# Patient Record
Sex: Female | Born: 1945 | Race: White | Hispanic: No | Smoking: Former smoker
Health system: Southern US, Community
[De-identification: ages and names within clinical notes are randomized; demographics above are authoritative.]

## PROBLEM LIST (undated history)

## (undated) DIAGNOSIS — I701 Atherosclerosis of renal artery: Secondary | ICD-10-CM

## (undated) DIAGNOSIS — I10 Essential (primary) hypertension: Secondary | ICD-10-CM

## (undated) DIAGNOSIS — I2510 Atherosclerotic heart disease of native coronary artery without angina pectoris: Secondary | ICD-10-CM

## (undated) DIAGNOSIS — F329 Major depressive disorder, single episode, unspecified: Secondary | ICD-10-CM

## (undated) DIAGNOSIS — C3410 Malignant neoplasm of upper lobe, unspecified bronchus or lung: Secondary | ICD-10-CM

## (undated) DIAGNOSIS — Z860100 Personal history of colon polyps, unspecified: Secondary | ICD-10-CM

## (undated) DIAGNOSIS — F32A Depression, unspecified: Secondary | ICD-10-CM

## (undated) DIAGNOSIS — G4733 Obstructive sleep apnea (adult) (pediatric): Secondary | ICD-10-CM

## (undated) DIAGNOSIS — I714 Abdominal aortic aneurysm, without rupture, unspecified: Secondary | ICD-10-CM

## (undated) DIAGNOSIS — M1990 Unspecified osteoarthritis, unspecified site: Secondary | ICD-10-CM

## (undated) DIAGNOSIS — J449 Chronic obstructive pulmonary disease, unspecified: Secondary | ICD-10-CM

## (undated) HISTORY — DX: Major depressive disorder, single episode, unspecified: F32.9

## (undated) HISTORY — PX: ABDOMINAL HYSTERECTOMY: SHX81

## (undated) HISTORY — DX: Malignant neoplasm of upper lobe, unspecified bronchus or lung: C34.10

## (undated) HISTORY — DX: Personal history of colon polyps, unspecified: Z86.0100

## (undated) HISTORY — DX: Abdominal aortic aneurysm, without rupture, unspecified: I71.40

## (undated) HISTORY — DX: Chronic obstructive pulmonary disease, unspecified: J44.9

## (undated) HISTORY — DX: Atherosclerotic heart disease of native coronary artery without angina pectoris: I25.10

## (undated) HISTORY — DX: Unspecified osteoarthritis, unspecified site: M19.90

## (undated) HISTORY — DX: Atherosclerosis of renal artery: I70.1

## (undated) HISTORY — DX: Obstructive sleep apnea (adult) (pediatric): G47.33

## (undated) HISTORY — DX: Abdominal aortic aneurysm, without rupture: I71.4

## (undated) HISTORY — PX: VESICOVAGINAL FISTULA CLOSURE W/ TAH: SUR271

## (undated) HISTORY — DX: Depression, unspecified: F32.A

## (undated) HISTORY — DX: Personal history of colonic polyps: Z86.010

---

## 2004-01-25 LAB — HM COLONOSCOPY

## 2007-03-29 ENCOUNTER — Emergency Department (HOSPITAL_COMMUNITY): Admission: EM | Admit: 2007-03-29 | Discharge: 2007-03-29 | Payer: Self-pay | Admitting: Emergency Medicine

## 2007-09-24 ENCOUNTER — Encounter: Admission: RE | Admit: 2007-09-24 | Discharge: 2007-09-24 | Payer: Self-pay | Admitting: Internal Medicine

## 2009-03-29 ENCOUNTER — Encounter: Admission: RE | Admit: 2009-03-29 | Discharge: 2009-04-27 | Payer: Self-pay | Admitting: Internal Medicine

## 2009-08-28 ENCOUNTER — Encounter: Admission: RE | Admit: 2009-08-28 | Discharge: 2009-08-28 | Payer: Self-pay | Admitting: Cardiovascular Disease

## 2009-08-30 ENCOUNTER — Inpatient Hospital Stay (HOSPITAL_COMMUNITY): Admission: RE | Admit: 2009-08-30 | Discharge: 2009-09-06 | Payer: Self-pay | Admitting: Cardiology

## 2009-08-30 ENCOUNTER — Ambulatory Visit: Payer: Self-pay | Admitting: Cardiothoracic Surgery

## 2009-08-30 HISTORY — PX: CARDIAC CATHETERIZATION: SHX172

## 2009-08-30 HISTORY — PX: CORONARY ARTERY BYPASS GRAFT: SHX141

## 2009-08-30 HISTORY — PX: OTHER SURGICAL HISTORY: SHX169

## 2009-09-26 ENCOUNTER — Ambulatory Visit: Payer: Self-pay | Admitting: Surgery

## 2009-09-26 ENCOUNTER — Encounter: Admission: RE | Admit: 2009-09-26 | Discharge: 2009-09-26 | Payer: Self-pay | Admitting: Surgery

## 2009-10-26 ENCOUNTER — Encounter (HOSPITAL_COMMUNITY): Admission: RE | Admit: 2009-10-26 | Discharge: 2009-11-17 | Payer: Self-pay | Admitting: Cardiovascular Disease

## 2009-11-18 ENCOUNTER — Encounter (HOSPITAL_COMMUNITY): Admission: RE | Admit: 2009-11-18 | Discharge: 2010-02-16 | Payer: Self-pay | Admitting: Cardiovascular Disease

## 2010-02-17 ENCOUNTER — Encounter (HOSPITAL_COMMUNITY): Admission: RE | Admit: 2010-02-17 | Discharge: 2010-03-19 | Payer: Self-pay | Admitting: Cardiovascular Disease

## 2010-06-14 ENCOUNTER — Encounter: Payer: Self-pay | Admitting: Internal Medicine

## 2010-07-29 ENCOUNTER — Encounter: Payer: Self-pay | Admitting: Internal Medicine

## 2010-08-04 ENCOUNTER — Emergency Department (HOSPITAL_COMMUNITY): Admission: EM | Admit: 2010-08-04 | Discharge: 2010-08-04 | Payer: Self-pay | Admitting: Emergency Medicine

## 2010-09-26 ENCOUNTER — Encounter: Payer: Self-pay | Admitting: Internal Medicine

## 2010-10-01 ENCOUNTER — Encounter: Payer: Self-pay | Admitting: Internal Medicine

## 2010-10-08 ENCOUNTER — Encounter: Payer: Self-pay | Admitting: Internal Medicine

## 2010-10-08 ENCOUNTER — Inpatient Hospital Stay (HOSPITAL_COMMUNITY): Admission: RE | Admit: 2010-10-08 | Discharge: 2010-10-11 | Payer: Self-pay | Admitting: Orthopedic Surgery

## 2010-10-26 ENCOUNTER — Inpatient Hospital Stay (HOSPITAL_COMMUNITY)
Admission: AD | Admit: 2010-10-26 | Discharge: 2010-10-30 | Payer: Self-pay | Source: Home / Self Care | Attending: Orthopedic Surgery | Admitting: Orthopedic Surgery

## 2010-10-29 ENCOUNTER — Ambulatory Visit: Payer: Self-pay | Admitting: Internal Medicine

## 2011-01-02 ENCOUNTER — Encounter: Payer: Self-pay | Admitting: Internal Medicine

## 2011-01-02 ENCOUNTER — Encounter (INDEPENDENT_AMBULATORY_CARE_PROVIDER_SITE_OTHER): Payer: Self-pay | Admitting: *Deleted

## 2011-01-02 ENCOUNTER — Institutional Professional Consult (permissible substitution) (INDEPENDENT_AMBULATORY_CARE_PROVIDER_SITE_OTHER): Payer: MEDICARE | Admitting: Internal Medicine

## 2011-01-02 ENCOUNTER — Other Ambulatory Visit: Payer: Self-pay | Admitting: Internal Medicine

## 2011-01-02 ENCOUNTER — Ambulatory Visit (INDEPENDENT_AMBULATORY_CARE_PROVIDER_SITE_OTHER): Payer: Medicare Other | Admitting: Internal Medicine

## 2011-01-02 ENCOUNTER — Other Ambulatory Visit: Payer: Medicare Other

## 2011-01-02 DIAGNOSIS — Z860100 Personal history of colon polyps, unspecified: Secondary | ICD-10-CM | POA: Insufficient documentation

## 2011-01-02 DIAGNOSIS — E119 Type 2 diabetes mellitus without complications: Secondary | ICD-10-CM

## 2011-01-02 DIAGNOSIS — E1129 Type 2 diabetes mellitus with other diabetic kidney complication: Secondary | ICD-10-CM | POA: Insufficient documentation

## 2011-01-02 DIAGNOSIS — F329 Major depressive disorder, single episode, unspecified: Secondary | ICD-10-CM | POA: Insufficient documentation

## 2011-01-02 DIAGNOSIS — F3289 Other specified depressive episodes: Secondary | ICD-10-CM | POA: Insufficient documentation

## 2011-01-02 DIAGNOSIS — F068 Other specified mental disorders due to known physiological condition: Secondary | ICD-10-CM | POA: Insufficient documentation

## 2011-01-02 DIAGNOSIS — I10 Essential (primary) hypertension: Secondary | ICD-10-CM

## 2011-01-02 DIAGNOSIS — J449 Chronic obstructive pulmonary disease, unspecified: Secondary | ICD-10-CM | POA: Insufficient documentation

## 2011-01-02 DIAGNOSIS — J4489 Other specified chronic obstructive pulmonary disease: Secondary | ICD-10-CM | POA: Insufficient documentation

## 2011-01-02 DIAGNOSIS — E785 Hyperlipidemia, unspecified: Secondary | ICD-10-CM

## 2011-01-02 DIAGNOSIS — J984 Other disorders of lung: Secondary | ICD-10-CM

## 2011-01-02 DIAGNOSIS — M1990 Unspecified osteoarthritis, unspecified site: Secondary | ICD-10-CM | POA: Insufficient documentation

## 2011-01-02 DIAGNOSIS — G4733 Obstructive sleep apnea (adult) (pediatric): Secondary | ICD-10-CM | POA: Insufficient documentation

## 2011-01-02 DIAGNOSIS — R911 Solitary pulmonary nodule: Secondary | ICD-10-CM

## 2011-01-02 DIAGNOSIS — E1165 Type 2 diabetes mellitus with hyperglycemia: Secondary | ICD-10-CM

## 2011-01-02 LAB — BASIC METABOLIC PANEL
BUN: 12 mg/dL (ref 6–23)
CO2: 27 mEq/L (ref 19–32)
Calcium: 10.3 mg/dL (ref 8.4–10.5)
Glucose, Bld: 100 mg/dL — ABNORMAL HIGH (ref 70–99)
Sodium: 140 mEq/L (ref 135–145)

## 2011-01-02 LAB — HEPATIC FUNCTION PANEL
ALT: 18 U/L (ref 0–35)
Alkaline Phosphatase: 54 U/L (ref 39–117)
Bilirubin, Direct: 0.1 mg/dL (ref 0.0–0.3)
Total Bilirubin: 0.6 mg/dL (ref 0.3–1.2)

## 2011-01-02 LAB — CONVERTED CEMR LAB
HDL goal, serum: 40 mg/dL
LDL Goal: 70 mg/dL

## 2011-01-03 LAB — CBC WITH DIFFERENTIAL/PLATELET
Eosinophils Relative: 0.9 % (ref 0.0–5.0)
Lymphocytes Relative: 30.1 % (ref 12.0–46.0)
Monocytes Absolute: 0.4 10*3/uL (ref 0.1–1.0)
Monocytes Relative: 5.1 % (ref 3.0–12.0)
Neutrophils Relative %: 63.5 % (ref 43.0–77.0)
Platelets: 234 10*3/uL (ref 150.0–400.0)
RBC: 4.87 Mil/uL (ref 3.87–5.11)
WBC: 7.2 10*3/uL (ref 4.5–10.5)

## 2011-01-04 ENCOUNTER — Other Ambulatory Visit: Payer: Medicare Other

## 2011-01-04 ENCOUNTER — Ambulatory Visit (INDEPENDENT_AMBULATORY_CARE_PROVIDER_SITE_OTHER)
Admission: RE | Admit: 2011-01-04 | Discharge: 2011-01-04 | Disposition: A | Discharge status: Home / Self Care | Payer: Medicare Other | Source: Ambulatory Visit | Attending: Internal Medicine | Admitting: Internal Medicine

## 2011-01-04 DIAGNOSIS — J984 Other disorders of lung: Secondary | ICD-10-CM

## 2011-01-04 DIAGNOSIS — R911 Solitary pulmonary nodule: Secondary | ICD-10-CM

## 2011-01-04 HISTORY — DX: Essential (primary) hypertension: I10

## 2011-01-04 MED ORDER — IOHEXOL 300 MG/ML  SOLN
80.0000 mL | Freq: Once | INTRAMUSCULAR | Status: AC | PRN
  Administered 2011-01-04: 80 mL via INTRAVENOUS

## 2011-01-06 ENCOUNTER — Encounter: Payer: Self-pay | Admitting: Internal Medicine

## 2011-01-08 ENCOUNTER — Telehealth: Payer: Self-pay | Admitting: Internal Medicine

## 2011-01-08 ENCOUNTER — Other Ambulatory Visit: Payer: Self-pay | Admitting: Internal Medicine

## 2011-01-08 DIAGNOSIS — Z1231 Encounter for screening mammogram for malignant neoplasm of breast: Secondary | ICD-10-CM

## 2011-01-08 DIAGNOSIS — R918 Other nonspecific abnormal finding of lung field: Secondary | ICD-10-CM

## 2011-01-09 NOTE — Assessment & Plan Note (Signed)
Summary: NEW MEDICARE PT--#--PKG--STC   Vital Signs:  Patient profile:   65 year old female Menstrual status:  hysterectomy Height:      62 inches Weight:      194.25 pounds BMI:     35.66 O2 Sat:      96 % on Room air Temp:     98.4 degrees F oral Pulse rate:   63 / minute Pulse rhythm:   regular Resp:     14 per minute BP sitting:   132 / 64  (left arm)  Vitals Entered By: Burnard Leigh CMA(AAMA) (January 02, 2011 10:57 AM)  Nutrition Counseling: Patient's BMI is greater than 25 and therefore counseled on weight management options.  O2 Flow:  Room air CC: New Pt transfer to establisn/sls, cma, Hypertension Management, Preventive Care, Lipid Management Is Patient Diabetic? Yes Did you bring your meter with you today? No Pain Assessment Patient in pain? no          Menstrual Status hysterectomy   Primary Care Provider:  jones  CC:  New Pt transfer to establisn/sls, cma, Hypertension Management, Preventive Care, and Lipid Management.  History of Present Illness:  Follow-Up Visit      This is a 65 year old woman who presents for Follow-up visit.  The patient denies chest pain, palpitations, dizziness, syncope, low blood sugar symptoms, high blood sugar symptoms, edema, SOB, DOE, PND, and orthopnea.  Since the last visit the patient notes no new problems or concerns.  The patient reports taking meds as prescribed, monitoring BP, monitoring blood sugars, and dietary compliance.  When questioned about possible medication side effects, the patient notes none.    Hypertension History:      She denies headache, chest pain, palpitations, dyspnea with exertion, orthopnea, PND, peripheral edema, visual symptoms, neurologic problems, syncope, and side effects from treatment.  She notes no problems with any antihypertensive medication side effects.        Positive major cardiovascular risk factors include female age 67 years old or older, diabetes, hyperlipidemia, and hypertension.   Negative major cardiovascular risk factors include non-tobacco-user status.        Positive history for target organ damage include ASHD (either angina/prior MI/prior CABG).  Further assessment for target organ damage reveals no history of cardiac end-organ damage (CHF/LVH), stroke/TIA, peripheral vascular disease, renal insufficiency, or hypertensive retinopathy.    Lipid Management History:      Positive NCEP/ATP III risk factors include female age 67 years old or older, diabetes, hypertension, and ASHD (either angina/prior MI/prior CABG).  Negative NCEP/ATP III risk factors include non-tobacco-user status, no prior stroke/TIA, no peripheral vascular disease, and no history of aortic aneurysm.        The patient states that she knows about the "Therapeutic Lifestyle Change" diet.  Her compliance with the TLC diet is poor.  The patient expresses understanding of adjunctive measures for cholesterol lowering.  Adjunctive measures started by the patient include fiber, niacin, omega-3 supplements, limit alcohol consumpton, and weight reduction.  She expresses no side effects from her lipid-lowering medication.  The patient denies any symptoms to suggest myopathy or liver disease.      Preventive Screening-Counseling & Management  Alcohol-Tobacco     Alcohol drinks/day: 0     Alcohol Counseling: not indicated; patient does not drink     Smoking Status: quit > 6 months     Year Quit: 2004     Tobacco Counseling: to remain off tobacco products  Caffeine-Diet-Exercise  Does Patient Exercise: no  Hep-HIV-STD-Contraception     Hepatitis Risk: no risk noted     HIV Risk: no risk noted     STD Risk: no risk noted      Sexual History:  not active.        Drug Use:  no.        Blood Transfusions:  no.    Current Medications (verified): 1)  Multivitamins  Tabs (Multiple Vitamin) .... Take 1 By Mouth Once Daily 2)  Potassium 99 Mg Tabs (Potassium) .... Take 1 By Mouth Once Daily 3)  Diovan 160  Mg Tabs (Valsartan) .... Take 1 By Mouth Once Daily 4)  Bystolic 10 Mg Tabs (Nebivolol Hcl) .... Take 1 By Mouth Once Daily 5)  Cymbalta 60 Mg Cpep (Duloxetine Hcl) .... Take 1 By Mouth Once Daily With 30mg  6)  Cymbalta 30 Mg Cpep (Duloxetine Hcl) .... Take 1 By Mouth Once Daily 7)  Metformin Hcl 500 Mg Tabs (Metformin Hcl) .... Take 1 By Mouth Two Times A Day 8)  Zetia 10 Mg Tabs (Ezetimibe) .... Take 1 By Mouth Once Daily 9)  Crestor 20 Mg Tabs (Rosuvastatin Calcium) .... Take 1 By Mouth Once Daily 10)  Niaspan 1000 Mg Cr-Tabs (Niacin (Antihyperlipidemic)) .... Take 1 By Mouth Once Daily 11)  Aspirin 81 Mg Tbec (Aspirin) .... Take 1 By Mouth Once Daily 12)  Vitamin C 500 Mg Tabs (Ascorbic Acid) .... Take 1 By Mouth Two Times A Day 13)  Proair Hfa 108 (90 Base) Mcg/act Aers (Albuterol Sulfate) .... 2 Puffs Four Times A Day As Needed 14)  Symbicort 160-4.5 Mcg/act Aero (Budesonide-Formoterol Fumarate) .... 2 Puffs Two Times A Day and Rinse Mouth After Use  Allergies (verified): No Known Drug Allergies  Past History:  Past Medical History: Colonic polyps, hx of COPD Dementia Depression Diabetes mellitus, type II Hyperlipidemia Hypertension Osteoarthritis AAA OSA RAS  Past Surgical History: Coronary artery bypass graft Multiple I and D's left hand s/p infected cat bite left hand November 2011 Hysterectomy  Family History: Family History of Arthritis Family History Depression Family History High cholesterol Family History Hypertension  Social History: Retired Divorced Alcohol use-no Drug use-no Regular exercise-no Drug Use:  no Does Patient Exercise:  no Smoking Status:  quit > 6 months Hepatitis Risk:  no risk noted HIV Risk:  no risk noted STD Risk:  no risk noted Sexual History:  not active Blood Transfusions:  no  Review of Systems       The patient complains of weight gain.  The patient denies anorexia, fever, weight loss, chest pain, syncope, dyspnea on  exertion, peripheral edema, prolonged cough, headaches, hemoptysis, abdominal pain, hematuria, muscle weakness, suspicious skin lesions, transient blindness, difficulty walking, enlarged lymph nodes, and angioedema.   Neuro:  Complains of memory loss; denies difficulty with concentration, disturbances in coordination, falling down, headaches, inability to speak, seizures, tingling, tremors, visual disturbances, and weakness. Endo:  Denies cold intolerance, excessive hunger, excessive thirst, excessive urination, heat intolerance, polyuria, and weight change.  Physical Exam  General:  alert, well-developed, well-nourished, well-hydrated, appropriate dress, normal appearance, healthy-appearing, cooperative to examination, good hygiene, and overweight-appearing.   Head:  normocephalic, atraumatic, no abnormalities observed, and no abnormalities palpated.   Eyes:  vision grossly intact and pupils equal.   Mouth:  Oral mucosa and oropharynx without lesions or exudates.  Teeth in good repair. Neck:  supple, full ROM, no masses, no thyromegaly, no thyroid nodules or tenderness, no JVD, normal carotid upstroke,  no carotid bruits, no cervical lymphadenopathy, and no neck tenderness.   Lungs:  normal respiratory effort, no intercostal retractions, no accessory muscle use, normal breath sounds, no dullness, no fremitus, no crackles, and no wheezes.   Heart:  normal rate, regular rhythm, no murmur, no gallop, no rub, and no JVD.   Abdomen:  soft, non-tender, normal bowel sounds, no distention, no masses, no guarding, no rigidity, no rebound tenderness, no abdominal hernia, no inguinal hernia, no hepatomegaly, and no splenomegaly.   Msk:  scab and scar left hand dorsum Pulses:  R and L carotid,radial,femoral,dorsalis pedis and posterior tibial pulses are full and equal bilaterally Extremities:  No clubbing, cyanosis, edema, or deformity noted with normal full range of motion of all joints.   Neurologic:  No  cranial nerve deficits noted. Station and gait are normal. Plantar reflexes are down-going bilaterally. DTRs are symmetrical throughout. Sensory, motor and coordinative functions appear intact. Skin:  Intact without suspicious lesions or rashes Cervical Nodes:  no anterior cervical adenopathy and no posterior cervical adenopathy.   Axillary Nodes:  no R axillary adenopathy and no L axillary adenopathy.   Psych:  Cognition and judgment appear intact. Alert and cooperative with normal attention span and concentration. No apparent delusions, illusions, hallucinations  Diabetes Management Exam:    Foot Exam (with socks and/or shoes not present):       Sensory-Pinprick/Light touch:          Left medial foot (L-4): normal          Left dorsal foot (L-5): normal          Left lateral foot (S-1): normal          Right medial foot (L-4): normal          Right dorsal foot (L-5): normal          Right lateral foot (S-1): normal       Sensory-Monofilament:          Left foot: normal          Right foot: normal       Inspection:          Left foot: normal          Right foot: normal       Nails:          Left foot: normal          Right foot: normal   Impression & Recommendations:  Problem # 1:  HYPERTENSION (ICD-401.9) Assessment Improved  Her updated medication list for this problem includes:    Diovan 160 Mg Tabs (Valsartan) .Marland Kitchen... Take 1 by mouth once daily    Bystolic 10 Mg Tabs (Nebivolol hcl) .Marland Kitchen... Take 1 by mouth once daily  Orders: Venipuncture (16109) TLB-CBC Platelet - w/Differential (85025-CBCD) TLB-Hepatic/Liver Function Pnl (80076-HEPATIC) TLB-TSH (Thyroid Stimulating Hormone) (84443-TSH) TLB-BMP (Basic Metabolic Panel-BMET) (80048-METABOL) TLB-A1C / Hgb A1C (Glycohemoglobin) (83036-A1C)  BP today: 132/64  10 Yr Risk Heart Disease: N/A  Problem # 2:  DIABETES MELLITUS, TYPE II (ICD-250.00) Assessment: Improved  Her updated medication list for this problem includes:     Diovan 160 Mg Tabs (Valsartan) .Marland Kitchen... Take 1 by mouth once daily    Metformin Hcl 500 Mg Tabs (Metformin hcl) .Marland Kitchen... Take 1 by mouth two times a day    Aspirin 81 Mg Tbec (Aspirin) .Marland Kitchen... Take 1 by mouth once daily  Orders: Venipuncture (60454) TLB-CBC Platelet - w/Differential (85025-CBCD) TLB-Hepatic/Liver Function Pnl (80076-HEPATIC) TLB-TSH (Thyroid Stimulating Hormone) (84443-TSH) TLB-BMP (  Basic Metabolic Panel-BMET) (80048-METABOL) TLB-A1C / Hgb A1C (Glycohemoglobin) (83036-A1C) Ophthalmology Referral (Ophthalmology)  Problem # 3:  HYPERLIPIDEMIA (ICD-272.4) Assessment: Unchanged  Her updated medication list for this problem includes:    Zetia 10 Mg Tabs (Ezetimibe) .Marland Kitchen... Take 1 by mouth once daily    Crestor 20 Mg Tabs (Rosuvastatin calcium) .Marland Kitchen... Take 1 by mouth once daily    Niaspan 1000 Mg Cr-tabs (Niacin (antihyperlipidemic)) .Marland Kitchen... Take 1 by mouth once daily  Orders: Venipuncture (16109) TLB-CBC Platelet - w/Differential (85025-CBCD) TLB-Hepatic/Liver Function Pnl (80076-HEPATIC) TLB-TSH (Thyroid Stimulating Hormone) (84443-TSH) TLB-BMP (Basic Metabolic Panel-BMET) (80048-METABOL) TLB-A1C / Hgb A1C (Glycohemoglobin) (83036-A1C)  10 Yr Risk Heart Disease: N/A  Complete Medication List: 1)  Multivitamins Tabs (Multiple vitamin) .... Take 1 by mouth once daily 2)  Potassium 99 Mg Tabs (Potassium) .... Take 1 by mouth once daily 3)  Diovan 160 Mg Tabs (Valsartan) .... Take 1 by mouth once daily 4)  Bystolic 10 Mg Tabs (Nebivolol hcl) .... Take 1 by mouth once daily 5)  Cymbalta 60 Mg Cpep (Duloxetine hcl) .... Take 1 by mouth once daily with 30mg  6)  Cymbalta 30 Mg Cpep (Duloxetine hcl) .... Take 1 by mouth once daily 7)  Metformin Hcl 500 Mg Tabs (Metformin hcl) .... Take 1 by mouth two times a day 8)  Zetia 10 Mg Tabs (Ezetimibe) .... Take 1 by mouth once daily 9)  Crestor 20 Mg Tabs (Rosuvastatin calcium) .... Take 1 by mouth once daily 10)  Niaspan 1000 Mg Cr-tabs  (Niacin (antihyperlipidemic)) .... Take 1 by mouth once daily 11)  Aspirin 81 Mg Tbec (Aspirin) .... Take 1 by mouth once daily 12)  Vitamin C 500 Mg Tabs (Ascorbic acid) .... Take 1 by mouth two times a day 13)  Proair Hfa 108 (90 Base) Mcg/act Aers (Albuterol sulfate) .... 2 puffs four times a day as needed 14)  Symbicort 160-4.5 Mcg/act Aero (Budesonide-formoterol fumarate) .... 2 puffs two times a day and rinse mouth after use  Other Orders: Radiology Referral (Radiology)  Hypertension Assessment/Plan:      The patient's hypertensive risk group is category C: Target organ damage and/or diabetes.  Today's blood pressure is 132/64.  Her blood pressure goal is < 130/80.  Lipid Assessment/Plan:      Based on NCEP/ATP III, the patient's risk factor category is "history of coronary disease, peripheral vascular disease, cerebrovascular disease, or aortic aneurysm along with either diabetes, current smoker, or LDL > 130 plus HDL < 40 plus triglycerides > 200".  The patient's lipid goals are as follows: Total cholesterol goal is 200; LDL cholesterol goal is 70; HDL cholesterol goal is 40; Triglyceride goal is 150.    Colorectal Screening:  Current Recommendations:    Colonoscopy recommended: patient defers today but will consider in the future  Colonoscopy Results:    Date of Exam: 01/25/2004    Results: Adenomatous Polyp  PAP Screening:    Hx Cervical Dysplasia in last 5 yrs? No    3 normal PAP smears in last 5 yrs? No    Reviewed PAP smear recommendations:  patient refuses understanding risks of delayed diagnosis  Mammogram Screening:    Reviewed Mammogram recommendations:  mammogram ordered  Osteoporosis Risk Assessment:  Risk Factors for Fracture or Low Bone Density:   Race (White or Asian):     yes   Smoking status:       quit > 6 months  Immunization & Chemoprophylaxis:    Influenza vaccine: Historical  (07/19/2009)  Pneumovax: Historical  (07/19/2010)  Patient  Instructions: 1)  Please schedule a follow-up appointment in 4 months. 2)  It is important that you exercise regularly at least 20 minutes 5 times a week. If you develop chest pain, have severe difficulty breathing, or feel very tired , stop exercising immediately and seek medical attention. 3)  You need to lose weight. Consider a lower calorie diet and regular exercise.  4)  Schedule your mammogram. 5)  You need to have a Pap Smear to prevent cervical cancer. 6)  Check your blood sugars regularly. If your readings are usually above 200 or below 70 you should contact our office. 7)  It is important that your Diabetic A1c level is checked every 3 months. 8)  See your eye doctor yearly to check for diabetic eye damage. 9)  Check your feet each night for sore areas, calluses or signs of infection. 10)  Check your Blood Pressure regularly. If it is above 130/80: you should make an appointment.   Orders Added: 1)  Venipuncture [36415] 2)  TLB-CBC Platelet - w/Differential [85025-CBCD] 3)  TLB-Hepatic/Liver Function Pnl [80076-HEPATIC] 4)  TLB-TSH (Thyroid Stimulating Hormone) [84443-TSH] 5)  TLB-BMP (Basic Metabolic Panel-BMET) [80048-METABOL] 6)  TLB-A1C / Hgb A1C (Glycohemoglobin) [83036-A1C] 7)  Ophthalmology Referral [Ophthalmology] 8)  Radiology Referral [Radiology] 9)  New Patient Level III [04540]   Immunization History:  Influenza Immunization History:    Influenza:  historical (07/19/2009)  Pneumovax Immunization History:    Pneumovax:  historical (07/19/2010)   Immunization History:  Influenza Immunization History:    Influenza:  Historical (07/19/2009)  Pneumovax Immunization History:    Pneumovax:  Historical (07/19/2010)

## 2011-01-09 NOTE — Assessment & Plan Note (Signed)
Summary: consult COPD/sh   Primary Provider/Referring Provider:  [name]  CC:  Pulmonary New Patient-SOB.  History of Present Illness: January 02, 2011- Pulmonary New Patient-SOB. 65 Yo F coming today with her daughter- a very helpful nurse at Dr Hazle Coca office, on kind referral from Dr Allyson Sabal for pulmonary evaluation of COPD and a lung nodule.  Former long-time smoker, dx'd with COPD in 1997 on hospitalization for acute exacerbation,  Aware of shortness of breath x severa years. but no actual evaluation. Worse in cold weather. DOE 100 feet or few steps or stairs. Little cough- dry, without wheeze, edema or chest pain. Recently hospitalized to deal with hand infected after cat bite. Anesthesiologist told her breathing was bad and she needed to see a pulmonary doctor. CXR 10/08/10- emphysema; RUL nodule>>recommend CT.  Using Symbicort 160, Proair. Spiriva helped but can't afford it.  Past hx pneumonia. Has had pneumovax more than once, but missed flu shot this year.  Seasonal allergic rhinitis only in childhood.  Hx obstructive sleep apnea, on BiPAP 15/10/ SMS, used all night, every night. Full face mask- Dr Tresa Endo. Coronary disease, no MI. Had CABG 2 v after interventional perf RCA. EF > 50%.   Preventive Screening-Counseling & Management  Alcohol-Tobacco     Smoking Status: quit     Packs/Day: 2.0     Year Started: 1962     Year Quit: 2004  Current Medications (verified): 1)  Multivitamins  Tabs (Multiple Vitamin) .... Take 1 By Mouth Once Daily 2)  Potassium 99 Mg Tabs (Potassium) .... Take 1 By Mouth Once Daily 3)  Diovan 160 Mg Tabs (Valsartan) .... Take 1 By Mouth Once Daily 4)  Bystolic 10 Mg Tabs (Nebivolol Hcl) .... Take 1 By Mouth Once Daily 5)  Cymbalta 60 Mg Cpep (Duloxetine Hcl) .... Take 1 By Mouth Once Daily With 30mg  6)  Cymbalta 30 Mg Cpep (Duloxetine Hcl) .... Take 1 By Mouth Once Daily 7)  Metformin Hcl 500 Mg Tabs (Metformin Hcl) .... Take 1 By Mouth Two Times  A Day 8)  Zetia 10 Mg Tabs (Ezetimibe) .... Take 1 By Mouth Once Daily 9)  Crestor 20 Mg Tabs (Rosuvastatin Calcium) .... Take 1 By Mouth Once Daily 10)  Niaspan 1000 Mg Cr-Tabs (Niacin (Antihyperlipidemic)) .... Take 1 By Mouth Once Daily 11)  Aspirin 81 Mg Tbec (Aspirin) .... Take 1 By Mouth Once Daily 12)  Vitamin C 500 Mg Tabs (Ascorbic Acid) .... Take 1 By Mouth Two Times A Day 13)  Proair Hfa 108 (90 Base) Mcg/act Aers (Albuterol Sulfate) .... 2 Puffs Four Times A Day As Needed 14)  Symbicort 160-4.5 Mcg/act Aero (Budesonide-Formoterol Fumarate) .... 2 Puffs Two Times A Day and Rinse Mouth After Use  Allergies (verified): No Known Drug Allergies  Past History:  Family History: Last updated: 01/02/2011 Emphysema-grandfather Mother- died MI Father- living, has RA  Social History: Last updated: 01/02/2011 Divorced; children- daughter is nurse for Dr Allyson Sabal cardiology Retired Catering manager- disabled by COPD, [facility] Living alone Patient states former smoker.   Risk Factors: Smoking Status: quit (01/02/2011) Packs/Day: 2.0 (01/02/2011)  Past Medical History: COPD Lung nodule- RUL- CT 01/04/11 CAD- CABG 2v 2010 Renal artery stenosis AAA Diabetes, Type 2 Hyperlipidemia Hypertension Obstructive sleep  apnea- managed by Dr Tresa Endo Depression  Past Surgical History: CABG 2v 2010 Total Abdominal Hysterectomy Right hand surgery x 4 after cat bite 2012  Family History: Emphysema-grandfather Mother- died MI Father- living, has RA  Social History: Divorced; children- daughter is  nurse for Dr Allyson Sabal cardiology Retired bookkeeper- disabled by COPD, [facility] Living alone Patient states former smoker.  Smoking Status:  quit Packs/Day:  2.0  Review of Systems      See HPI       The patient complains of shortness of breath with activity, productive cough, headaches, depression, hand/feet swelling, and joint stiffness or pain.  The patient denies shortness of breath at  rest, non-productive cough, coughing up blood, chest pain, irregular heartbeats, acid heartburn, indigestion, loss of appetite, weight change, abdominal pain, difficulty swallowing, sore throat, tooth/dental problems, nasal congestion/difficulty breathing through nose, sneezing, itching, ear ache, anxiety, rash, change in color of mucus, and fever.    Vital Signs:  Patient profile:   65 year old female Height:      64.5 inches Weight:      195.38 pounds BMI:     33.14 O2 Sat:      97 % on Room air Pulse rate:   61 / minute BP sitting:   124 / 76  (left arm) Cuff size:   regular  Vitals Entered By: Reynaldo Minium CMA (January 02, 2011 9:31 AM)  O2 Flow:  Room air CC: Pulmonary New Patient-SOB   Physical Exam  Additional Exam:  General: A/Ox3; pleasant and cooperative, NAD, overweight   RA O2 sat 97% at rest SKIN: bandage dorsum left hand NODES: no lymphadenopathy HEENT: [place]/AT, EOM- WNL, Conjuctivae- clear, PERRLA, TM-WNL, Nose- clear, Throat- clear and wnl, own teeth, tonsils, Mallampati  II NECK: Supple w/ fair ROM, JVD- none, normal carotid impulses w/o bruits Thyroid- normal to palpation CHEST: decreased breath sounds, raspy cough HEART: RRR, no m/g/r heard ABDOMEN: Soft and nl; nml bowel sounds; no organomegaly or masses noted FAO:ZHYQ, nl pulses, trace edema  NEURO: Grossly intact to observation- reportedly decreased memory      Impression & Recommendations:  Problem # 1:  COPD (ICD-496) Hx and exam support a dx of COPD- emphysema pattern. She noted real benefit from Spiriva but couldn't afford it a year ago.We will reassess, and see if there is assistance available. We will get PFT and refer to Pulmonary Rehab, noting that she had done cardiac rehab in the past. She has been using Symbicort only once daily due to cost. I suggested that she go back up to two times a day any time at first sign of a chest cold or etc.   Problem # 2:  LUNG NODULE (ICD-518.89)  Nodule  first described on CXR 10/08/10 in R[facility]. We will get CT. Daughter says renal function normal in November- pertinent to contrast.  Medications Added to Medication List This Visit: 1)  Multivitamins Tabs (Multiple vitamin) .... Take 1 by mouth once daily 2)  Potassium 99 Mg Tabs (Potassium) .... Take 1 by mouth once daily 3)  Diovan 160 Mg Tabs (Valsartan) .... Take 1 by mouth once daily 4)  Bystolic 10 Mg Tabs (Nebivolol hcl) .... Take 1 by mouth once daily 5)  Cymbalta 60 Mg Cpep (Duloxetine hcl) .... Take 1 by mouth once daily with 30mg  6)  Cymbalta 30 Mg Cpep (Duloxetine hcl) .... Take 1 by mouth once daily 7)  Metformin Hcl 500 Mg Tabs (Metformin hcl) .... Take 1 by mouth two times a day 8)  Zetia 10 Mg Tabs (Ezetimibe) .... Take 1 by mouth once daily 9)  Crestor 20 Mg Tabs (Rosuvastatin calcium) .... Take 1 by mouth once daily 10)  Niaspan 1000 Mg Cr-tabs (Niacin (antihyperlipidemic)) .... Take 1 by  mouth once daily 11)  Aspirin 81 Mg Tbec (Aspirin) .... Take 1 by mouth once daily 12)  Vitamin C 500 Mg Tabs (Ascorbic acid) .... Take 1 by mouth two times a day 13)  Proair Hfa 108 (90 Base) Mcg/act Aers (Albuterol sulfate) .... 2 puffs four times a day as needed 14)  Symbicort 160-4.5 Mcg/act Aero (Budesonide-formoterol fumarate) .... 2 puffs two times a day and rinse mouth after use 15)  Spiriva Handihaler 18 Mcg Caps (Tiotropium bromide monohydrate) .Marland Kitchen.. 1 daily  Other Orders: Consultation Level IV (99244) TLB-BMP (Basic Metabolic Panel-BMET) (80048-METABOL) DME Referral (DME) Radiology Referral (Radiology) Rehabilitation Referral (Rehab)  Patient Instructions: 1)  Please schedule a follow-up appointment in 1 month. 2)  Sample and script for Spiriva- 1 daily 3)  We will see if you could qualify for any assistance with this.  4)  See [facility] to see about Pulmonary rehab, availability of assistance with Spiriva and to schedule the CT 5)  A Chest CT with Contrast has been recommended.   Your imaging study may require preauthorization.  6)  Schedule PFT 7)  If you are having a bad stretch with your breathing, like a chest cold or etc, you can go back up to twice daily with Symbicort until you feel better, then drop back to once daily. 8)  cc Dr Tresa Endo Prescriptions: SPIRIVA HANDIHALER 18 MCG CAPS (TIOTROPIUM BROMIDE MONOHYDRATE) 1 daily  #30 x prn   Entered and Authorized by:   Waymon Budge MD   Signed by:   Waymon Budge MD on 01/02/2011   Method used:   Print then Give to Patient   RxID:   [id number]

## 2011-01-10 ENCOUNTER — Encounter (INDEPENDENT_AMBULATORY_CARE_PROVIDER_SITE_OTHER): Payer: Medicare Other

## 2011-01-10 ENCOUNTER — Encounter: Payer: Self-pay | Admitting: Internal Medicine

## 2011-01-10 DIAGNOSIS — J449 Chronic obstructive pulmonary disease, unspecified: Secondary | ICD-10-CM

## 2011-01-13 ENCOUNTER — Telehealth: Payer: Self-pay | Admitting: Internal Medicine

## 2011-01-15 ENCOUNTER — Encounter: Payer: Self-pay | Admitting: Internal Medicine

## 2011-01-15 NOTE — Letter (Signed)
Summary: Medical Hx/Patient  Medical Hx/Patient   Imported By: Sherian Rein 01/10/2011 09:16:59  _____________________________________________________________________  External Attachment:    Type:   Image     Comment:   External Document

## 2011-01-15 NOTE — Progress Notes (Signed)
Summary: Daughter/ Pt  to discuss lung nodule and order PET  Phone Note Other Incoming   Summary of Call: The daughter, Rosana Berger, RN, who was with mother at initial visit, called asking discussion of CT. We went over results and explanation of report. I suggested we go ahead and schedule PET ahead of the next visit schecduled OV March 16. I called patient to offer same recommendation. She agrees. Initial call taken by: Waymon Budge MD,  January 08, 2011 12:57 PM

## 2011-01-15 NOTE — Letter (Signed)
Summary: [facility]  [facility]   Imported By: Sherian Rein 01/10/2011 09:15:46  _____________________________________________________________________  External Attachment:    Type:   Image     Comment:   External Document

## 2011-01-15 NOTE — Letter (Signed)
Summary: Results Follow-up Letter  [facility]  [address] [place], [place] [address]   Phone: [phone]  Fax: [phone]    01/06/2011  [address] [place], [place]  [address]  [place]  Dear Ms. KESSELMAN,   The following are the results of your recent test(s):  Test     Result     CBC       mildly anemia Liver/kidney   normal Thyroid     normal  _________________________________________________________  Please call for an appointment soon _________________________________________________________ _________________________________________________________ _________________________________________________________  Sincerely,  [name] MD [facility]

## 2011-01-15 NOTE — Progress Notes (Signed)
Summary: would like to speak to the nurse re:her mothers ct results  Phone Note Call from Patient   Caller: daughter/katherine vogel Call For: Oumou Smead Summary of Call: patient daughter Rosana Berger phoned stated that Florentina Addison had called her mother with her Ct result and she would like to speak to [facility] regarding this She can be reached at [phone] Initial call taken by: Vedia Coffer,  January 08, 2011 10:18 AM  Follow-up for Phone Call        PT 's daughter would like Dr Ysidra Sopher to call her regarding CT results.  She has a few questions before pt's appt on 02-01-11. Abigail Miyamoto RN  January 08, 2011 10:57 AM   Additional Follow-up for Phone Call Additional follow up Details #1::        Done Additional Follow-up by: Waymon Budge MD,  January 08, 2011 1:03 PM

## 2011-01-21 ENCOUNTER — Encounter (HOSPITAL_COMMUNITY)
Admission: RE | Admit: 2011-01-21 | Discharge: 2011-01-21 | Disposition: A | Discharge status: Home / Self Care | Payer: Medicare Other | Source: Ambulatory Visit | Attending: Internal Medicine | Admitting: Internal Medicine

## 2011-01-21 DIAGNOSIS — R918 Other nonspecific abnormal finding of lung field: Secondary | ICD-10-CM

## 2011-01-21 DIAGNOSIS — C3410 Malignant neoplasm of upper lobe, unspecified bronchus or lung: Secondary | ICD-10-CM | POA: Insufficient documentation

## 2011-01-21 DIAGNOSIS — J449 Chronic obstructive pulmonary disease, unspecified: Secondary | ICD-10-CM | POA: Insufficient documentation

## 2011-01-21 DIAGNOSIS — J4489 Other specified chronic obstructive pulmonary disease: Secondary | ICD-10-CM | POA: Insufficient documentation

## 2011-01-21 MED ORDER — FLUDEOXYGLUCOSE F - 18 (FDG) INJECTION
15.6000 | Freq: Once | INTRAVENOUS | Status: AC | PRN
  Administered 2011-01-21: 15.6 via INTRAVENOUS

## 2011-01-23 ENCOUNTER — Ambulatory Visit
Admission: RE | Admit: 2011-01-23 | Discharge: 2011-01-23 | Disposition: A | Discharge status: Home / Self Care | Payer: Medicare Other | Source: Ambulatory Visit | Attending: Internal Medicine | Admitting: Internal Medicine

## 2011-01-23 DIAGNOSIS — Z1231 Encounter for screening mammogram for malignant neoplasm of breast: Secondary | ICD-10-CM

## 2011-01-23 LAB — HM MAMMOGRAPHY: HM Mammogram: NORMAL

## 2011-01-24 ENCOUNTER — Telehealth: Payer: Self-pay | Admitting: Internal Medicine

## 2011-01-24 NOTE — Progress Notes (Signed)
Summary: SMS reports patient using BiPAP 15/10  Phone Note Other Incoming   Summary of Call: SMS reports patient is fully compliant with BIPAP 15/10, AHI 0/hr. Initial call taken by: Waymon Budge MD,  January 13, 2011 12:02 PM    New/Updated Medications: * BIPAP 15/10 SMS

## 2011-01-24 NOTE — Assessment & Plan Note (Signed)
Summary: pft //kp   Allergies: No Known Drug Allergies   Other Orders: Carbon Monoxide diffusing w/capacity ([address]) Lung Volumes/Gas dilution or washout ([address]) Spirometry (Pre & Post) [phone])

## 2011-01-24 NOTE — Letter (Signed)
Summary: [facility]  [facility]   Imported By: Sherian Rein 01/17/2011 12:58:52  _____________________________________________________________________  External Attachment:    Type:   Image     Comment:   External Document

## 2011-01-25 ENCOUNTER — Other Ambulatory Visit: Payer: Self-pay | Admitting: Internal Medicine

## 2011-01-25 DIAGNOSIS — R911 Solitary pulmonary nodule: Secondary | ICD-10-CM

## 2011-01-28 ENCOUNTER — Ambulatory Visit: Payer: Medicare Other | Admitting: Internal Medicine

## 2011-01-29 LAB — BASIC METABOLIC PANEL
BUN: 11 mg/dL (ref 6–23)
BUN: 12 mg/dL (ref 6–23)
Calcium: 9.5 mg/dL (ref 8.4–10.5)
Creatinine, Ser: 0.91 mg/dL (ref 0.4–1.2)
GFR calc Af Amer: >60 mL/min (ref >60)
GFR calc non Af Amer: >60 mL/min (ref >60)
Potassium: 5.1 mEq/L (ref 3.5–5.1)
Sodium: 138 mEq/L (ref 135–145)

## 2011-01-29 LAB — CBC
HCT: 39.9 % (ref 36.0–46.0)
Hemoglobin: 11.9 g/dL — ABNORMAL LOW (ref 12.0–15.0)
Platelets: 175 10*3/uL (ref 150–400)
RBC: 4.77 MIL/uL (ref 3.87–5.11)
RDW: 13.4 % (ref 11.5–15.5)
RDW: 13.7 % (ref 11.5–15.5)
WBC: 20.9 10*3/uL — ABNORMAL HIGH (ref 4.0–10.5)
WBC: 7.8 10*3/uL (ref 4.0–10.5)

## 2011-01-29 LAB — GLUCOSE, CAPILLARY
Glucose-Capillary: 106 mg/dL — ABNORMAL HIGH (ref 70–99)
Glucose-Capillary: 111 mg/dL — ABNORMAL HIGH (ref 70–99)
Glucose-Capillary: 124 mg/dL — ABNORMAL HIGH (ref 70–99)
Glucose-Capillary: 129 mg/dL — ABNORMAL HIGH (ref 70–99)
Glucose-Capillary: 143 mg/dL — ABNORMAL HIGH (ref 70–99)
Glucose-Capillary: 148 mg/dL — ABNORMAL HIGH (ref 70–99)
Glucose-Capillary: 84 mg/dL (ref 70–99)
Glucose-Capillary: 86 mg/dL (ref 70–99)
Glucose-Capillary: 88 mg/dL (ref 70–99)
Glucose-Capillary: 90 mg/dL (ref 70–99)
Glucose-Capillary: 92 mg/dL (ref 70–99)
Glucose-Capillary: 93 mg/dL (ref 70–99)
Glucose-Capillary: 94 mg/dL (ref 70–99)
Glucose-Capillary: 94 mg/dL (ref 70–99)
Glucose-Capillary: 95 mg/dL (ref 70–99)
Glucose-Capillary: 97 mg/dL (ref 70–99)
Glucose-Capillary: 98 mg/dL (ref 70–99)
Glucose-Capillary: 98 mg/dL (ref 70–99)

## 2011-01-29 LAB — ANAEROBIC CULTURE

## 2011-01-29 LAB — SURGICAL PCR SCREEN: Staphylococcus aureus: NEGATIVE

## 2011-01-29 LAB — WOUND CULTURE

## 2011-01-29 NOTE — Progress Notes (Signed)
    Mammogram Screening:    Last Mammogram:  01/23/2011  Mammogram Results:    Date of Exam:  01/23/2011    Results:  Normal Bilateral  Osteoporosis Risk Assessment:  Risk Factors for Fracture or Low Bone Density:   Race (White or Asian):     yes   Smoking status:       quit > 6 months  Immunization & Chemoprophylaxis:    Influenza vaccine: Historical  (07/19/2009)    Pneumovax: Historical  (07/19/2010)

## 2011-01-30 ENCOUNTER — Ambulatory Visit (HOSPITAL_COMMUNITY)
Admission: RE | Admit: 2011-01-30 | Discharge: 2011-01-30 | Disposition: A | Discharge status: Home / Self Care | Payer: Medicare Other | Source: Ambulatory Visit | Attending: Internal Medicine | Admitting: Internal Medicine

## 2011-01-30 ENCOUNTER — Ambulatory Visit (HOSPITAL_COMMUNITY)
Admission: RE | Admit: 2011-01-30 | Discharge: 2011-01-30 | Disposition: A | Discharge status: Home / Self Care | Payer: Medicare Other | Source: Ambulatory Visit | Attending: Interventional Radiology | Admitting: Interventional Radiology

## 2011-01-30 ENCOUNTER — Other Ambulatory Visit: Payer: Self-pay | Admitting: Interventional Radiology

## 2011-01-30 DIAGNOSIS — C3410 Malignant neoplasm of upper lobe, unspecified bronchus or lung: Secondary | ICD-10-CM | POA: Insufficient documentation

## 2011-01-30 DIAGNOSIS — R911 Solitary pulmonary nodule: Secondary | ICD-10-CM

## 2011-01-30 DIAGNOSIS — I10 Essential (primary) hypertension: Secondary | ICD-10-CM | POA: Insufficient documentation

## 2011-01-30 DIAGNOSIS — Z87891 Personal history of nicotine dependence: Secondary | ICD-10-CM | POA: Insufficient documentation

## 2011-01-30 DIAGNOSIS — J4489 Other specified chronic obstructive pulmonary disease: Secondary | ICD-10-CM | POA: Insufficient documentation

## 2011-01-30 DIAGNOSIS — J449 Chronic obstructive pulmonary disease, unspecified: Secondary | ICD-10-CM | POA: Insufficient documentation

## 2011-01-30 DIAGNOSIS — I2510 Atherosclerotic heart disease of native coronary artery without angina pectoris: Secondary | ICD-10-CM | POA: Insufficient documentation

## 2011-01-30 DIAGNOSIS — Z01812 Encounter for preprocedural laboratory examination: Secondary | ICD-10-CM | POA: Insufficient documentation

## 2011-01-30 DIAGNOSIS — E119 Type 2 diabetes mellitus without complications: Secondary | ICD-10-CM | POA: Insufficient documentation

## 2011-01-30 DIAGNOSIS — Z01818 Encounter for other preprocedural examination: Secondary | ICD-10-CM | POA: Insufficient documentation

## 2011-01-30 LAB — CBC
HCT: 39 % (ref 36.0–46.0)
MCH: 23.3 pg — ABNORMAL LOW (ref 26.0–34.0)
MCHC: 30.3 g/dL (ref 30.0–36.0)
RDW: 15.3 % (ref 11.5–15.5)

## 2011-01-30 LAB — PROTIME-INR
INR: 1.03 (ref 0.00–1.49)
Prothrombin Time: 13.7 seconds (ref 11.6–15.2)

## 2011-01-30 LAB — GLUCOSE, CAPILLARY: Glucose-Capillary: 117 mg/dL — ABNORMAL HIGH (ref 70–99)

## 2011-01-31 LAB — CBC
HCT: 40.7 % (ref 36.0–46.0)
MCH: 26.7 pg (ref 26.0–34.0)
MCV: 82.2 fL (ref 78.0–100.0)
Platelets: 204 10*3/uL (ref 150–400)
RBC: 4.95 MIL/uL (ref 3.87–5.11)

## 2011-01-31 LAB — URINALYSIS, ROUTINE W REFLEX MICROSCOPIC
Nitrite: NEGATIVE
Protein, ur: NEGATIVE mg/dL
Urobilinogen, UA: 0.2 mg/dL (ref 0.0–1.0)

## 2011-01-31 LAB — COMPREHENSIVE METABOLIC PANEL
Albumin: 4.2 g/dL (ref 3.5–5.2)
Alkaline Phosphatase: 50 U/L (ref 39–117)
BUN: 11 mg/dL (ref 6–23)
Chloride: 106 mEq/L (ref 96–112)
Creatinine, Ser: 0.79 mg/dL (ref 0.4–1.2)
GFR calc non Af Amer: >60 mL/min (ref >60)
Glucose, Bld: 83 mg/dL (ref 70–99)
Potassium: 4.2 mEq/L (ref 3.5–5.1)
Total Bilirubin: 0.7 mg/dL (ref 0.3–1.2)

## 2011-01-31 LAB — URINE MICROSCOPIC-ADD ON

## 2011-01-31 LAB — DIFFERENTIAL
Basophils Absolute: 0.1 10*3/uL (ref 0.0–0.1)
Basophils Relative: 1 % (ref 0–1)
Eosinophils Absolute: 0.1 10*3/uL (ref 0.0–0.7)
Neutrophils Relative %: 48 % (ref 43–77)

## 2011-02-01 ENCOUNTER — Ambulatory Visit: Payer: Medicare Other | Admitting: Internal Medicine

## 2011-02-03 LAB — GLUCOSE, CAPILLARY: Glucose-Capillary: 88 mg/dL (ref 70–99)

## 2011-02-04 ENCOUNTER — Encounter: Payer: Self-pay | Admitting: Internal Medicine

## 2011-02-04 ENCOUNTER — Ambulatory Visit (INDEPENDENT_AMBULATORY_CARE_PROVIDER_SITE_OTHER): Payer: Medicare Other | Admitting: Internal Medicine

## 2011-02-04 DIAGNOSIS — C3410 Malignant neoplasm of upper lobe, unspecified bronchus or lung: Secondary | ICD-10-CM

## 2011-02-04 DIAGNOSIS — G4733 Obstructive sleep apnea (adult) (pediatric): Secondary | ICD-10-CM

## 2011-02-04 DIAGNOSIS — J449 Chronic obstructive pulmonary disease, unspecified: Secondary | ICD-10-CM

## 2011-02-04 HISTORY — DX: Malignant neoplasm of upper lobe, unspecified bronchus or lung: C34.10

## 2011-02-06 ENCOUNTER — Telehealth: Payer: Self-pay | Admitting: Internal Medicine

## 2011-02-06 LAB — GLUCOSE, CAPILLARY: Glucose-Capillary: 83 mg/dL (ref 70–99)

## 2011-02-12 ENCOUNTER — Encounter (INDEPENDENT_AMBULATORY_CARE_PROVIDER_SITE_OTHER): Payer: Medicare Other | Admitting: Surgery

## 2011-02-12 DIAGNOSIS — C3490 Malignant neoplasm of unspecified part of unspecified bronchus or lung: Secondary | ICD-10-CM

## 2011-02-13 ENCOUNTER — Encounter (HOSPITAL_COMMUNITY)
Admission: RE | Admit: 2011-02-13 | Discharge: 2011-02-13 | Disposition: A | Discharge status: Home / Self Care | Payer: Medicare Other | Source: Ambulatory Visit | Attending: Surgery | Admitting: Surgery

## 2011-02-13 ENCOUNTER — Other Ambulatory Visit: Payer: Self-pay | Admitting: Surgery

## 2011-02-13 ENCOUNTER — Ambulatory Visit (HOSPITAL_COMMUNITY)
Admission: RE | Admit: 2011-02-13 | Discharge: 2011-02-13 | Disposition: A | Discharge status: Home / Self Care | Payer: Medicare Other | Source: Ambulatory Visit | Attending: Surgery | Admitting: Surgery

## 2011-02-13 DIAGNOSIS — C3490 Malignant neoplasm of unspecified part of unspecified bronchus or lung: Secondary | ICD-10-CM | POA: Insufficient documentation

## 2011-02-13 DIAGNOSIS — Z01818 Encounter for other preprocedural examination: Secondary | ICD-10-CM | POA: Insufficient documentation

## 2011-02-13 DIAGNOSIS — Z01812 Encounter for preprocedural laboratory examination: Secondary | ICD-10-CM | POA: Insufficient documentation

## 2011-02-13 DIAGNOSIS — Z01810 Encounter for preprocedural cardiovascular examination: Secondary | ICD-10-CM | POA: Insufficient documentation

## 2011-02-13 LAB — COMPREHENSIVE METABOLIC PANEL
Alkaline Phosphatase: 54 U/L (ref 39–117)
BUN: 15 mg/dL (ref 6–23)
Calcium: 10.3 mg/dL (ref 8.4–10.5)
GFR calc non Af Amer: >60 mL/min (ref >60)
Glucose, Bld: 124 mg/dL — ABNORMAL HIGH (ref 70–99)
Total Protein: 6.6 g/dL (ref 6.0–8.3)

## 2011-02-13 LAB — URINALYSIS, ROUTINE W REFLEX MICROSCOPIC
Nitrite: NEGATIVE
Protein, ur: 30 mg/dL — AB
Urobilinogen, UA: 1 mg/dL (ref 0.0–1.0)

## 2011-02-13 LAB — CBC
Hemoglobin: 12.9 g/dL (ref 12.0–15.0)
MCHC: 30.4 g/dL (ref 30.0–36.0)
RDW: 16.4 % — ABNORMAL HIGH (ref 11.5–15.5)

## 2011-02-13 LAB — APTT: aPTT: 26 seconds (ref 24–37)

## 2011-02-13 LAB — URINE MICROSCOPIC-ADD ON

## 2011-02-13 LAB — TYPE AND SCREEN: ABO/RH(D): B POS

## 2011-02-13 LAB — SURGICAL PCR SCREEN: Staphylococcus aureus: NEGATIVE

## 2011-02-13 NOTE — Consult Note (Signed)
NEW PATIENT CONSULTATION  Veronica Duarte, Veronica Duarte DOB:  Jul 07, 1946                                        February 12, 2011 CHART #:  [id number]  REASON FOR CONSULTATION:  Right upper lobe non-small-cell lung cancer.  CLINICAL HISTORY:  I was asked by Dr. Maple Hudson to evaluate the patient for consideration of surgical resection of a small peripheral right upper lobe non-small-cell lung cancer.  She is a 65 year old previous smoker with severe COPD who I know from emergent coronary artery bypass graft surgery on August 30, 2009, after failed PTCA of the right coronary artery.  She did well after that surgery.  Her daughter is a Engineer, civil (consulting) in Dr. Clayborne Dana office.  The patient and her daughter relate that in November 2011 she sustained a small puncture wound on the back of her left hand from her cat.  Within a couple of days, this had become grossly infected requiring open surgical drainage and debridement by Dr. Melvyn Novas.  She said that she required 4-5 surgical procedures ultimately for drainage and debridement and this was treated with open VAC drainage until complete closure in February.  She apparently had a chest x-ray performed for her preop evaluation due to severe COPD and this showed a small peripheral right upper lobe lung nodule.  CT scan was obtained and showed this 1.2-cm peripheral right upper lobe lung nodule.  There were no other abnormalities seen.  She had a PET scan done on January 21, 2011, which showed hypermetabolic uptake within this lung nodule with an SUV of 7.7.  There was no other abnormal activity within the mediastinum or hilum.  There was no abdominal or pelvic hypermetabolism.  She had a CT scan of the head back in September 2011 which was negative for any metastatic disease and had been performed as part of a dementia workup. She subsequently underwent CT-guided needle biopsy of this right upper lobe lung mass which showed non-small cell carcinoma  mostly compatible with poorly differentiated adenocarcinoma.  REVIEW OF SYSTEMS:  She denies any fever or chills.  She has had no recent weight changes.  She denies fatigue. EYES:  Negative. ENT:  Negative. ENDOCRINE:  She has adult-onset diabetes.  She denies hypothyroidism. CARDIOVASCULAR:  She denies any chest pain or pressure.  She does have exertional dyspnea with mild activity which is chronic.  She denies PND and orthopnea.  She had no peripheral edema or palpitations. RESPIRATORY:  She denies cough and sputum production.  She has had no hemoptysis. GI:  She denies nausea or vomiting.  She denies melena and bright red blood per rectum. GU:  She denies dysuria and hematuria. MUSCULOSKELETAL:  She does have arthritis. VASCULAR:  She denies phlebitis.  She does have pain in her legs with walking.  She never had DVT. NEUROLOGIC:  She denies any focal weakness or numbness.  She denies dizziness and syncope.  She denies any history of TIA or stroke. However, her daughter noticed some mild memory loss and she has undergone workup for mild early dementia. HEMATOLOGIC:  She denies any history of bleeding disorders or easy bleeding.  ALLERGIES:  None.  PAST MEDICAL HISTORY:  Significant for adult-onset diabetes, hypertension, hypercholesterolemia, and COPD.  She has a history of obstructive sleep apnea and uses BiPAP at night.  She has a history of depression.  History of osteoarthritis.  History of abdominal aortic aneurysm.  History of renal artery stenosis.  She is status post multiple surgeries for infected left hand after a cat bite in November 2011.  Status post hysterectomy.  Status post coronary artery bypass surgery as mentioned above.  MEDICATIONS: 1. Multivitamin daily. 2. Potassium 99 mg daily. 3. Diovan 160 mg daily. 4. Bystolic 10 mg daily. 5. Cymbalta 60 mg daily. 6. Metformin 500 mg b.i.d. 7. Zetia 10 mg daily. 8. Crestor 20 mg daily. 9. Niaspan 1000 mg  daily. 10.Aspirin 81 mg daily. 11.Vitamin C 500 mg b.i.d. 12.ProAir HFA p.r.n. 13.Symbicort 160/4.5 mcg 2 puffs t.i.d. 14.Spiriva 18 mcg daily. 15.She uses BiPAP 15/10 SMS.  FAMILY HISTORY:  Negative for lung cancer.  SOCIAL HISTORY:  She is single and retired.  She has 2 children.  She is a remote smoker.  She denies alcohol abuse.  PHYSICAL EXAMINATION:  VITAL SIGNS:  Her blood pressure is 138/88, pulse 84 and regular, respiratory rate 16 and unlabored.  Oxygen saturation on room air is 96%.  GENERAL:  She is an obese white female in no distress. HEENT:  Normocephalic and atraumatic.  Pupils are equal and reactive to light and accommodation.  Extraocular muscles are intact.  Oropharynx is clear.  NECK:  Normal carotid pulses bilaterally.  There are no bruits. There is no cervical or supraclavicular adenopathy.  CHEST:  There is a well-healed sternotomy incision.  Sternum is stable.  CARDIAC:  Regular rate and rhythm with normal S1 and S2.  There is no murmur, rub, or gallop.  LUNGS:  Clear.  ABDOMEN:  Active bowel sounds.  Abdomen is soft, obese, and nontender.  There are no palpable masses or organomegaly.  EXTREMITIES:  No peripheral edema.  Pedal pulses bilaterally.  SKIN:  Warm and dry.  NEUROLOGIC:  Alert and oriented x3. Motor and sensory exams are grossly normal.  Pulmonary function testing shows an FEV-1 of 0.94 increased to 1.0 after bronchodilators.  This is 44% predicted.  FVC is 2.08 which is 71% predicted.  This improved to 2.56 which is 88% predicted after bronchodilators.  Diffusion capacity was 45% predicted.  This corrected to 75% predicted.  These were consistent with severe obstructive lung disease with a slight response to bronchodilator and air trapping. There is some moderately reduced diffusion capacity.  IMPRESSION:  The patient has a 1.2-cm right upper lobe non-small-cell lung cancer.  This most likely is adenocarcinoma with no sign of other disease by  PET scan.  She has severe obstructive lung disease and would not be a candidate for lobar resection.  Since this is a peripheral lesion, I think it could be resected with wedge resection and I think she would tolerate that.  She does have a history of cardiac disease but has no cardiac symptoms at this time and had a negative nuclear exam a few months ago.  I discussed the treatment options including wedge resection of the lesion as well as SBRT.  I also discussed the current study randomizing her to radiation or surgical treatment given her severe pulmonary disease.  She said that she is not interested in radiation therapy and does not want to be in the study and would like to proceed with wedge resection.  Hopefully, we can perform this thoracoscopically but I explained her that if she has significant adhesions we may need to proceed with small thoracotomy for resection. Dr. Maple Hudson feels that her lungs are in as good as shape possible and that she should tolerate  a wedge resection.  I would agree with that assessment.  I discussed the benefits and risks including but not limited to bleeding, blood transfusion, infection, prolonged air leak, respiratory failure, organ dysfunction, and death.  I also discussed possibility of incomplete resection and the possibility that there could be microscopic lymph node metastasis that we do not know about yet.  She understands all of this and agrees to proceed.  We will plan to do this on Thursday, February 14, 2011.  Evelene Croon, M.D. Electronically Signed  BB/MEDQ  D:  02/12/2011  T:  02/13/2011  Job:  [id number]  cc:   Joni Fears D. Maple Hudson, MD, FCCP, FACP Nanetta Batty, M.D.

## 2011-02-14 ENCOUNTER — Telehealth: Payer: Self-pay | Admitting: Internal Medicine

## 2011-02-14 ENCOUNTER — Other Ambulatory Visit: Payer: Self-pay | Admitting: Surgery

## 2011-02-14 ENCOUNTER — Inpatient Hospital Stay (HOSPITAL_COMMUNITY)
Admission: RE | Admit: 2011-02-14 | Discharge: 2011-02-18 | DRG: 164 | Disposition: A | Discharge status: Home / Self Care | Payer: Medicare Other | Source: Ambulatory Visit | Attending: Surgery | Admitting: Surgery

## 2011-02-14 ENCOUNTER — Inpatient Hospital Stay (HOSPITAL_COMMUNITY): Payer: Medicare Other

## 2011-02-14 DIAGNOSIS — K219 Gastro-esophageal reflux disease without esophagitis: Secondary | ICD-10-CM | POA: Diagnosis present

## 2011-02-14 DIAGNOSIS — F3289 Other specified depressive episodes: Secondary | ICD-10-CM | POA: Diagnosis present

## 2011-02-14 DIAGNOSIS — M1990 Unspecified osteoarthritis, unspecified site: Secondary | ICD-10-CM | POA: Diagnosis present

## 2011-02-14 DIAGNOSIS — J4489 Other specified chronic obstructive pulmonary disease: Secondary | ICD-10-CM | POA: Diagnosis present

## 2011-02-14 DIAGNOSIS — Z7982 Long term (current) use of aspirin: Secondary | ICD-10-CM

## 2011-02-14 DIAGNOSIS — J9819 Other pulmonary collapse: Secondary | ICD-10-CM | POA: Diagnosis not present

## 2011-02-14 DIAGNOSIS — Z01810 Encounter for preprocedural cardiovascular examination: Secondary | ICD-10-CM

## 2011-02-14 DIAGNOSIS — Z9981 Dependence on supplemental oxygen: Secondary | ICD-10-CM

## 2011-02-14 DIAGNOSIS — Z951 Presence of aortocoronary bypass graft: Secondary | ICD-10-CM

## 2011-02-14 DIAGNOSIS — E119 Type 2 diabetes mellitus without complications: Secondary | ICD-10-CM | POA: Diagnosis present

## 2011-02-14 DIAGNOSIS — I4891 Unspecified atrial fibrillation: Secondary | ICD-10-CM | POA: Diagnosis not present

## 2011-02-14 DIAGNOSIS — Z87891 Personal history of nicotine dependence: Secondary | ICD-10-CM

## 2011-02-14 DIAGNOSIS — F411 Generalized anxiety disorder: Secondary | ICD-10-CM | POA: Diagnosis present

## 2011-02-14 DIAGNOSIS — G4733 Obstructive sleep apnea (adult) (pediatric): Secondary | ICD-10-CM | POA: Diagnosis present

## 2011-02-14 DIAGNOSIS — I10 Essential (primary) hypertension: Secondary | ICD-10-CM | POA: Diagnosis present

## 2011-02-14 DIAGNOSIS — Z01812 Encounter for preprocedural laboratory examination: Secondary | ICD-10-CM

## 2011-02-14 DIAGNOSIS — I2510 Atherosclerotic heart disease of native coronary artery without angina pectoris: Secondary | ICD-10-CM | POA: Diagnosis present

## 2011-02-14 DIAGNOSIS — J9383 Other pneumothorax: Secondary | ICD-10-CM | POA: Diagnosis not present

## 2011-02-14 DIAGNOSIS — E7800 Pure hypercholesterolemia, unspecified: Secondary | ICD-10-CM | POA: Diagnosis present

## 2011-02-14 DIAGNOSIS — C3410 Malignant neoplasm of upper lobe, unspecified bronchus or lung: Principal | ICD-10-CM | POA: Diagnosis present

## 2011-02-14 DIAGNOSIS — J449 Chronic obstructive pulmonary disease, unspecified: Secondary | ICD-10-CM | POA: Diagnosis present

## 2011-02-14 DIAGNOSIS — F329 Major depressive disorder, single episode, unspecified: Secondary | ICD-10-CM | POA: Diagnosis present

## 2011-02-14 HISTORY — PX: OTHER SURGICAL HISTORY: SHX169

## 2011-02-14 LAB — BLOOD GAS, ARTERIAL
Bicarbonate: 22.1 mEq/L (ref 20.0–24.0)
FIO2: 0.21 %
Patient temperature: 98.6
pH, Arterial: 7.43 — ABNORMAL HIGH (ref 7.350–7.400)

## 2011-02-14 LAB — GLUCOSE, CAPILLARY
Glucose-Capillary: 128 mg/dL — ABNORMAL HIGH (ref 70–99)
Glucose-Capillary: 148 mg/dL — ABNORMAL HIGH (ref 70–99)

## 2011-02-14 NOTE — Telephone Encounter (Signed)
CDY is aware.Veronica Duarte

## 2011-02-14 NOTE — Telephone Encounter (Signed)
Will forward to Dr. Young as FYI.  

## 2011-02-14 NOTE — Telephone Encounter (Signed)
Libby or Bjorn Loser, this is an old message that never got signed off.  Please advise if this message was addressed or not.  If addressed, please close the encounter.  Thank you.

## 2011-02-14 NOTE — Assessment & Plan Note (Signed)
Summary: follow up visit after needle biopsy/kcw   Primary Provider/Referring Provider:  jones  CC:  Follow up visit-review Needle biopsy results..  History of Present Illness: January 02, 2011- Pulmonary New Patient-SOB. 65 Yo F coming today with her daughter- a very helpful nurse at Dr Hazle Coca office, on kind referral from Dr Allyson Sabal for pulmonary evaluation of COPD and a lung nodule.  Former long-time smoker, dx'd with COPD in 1997 on hospitalization for acute exacerbation,  Aware of shortness of breath x severa years. but no actual evaluation. Worse in cold weather. DOE 100 feet or few steps or stairs. Little cough- dry, without wheeze, edema or chest pain. Recently hospitalized to deal with hand infected after cat bite. Anesthesiologist told her breathing was bad and she needed to see a pulmonary doctor. CXR 10/08/10- emphysema; RUL nodule>>recommend CT.  Using Symbicort 160, Proair. Spiriva helped but can't afford it.  Past hx pneumonia. Has had pneumovax more than once, but missed flu shot this year.  Seasonal allergic rhinitis only in childhood.  Hx obstructive sleep apnea, on BiPAP 15/10/ SMS, used all night, every night. Full face mask- Dr Tresa Endo. Coronary disease, no MI. Had CABG 2 v after interventional perf RCA. EF > 50%.  February 04, 2011- COPD, Lung nodule, hx OSA, hx CAD/ CABG daughter here Nurse-CC: Follow up visit-review Needle biopsy results OSA- Uses BIPAP 15/10 alll night every night- SMS PET 01/21/11- RUL cancer cw bronchogenic ca T1aN0M0., SUV 7.7, Pleural based 1.2 cm single nodule. Needle bx 01/30/11- adenocarcinoma PFT- 01/10/11- Severe obstruction w slight resp to BD. FEV1 100/ 47%; FVC 2.56/ 88%; R 0.39; RV 163%= airtrapping; DLCO 43%. Says she is "fine", denying cough or chest pain.  Old cat bite injury and surgery on dorsal left hand are healing finally.  They are going forward with neurology w/u for ? dementia, and orthopedic eval of right hip pain, but lung nodule is  understood to be the priority.     Preventive Screening-Counseling & Management  Alcohol-Tobacco     Alcohol drinks/day: 0     Alcohol Counseling: not indicated; patient does not drink     Smoking Status: quit > 6 months     Packs/Day: 2.0     Year Started: 1962     Year Quit: 2004     Tobacco Counseling: to remain off tobacco products  Current Medications (verified): 1)  Multivitamins  Tabs (Multiple Vitamin) .... Take 1 By Mouth Once Daily 2)  Potassium 99 Mg Tabs (Potassium) .... Take 1 By Mouth Once Daily 3)  Diovan 160 Mg Tabs (Valsartan) .... Take 1 By Mouth Once Daily 4)  Bystolic 10 Mg Tabs (Nebivolol Hcl) .... Take 1 By Mouth Once Daily 5)  Cymbalta 60 Mg Cpep (Duloxetine Hcl) .... Take 1 By Mouth Once Daily 6)  Metformin Hcl 500 Mg Tabs (Metformin Hcl) .... Take 1 By Mouth Two Times A Day 7)  Zetia 10 Mg Tabs (Ezetimibe) .... Take 1 By Mouth Once Daily 8)  Crestor 20 Mg Tabs (Rosuvastatin Calcium) .... Take 1 By Mouth Once Daily 9)  Niaspan 1000 Mg Cr-Tabs (Niacin (Antihyperlipidemic)) .... Take 1 By Mouth Once Daily 10)  Aspirin 81 Mg Tbec (Aspirin) .... Take 1 By Mouth Once Daily 11)  Vitamin C 500 Mg Tabs (Ascorbic Acid) .... Take 1 By Mouth Two Times A Day 12)  Proair Hfa 108 (90 Base) Mcg/act Aers (Albuterol Sulfate) .... 2 Puffs Four Times A Day As Needed 13)  Symbicort 160-4.5 Mcg/act Aero (  Budesonide-Formoterol Fumarate) .... 2 Puffs Two Times A Day and Rinse Mouth After Use 14)  Bipap 15/10 Sms 15)  Spiriva Handihaler 18 Mcg Caps (Tiotropium Bromide Monohydrate) .... Inhale 1 Capsule Via Handihaler Once Daily  Allergies (verified): No Known Drug Allergies  Past History:  Family History: Last updated: 01/02/2011 Family History of Arthritis Family History Depression Family History High cholesterol Family History Hypertension  Social History: Last updated: 01/02/2011 Retired Divorced Alcohol use-no Drug use-no Regular exercise-no  Risk  Factors: Alcohol Use: 0 (02/04/2011) Exercise: no (01/02/2011)  Risk Factors: Smoking Status: quit > 6 months (02/04/2011) Packs/Day: 2.0 (02/04/2011)  Past Medical History: Colonic polyps, hx of COPD- PFT 01/10/11- severe. FEV1/FVC 0.39, DLCO 43% Adenocarcinoma RUL lung-, Needle bx 01/2011; PET 2/ 2012 ? Dementia Depression Diabetes mellitus, type II Hyperlipidemia CAD/ hx MI/ CABG Hypertension Osteoarthritis AAA Obstructive sleep Apnea RAS  Past Surgical History: Coronary artery bypass graft Multiple I and D's left hand s/p infected cat bite left hand November 2011 Hysterectomy Needle bx RUL nodule> adenocarcinoma 01/30/11  Review of Systems      See HPI       The patient complains of shortness of breath with activity.  The patient denies shortness of breath at rest, productive cough, non-productive cough, coughing up blood, chest pain, irregular heartbeats, acid heartburn, indigestion, loss of appetite, weight change, abdominal pain, difficulty swallowing, sore throat, tooth/dental problems, headaches, nasal congestion/difficulty breathing through nose, and sneezing.    Vital Signs:  Patient profile:   65 year old female Menstrual status:  hysterectomy Height:      62 inches Weight:      200.25 pounds BMI:     36.76 O2 Sat:      97 % on Room air Pulse rate:   73 / minute BP sitting:   124 / 64  (left arm) Cuff size:   large  Vitals Entered By: Reynaldo Minium CMA (February 04, 2011 3:09 PM)  O2 Flow:  Room air CC: Follow up visit-review Needle biopsy results.   Physical Exam  Additional Exam:  General: A/Ox3; pleasant and cooperative, NAD, overweight   RA O2 sat 97% at rest SKIN: healing laceration dorsum left hand NODES: no lymphadenopathy HEENT: [place]/AT, EOM- WNL, Conjuctivae- clear, PERRLA, TM-WNL, Nose- clear, Throat- clear and wnl, own teeth, tonsils, Mallampati  II NECK: Supple w/ fair ROM, JVD- none, normal carotid impulses w/o bruits Thyroid- normal to  palpation CHEST: decreased breath sounds, raspy cough, laughing easily HEART: RRR, no m/g/r heard ABDOMEN: overweight EAV:WUJW, nl pulses, no edema  NEURO: Grossly intact to observation- reportedly decreased memory      Impression & Recommendations:  Problem # 1:  ADENOCARCINOMA, RIGHT LUNG, UPPER LOBE (ICD-162.3) T1aNoMo by needle bx signed out 01/31/11, Dr Dierdre Searles PET shows no other, except incidental mention of assymetry in vocal cord area favored to be benign by radiologist She has significant but clinically controlled COPD, and hx CAD. I think she is a candidate for limited resection. we will add her to tumor conference and refer her to surgery. I will make a tentative f/u appointment as a space holder till we see where this is going.   Problem # 2:  COPD (ICD-496) Severe COPD butr admitting very little symptomatically beyond dyspnea on stairs in her townhouse.  She will contijnue Symbicort with rare need for rescue inhaler.  Problem # 3:  SLEEP APNEA, OBSTRUCTIVE (ICD-327.23) She continues to report good compliance and control with CPAP, and that she sleeps better with it.  Medications Added to Medication List This Visit: 1)  Cymbalta 60 Mg Cpep (Duloxetine hcl) .... Take 1 by mouth once daily 2)  Spiriva Handihaler 18 Mcg Caps (Tiotropium bromide monohydrate) .... Inhale 1 capsule via handihaler once daily  Other Orders: Est. Patient Level IV ([address]) Surgical Referral (Surgery)  Patient Instructions: 1)  Please schedule a follow-up appointment in 1 month. 2)  See [facility] to set up Thoracic surgery consultation- Dr Laneta Simmers

## 2011-02-15 ENCOUNTER — Inpatient Hospital Stay (HOSPITAL_COMMUNITY): Payer: Medicare Other

## 2011-02-15 LAB — BLOOD GAS, ARTERIAL
Acid-Base Excess: 1.2 mmol/L (ref 0.0–2.0)
Drawn by: 31288
Patient temperature: 98.6
pCO2 arterial: 43.7 mmHg (ref 35.0–45.0)
pH, Arterial: 7.387 (ref 7.350–7.400)

## 2011-02-15 LAB — CBC
MCHC: 29.9 g/dL — ABNORMAL LOW (ref 30.0–36.0)
MCV: 79 fL (ref 78.0–100.0)
Platelets: 180 10*3/uL (ref 150–400)
RDW: 16.8 % — ABNORMAL HIGH (ref 11.5–15.5)
WBC: 7.3 10*3/uL (ref 4.0–10.5)

## 2011-02-15 LAB — BASIC METABOLIC PANEL
Calcium: 8.7 mg/dL (ref 8.4–10.5)
Chloride: 106 mEq/L (ref 96–112)
Creatinine, Ser: 0.63 mg/dL (ref 0.4–1.2)
GFR calc Af Amer: >60 mL/min (ref >60)
Sodium: 139 mEq/L (ref 135–145)

## 2011-02-15 LAB — GLUCOSE, CAPILLARY
Glucose-Capillary: 97 mg/dL (ref 70–99)
Glucose-Capillary: 126 mg/dL — ABNORMAL HIGH (ref 70–99)
Glucose-Capillary: 135 mg/dL — ABNORMAL HIGH (ref 70–99)
Glucose-Capillary: 124 mg/dL — ABNORMAL HIGH (ref 70–99)

## 2011-02-15 NOTE — Op Note (Signed)
NAME:  Veronica Duarte, Veronica Duarte                ACCOUNT NO.:  [id number]  MEDICAL RECORD NO.:  [id number]           PATIENT TYPE:  I  LOCATION:  3315                         FACILITY:  [facility]  PHYSICIAN:  Evelene Croon, M.D.     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  PROCEDURE:  Right video-assisted thoracoscopy with wedge resection of right upper lobe lung cancer.  SURGEON:  Evelene Croon, MD  ASSISTANT:  Doree Fudge, PA  ANESTHESIA:  General endotracheal.  CLINICAL HISTORY:  This patient is a 65 year old previous smoker with severe COPD referred by Dr. Maple Hudson for consideration of surgical resection of a small peripheral right upper lobe non-small cell lung cancer.  I knew her from previous emergent coronary bypass surgery in October 2010.  The patient sustained a small puncture wound on the back of her left hand from a cat bite in November 2011, and subsequently developed severe infection of the hand and underwent 4-5 drainage and debridement procedures and did not have complete closure of that until February.  She had a chest x-ray performed as part of her preoperative evaluation due to her severe COPD and this showed a small peripheral right upper lobe lung nodule.  CT scan was obtained.  It showed a 1.2 cm peripheral right upper lobe lung nodule with no other abnormalities seen.  PET scan was done on January 21, 2011, which showed hypermetabolic uptake within this nodule with an SUV of 7.7.  There was no other abnormal activity within the mediastinum or hilum and no abdominal or pelvic hypermetabolism.  A CT scan of the head in September 2011, was negative for a metastatic disease.  This was done as part of a dimensional workup.  A CT-guided needle biopsy  of the right upper lobe lung nodule was done recently which showed non-small cell lung cancer most likely adenocarcinoma.  After review of the patient and her pulmonary function testing showing severe COPD, it was felt that a right VATS with wedge resection of the lung cancer would be the best treatment.  I discussed the operative procedure with the patient and her daughter who is a Engineer, civil (consulting).  We discussed alternatives, benefits, and risks including but not limited to bleeding, blood transfusion, infection, air leak from the lungs, positive resection margin requiring further therapy, and she understood all this and agreed to proceed.  OPERATIVE PROCEDURE:  The patient was seen in the preoperative holding area.  Proper patient, proper operative side, and proper operation were confirmed with the patient after reviewing the chart.  The right side of the chest was signed by me.  The patient received preoperative intravenous antibiotics.  She was taken back to the operating room and placed on the table in supine position.  After induction of general endotracheal anesthesia using a double-lumen tube, the patient was positioned in the left lateral decubitus position with the right side up.  A Foley catheter  was placed in the bladder using sterile technique. Lower extremity pneumatic compression devices were used as well as a Patent attorney for warming.  Then, the right side of the oppressive chest was prepped with Betadine soap and solution and draped in usual sterile manner.  Time-out was taken, proper patient, proper operative side, proper operation were confirmed with Nursing and Anesthesia staff. The CAT scan was reviewed on monitor and I confirmed that was the right side of the chest and lesion was in the right upper lobe of the lung.  Then, a 1-cm incision was made in the midaxillary line about 1 cm below the level of the tip of the scapula.  This was carried down through  the subcutaneous tissue using electrocautery and then bluntly into the pleural space.  The right lung had already been deflated.  The 30-degree thoracoscope was inserted and examination of the  lung clearly showed the umbilicated lung cancer on the posterolateral surface of the right upper lobe.  There were no adhesions present.  The remainder of the lung and pleural space were examined with the thoracoscope and no other abnormalities were seen.  Then, a 1-cm incision was made in the anterior axillary line about two interspaces higher than this incision.  A third incision was made in the posterior axillary line at the same level and a fourth incision made inferiorly in the posterior axillary line.  Through the 4 thoracoscopy incisions, the right upper lobe was retracted with a clamp and a 60-mm Autosuture purple stapler was used to perform a wedge resection around the right upper lobe lung cancer.  It was straightforward to obtain a good visible margin.  This took three firings of the stapler.  There was still a small area where the resection specimen was a patch of the remainder of the right upper lobe and this was divided using a 45-mm purple stapler.  Then, an endobag was inserted through one of the incisions and the specimen placed inside the bag and removed through the posterior incision which had been slightly dilated to allow removal of the specimen.  The specimen was examined on the table and the lesion was clearly seen with at least a 1 cm margin around it.  It was sent to pathology for frozen section which showed a non-small cell lung cancer consistent with adenocarcinoma with a greater than 1 cm free margin.  The staple line was examined and appeared intact.  There was one area where there was a slight disruption in the visceral pleura adjacent to the staple line and I believe that was at the depth of the wedge resection.  This area was coated with CoSeal to try to seal any  potential air leak.  Then, a 28-French chest tube was brought through the midaxillary incision and positioned with the tip near the apex and affixed to the skin with silk suture.  The lung was then reinflated under direct vision.  There was no gross air leak. Then, the three remaining incisions were closed with 2-0 Vicryl subcutaneous horizontal mattress suture and a 3-0 Vicryl subcuticular skin closure.  Sponge, needle and instrument counts were correct according to scrub nurse.  Dry sterile dressing was applied over these incisions and around the chest tube which was hooked to Pleur-Evac suction.  The patient was then turned in the supine position, extubated, and transported to the Post Anesthesia Care Unit in satisfactory and stable condition.     Evelene Croon, M.D.     BB/MEDQ  D:  02/14/2011  T:  02/15/2011  Job:  [id number]  cc:   Joni Fears D. Maple Hudson, MD, [facility], FACP  Electronically Signed by Evelene Croon M.D. on 02/15/2011 03:39:16 PM

## 2011-02-15 NOTE — Telephone Encounter (Signed)
Pt was notified of her appt by anna@ [facility]

## 2011-02-16 ENCOUNTER — Inpatient Hospital Stay (HOSPITAL_COMMUNITY): Payer: Medicare Other

## 2011-02-16 LAB — GLUCOSE, CAPILLARY
Glucose-Capillary: 113 mg/dL — ABNORMAL HIGH (ref 70–99)
Glucose-Capillary: 94 mg/dL (ref 70–99)

## 2011-02-17 ENCOUNTER — Inpatient Hospital Stay (HOSPITAL_COMMUNITY): Payer: Medicare Other

## 2011-02-17 LAB — GLUCOSE, CAPILLARY
Glucose-Capillary: 86 mg/dL (ref 70–99)
Glucose-Capillary: 91 mg/dL (ref 70–99)

## 2011-02-18 ENCOUNTER — Inpatient Hospital Stay (HOSPITAL_COMMUNITY): Payer: Medicare Other

## 2011-02-18 LAB — GLUCOSE, CAPILLARY
Glucose-Capillary: 108 mg/dL — ABNORMAL HIGH (ref 70–99)
Glucose-Capillary: 85 mg/dL (ref 70–99)
Glucose-Capillary: 99 mg/dL (ref 70–99)

## 2011-02-19 LAB — GLUCOSE, CAPILLARY: Glucose-Capillary: 95 mg/dL (ref 70–99)

## 2011-02-21 LAB — GLUCOSE, CAPILLARY
Glucose-Capillary: 103 mg/dL — ABNORMAL HIGH (ref 70–99)
Glucose-Capillary: 103 mg/dL — ABNORMAL HIGH (ref 70–99)
Glucose-Capillary: 108 mg/dL — ABNORMAL HIGH (ref 70–99)
Glucose-Capillary: 82 mg/dL (ref 70–99)
Glucose-Capillary: 83 mg/dL (ref 70–99)
Glucose-Capillary: 85 mg/dL (ref 70–99)
Glucose-Capillary: 92 mg/dL (ref 70–99)
Glucose-Capillary: 96 mg/dL (ref 70–99)
Glucose-Capillary: 99 mg/dL (ref 70–99)
Glucose-Capillary: 108 mg/dL — ABNORMAL HIGH (ref 70–99)
Glucose-Capillary: 110 mg/dL — ABNORMAL HIGH (ref 70–99)
Glucose-Capillary: 113 mg/dL — ABNORMAL HIGH (ref 70–99)
Glucose-Capillary: 125 mg/dL — ABNORMAL HIGH (ref 70–99)
Glucose-Capillary: 132 mg/dL — ABNORMAL HIGH (ref 70–99)
Glucose-Capillary: 133 mg/dL — ABNORMAL HIGH (ref 70–99)

## 2011-02-21 LAB — POCT I-STAT 3, ART BLOOD GAS (G3+)
Acid-base deficit: 2 mmol/L (ref 0.0–2.0)
Bicarbonate: 22.4 mEq/L (ref 20.0–24.0)
Bicarbonate: 23.5 mEq/L (ref 20.0–24.0)
O2 Saturation: 100 %
O2 Saturation: 94 %
O2 Saturation: 97 %
O2 Saturation: 98 %
Patient temperature: 34.9
TCO2: 22 mmol/L (ref 0–100)
TCO2: 23 mmol/L (ref 0–100)
TCO2: 25 mmol/L (ref 0–100)
pCO2 arterial: 33.9 mmHg — ABNORMAL LOW (ref 35.0–45.0)
pCO2 arterial: 38.4 mmHg (ref 35.0–45.0)
pCO2 arterial: 41.9 mmHg (ref 35.0–45.0)
pH, Arterial: 7.356 (ref 7.350–7.400)
pH, Arterial: 7.375 (ref 7.350–7.400)
pO2, Arterial: 94 mmHg (ref 80.0–100.0)

## 2011-02-21 LAB — POCT I-STAT, CHEM 8
BUN: 8 mg/dL (ref 6–23)
Creatinine, Ser: 0.7 mg/dL (ref 0.4–1.2)
Potassium: 4 mEq/L (ref 3.5–5.1)
Sodium: 140 mEq/L (ref 135–145)
TCO2: 21 mmol/L (ref 0–100)

## 2011-02-21 LAB — CBC
HCT: 27 % — ABNORMAL LOW (ref 36.0–46.0)
HCT: 27.2 % — ABNORMAL LOW (ref 36.0–46.0)
HCT: 28.4 % — ABNORMAL LOW (ref 36.0–46.0)
HCT: 28.5 % — ABNORMAL LOW (ref 36.0–46.0)
Hemoglobin: 8.1 g/dL — ABNORMAL LOW (ref 12.0–15.0)
Hemoglobin: 9.1 g/dL — ABNORMAL LOW (ref 12.0–15.0)
Hemoglobin: 9.2 g/dL — ABNORMAL LOW (ref 12.0–15.0)
Hemoglobin: 9.6 g/dL — ABNORMAL LOW (ref 12.0–15.0)
Hemoglobin: 9.8 g/dL — ABNORMAL LOW (ref 12.0–15.0)
MCHC: 33.7 g/dL (ref 30.0–36.0)
MCHC: 34 g/dL (ref 30.0–36.0)
MCHC: 33.5 g/dL (ref 30.0–36.0)
MCHC: 34 g/dL (ref 30.0–36.0)
MCHC: 34 g/dL (ref 30.0–36.0)
MCV: 85 fL (ref 78.0–100.0)
MCV: 82.9 fL (ref 78.0–100.0)
MCV: 83.6 fL (ref 78.0–100.0)
MCV: 83.6 fL (ref 78.0–100.0)
Platelets: 176 10*3/uL (ref 150–400)
Platelets: 220 10*3/uL (ref 150–400)
RBC: 2.65 MIL/uL — ABNORMAL LOW (ref 3.87–5.11)
RBC: 2.68 MIL/uL — ABNORMAL LOW (ref 3.87–5.11)
RBC: 3.23 MIL/uL — ABNORMAL LOW (ref 3.87–5.11)
RBC: 3.26 MIL/uL — ABNORMAL LOW (ref 3.87–5.11)
RBC: 3.29 MIL/uL — ABNORMAL LOW (ref 3.87–5.11)
RBC: 3.42 MIL/uL — ABNORMAL LOW (ref 3.87–5.11)
RDW: 14.6 % (ref 11.5–15.5)
RDW: 15 % (ref 11.5–15.5)
RDW: 14.1 % (ref 11.5–15.5)
RDW: 14.3 % (ref 11.5–15.5)
WBC: 5.5 10*3/uL (ref 4.0–10.5)
WBC: 10.2 10*3/uL (ref 4.0–10.5)
WBC: 11.9 10*3/uL — ABNORMAL HIGH (ref 4.0–10.5)
WBC: 8.8 10*3/uL (ref 4.0–10.5)
WBC: 8.9 10*3/uL (ref 4.0–10.5)

## 2011-02-21 LAB — BASIC METABOLIC PANEL
BUN: 11 mg/dL (ref 6–23)
BUN: 13 mg/dL (ref 6–23)
BUN: 6 mg/dL (ref 6–23)
CO2: 25 mEq/L (ref 19–32)
CO2: 27 mEq/L (ref 19–32)
CO2: 27 mEq/L (ref 19–32)
CO2: 23 mEq/L (ref 19–32)
Calcium: 8.6 mg/dL (ref 8.4–10.5)
Calcium: 8.8 mg/dL (ref 8.4–10.5)
Calcium: 8.8 mg/dL (ref 8.4–10.5)
Calcium: 8 mg/dL — ABNORMAL LOW (ref 8.4–10.5)
Chloride: 105 mEq/L (ref 96–112)
Chloride: 107 mEq/L (ref 96–112)
Chloride: 109 mEq/L (ref 96–112)
Creatinine, Ser: 0.73 mg/dL (ref 0.4–1.2)
Creatinine, Ser: 0.73 mg/dL (ref 0.4–1.2)
Creatinine, Ser: 0.78 mg/dL (ref 0.4–1.2)
Creatinine, Ser: 0.77 mg/dL (ref 0.4–1.2)
GFR calc Af Amer: >60 mL/min (ref >60)
GFR calc Af Amer: >60 mL/min (ref >60)
GFR calc Af Amer: >60 mL/min (ref >60)
GFR calc non Af Amer: >60 mL/min (ref >60)
GFR calc non Af Amer: >60 mL/min (ref >60)
GFR calc non Af Amer: >60 mL/min (ref >60)
GFR calc non Af Amer: >60 mL/min (ref >60)
Glucose, Bld: 114 mg/dL — ABNORMAL HIGH (ref 70–99)
Glucose, Bld: 93 mg/dL (ref 70–99)
Glucose, Bld: 131 mg/dL — ABNORMAL HIGH (ref 70–99)
Potassium: 3.9 mEq/L (ref 3.5–5.1)
Potassium: 3.6 mEq/L (ref 3.5–5.1)
Potassium: 4 mEq/L (ref 3.5–5.1)
Sodium: 141 mEq/L (ref 135–145)
Sodium: 133 mEq/L — ABNORMAL LOW (ref 135–145)
Sodium: 140 mEq/L (ref 135–145)

## 2011-02-21 LAB — POCT I-STAT 4, (NA,K, GLUC, HGB,HCT)
Glucose, Bld: 127 mg/dL — ABNORMAL HIGH (ref 70–99)
Glucose, Bld: 128 mg/dL — ABNORMAL HIGH (ref 70–99)
Glucose, Bld: 141 mg/dL — ABNORMAL HIGH (ref 70–99)
Glucose, Bld: 147 mg/dL — ABNORMAL HIGH (ref 70–99)
Glucose, Bld: 171 mg/dL — ABNORMAL HIGH (ref 70–99)
HCT: 24 % — ABNORMAL LOW (ref 36.0–46.0)
HCT: 26 % — ABNORMAL LOW (ref 36.0–46.0)
HCT: 28 % — ABNORMAL LOW (ref 36.0–46.0)
HCT: 30 % — ABNORMAL LOW (ref 36.0–46.0)
Hemoglobin: 8.2 g/dL — ABNORMAL LOW (ref 12.0–15.0)
Hemoglobin: 8.8 g/dL — ABNORMAL LOW (ref 12.0–15.0)
Hemoglobin: 9.5 g/dL — ABNORMAL LOW (ref 12.0–15.0)
Potassium: 3.6 mEq/L (ref 3.5–5.1)
Potassium: 3.6 mEq/L (ref 3.5–5.1)
Potassium: 3.9 mEq/L (ref 3.5–5.1)
Potassium: 4.2 mEq/L (ref 3.5–5.1)
Potassium: 5.1 mEq/L (ref 3.5–5.1)
Sodium: 131 mEq/L — ABNORMAL LOW (ref 135–145)

## 2011-02-21 LAB — DIFFERENTIAL
Basophils Absolute: 0 10*3/uL (ref 0.0–0.1)
Basophils Relative: 0 % (ref 0–1)
Eosinophils Absolute: 0.1 10*3/uL (ref 0.0–0.7)
Eosinophils Relative: 1 % (ref 0–5)
Lymphocytes Relative: 17 % (ref 12–46)
Lymphs Abs: 1.5 10*3/uL (ref 0.7–4.0)
Monocytes Absolute: 0.7 10*3/uL (ref 0.1–1.0)
Monocytes Relative: 8 % (ref 3–12)
Neutro Abs: 6.5 10*3/uL (ref 1.7–7.7)
Neutrophils Relative %: 74 % (ref 43–77)

## 2011-02-21 LAB — ABO/RH: ABO/RH(D): B POS

## 2011-02-21 LAB — PROTIME-INR
INR: 1.59 — ABNORMAL HIGH (ref 0.00–1.49)
Prothrombin Time: 18.8 seconds — ABNORMAL HIGH (ref 11.6–15.2)

## 2011-02-21 LAB — CROSSMATCH: Antibody Screen: NEGATIVE

## 2011-02-21 LAB — POCT I-STAT GLUCOSE
Glucose, Bld: 134 mg/dL — ABNORMAL HIGH (ref 70–99)
Operator id: 3342

## 2011-02-21 LAB — POCT I-STAT 3, VENOUS BLOOD GAS (G3P V)
O2 Saturation: 73 %
pCO2, Ven: 67.4 mmHg — ABNORMAL HIGH (ref 45.0–50.0)
pH, Ven: 7.465 — ABNORMAL HIGH (ref 7.250–7.300)
pO2, Ven: 39 mmHg (ref 30.0–45.0)

## 2011-02-21 LAB — APTT: aPTT: 35 seconds (ref 24–37)

## 2011-02-21 LAB — MAGNESIUM
Magnesium: 2.5 mg/dL (ref 1.5–2.5)
Magnesium: 2.6 mg/dL — ABNORMAL HIGH (ref 1.5–2.5)

## 2011-02-21 LAB — PREPARE PLATELETS

## 2011-02-21 LAB — HEMOCCULT GUIAC POC 1CARD (OFFICE): Fecal Occult Bld: POSITIVE

## 2011-02-21 LAB — HEMOGLOBIN AND HEMATOCRIT, BLOOD
HCT: 26.6 % — ABNORMAL LOW (ref 36.0–46.0)
Hemoglobin: 8.9 g/dL — ABNORMAL LOW (ref 12.0–15.0)

## 2011-02-21 LAB — CREATININE, SERUM: GFR calc Af Amer: >60 mL/min (ref >60)

## 2011-02-21 LAB — PLATELET COUNT: Platelets: 191 10*3/uL (ref 150–400)

## 2011-02-26 ENCOUNTER — Encounter (INDEPENDENT_AMBULATORY_CARE_PROVIDER_SITE_OTHER): Payer: Self-pay

## 2011-02-26 DIAGNOSIS — C3490 Malignant neoplasm of unspecified part of unspecified bronchus or lung: Secondary | ICD-10-CM

## 2011-03-11 ENCOUNTER — Other Ambulatory Visit: Payer: Self-pay | Admitting: Surgery

## 2011-03-11 DIAGNOSIS — D381 Neoplasm of uncertain behavior of trachea, bronchus and lung: Secondary | ICD-10-CM

## 2011-03-12 ENCOUNTER — Encounter: Payer: Medicare Other | Admitting: Surgery

## 2011-03-12 ENCOUNTER — Encounter (INDEPENDENT_AMBULATORY_CARE_PROVIDER_SITE_OTHER): Payer: Self-pay | Admitting: Surgery

## 2011-03-12 ENCOUNTER — Ambulatory Visit
Admission: RE | Admit: 2011-03-12 | Discharge: 2011-03-12 | Disposition: A | Discharge status: Home / Self Care | Payer: Medicare Other | Source: Ambulatory Visit | Attending: Surgery | Admitting: Surgery

## 2011-03-12 DIAGNOSIS — D381 Neoplasm of uncertain behavior of trachea, bronchus and lung: Secondary | ICD-10-CM

## 2011-03-12 DIAGNOSIS — C3490 Malignant neoplasm of unspecified part of unspecified bronchus or lung: Secondary | ICD-10-CM

## 2011-03-13 NOTE — Assessment & Plan Note (Signed)
OFFICE VISIT  MIAH, BOYE DOB:  1946-03-10                                        March 12, 2011 CHART #:  [id number]  Ms. Riveron returned to my office today for followup, status post right VATS with wedge resection of a small right upper lobe adenocarcinoma on February 14, 2011.  She has severe COPD with an FEV-1 of 0.94 and was not felt to be a candidate for lobectomy.  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  The parenchymal resection margin was negative for tumor.  Her preoperative PET scan had shown no other abnormal hypermetabolic activity within the hilum or mediastinum.  Since discharge she said, she has continued to have a fair amount of pain along the distribution of the intercostal nerve from the front to the back.  She has been using oxycodone IR as needed.  Her daughter is a Engineer, civil (consulting) in Dr. Clayborne Dana office and said that she has been complaining of pain just about every day.  She was initially sent home on oxygen, but was gradually weaned off that by her daughter using a pulse oximeter.  On physical examination today; her blood pressure is 151/92, her pulse is 68 and regular, respiratory rate is 20 and unlabored.  Oxygen saturation on room air is 96%.  She looks well.  Cardiac exam shows regular rate and rhythm with normal heart sounds.  Her lung exam is clear.  The right chest incisions are well-healed.  Followup chest x-ray today shows clear lung fields and no pleural effusions.  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  She will be referred to Dr. Arbutus Ped for Oncology consultation to decide whether any adjuvant therapy would be of benefit and to arrange  long-term followup. I wrote her a prescription today for oxycodone IR 5-10 mg q.6 h. p.r.n. for pain #50.  I would expect her intercostal nerve pain to gradually resolve over the next few months.  I encouraged her to continue to get out and walk as much as possible because her daughter said that she has mostly been staying at home and sitting on the couch.  I told her she can get back to driving, which she feels comfortable with that and can liberalize her activity as she feels able.  We will discontinue her oxygen.  I will plan to see her back in about 3 months with a chest x- ray for followup.  Evelene Croon, M.D. Electronically Signed  BB/MEDQ  D:  03/12/2011  T:  03/13/2011  Job:  [id number]  cc:   Joni Fears D. Maple Hudson, MD, FCCP, FACP Lajuana Matte, M.D.

## 2011-03-14 ENCOUNTER — Encounter (HOSPITAL_BASED_OUTPATIENT_CLINIC_OR_DEPARTMENT_OTHER): Payer: Medicare Other | Admitting: Internal Medicine

## 2011-03-14 ENCOUNTER — Other Ambulatory Visit: Payer: Self-pay | Admitting: Internal Medicine

## 2011-03-14 DIAGNOSIS — C3410 Malignant neoplasm of upper lobe, unspecified bronchus or lung: Secondary | ICD-10-CM

## 2011-03-14 DIAGNOSIS — E7800 Pure hypercholesterolemia, unspecified: Secondary | ICD-10-CM

## 2011-03-14 DIAGNOSIS — I10 Essential (primary) hypertension: Secondary | ICD-10-CM

## 2011-03-14 DIAGNOSIS — E119 Type 2 diabetes mellitus without complications: Secondary | ICD-10-CM

## 2011-03-14 DIAGNOSIS — C3490 Malignant neoplasm of unspecified part of unspecified bronchus or lung: Secondary | ICD-10-CM

## 2011-03-14 LAB — COMPREHENSIVE METABOLIC PANEL
ALT: 18 U/L (ref 0–35)
CO2: 25 mEq/L (ref 19–32)
Creatinine, Ser: 0.76 mg/dL (ref 0.40–1.20)
Glucose, Bld: 82 mg/dL (ref 70–99)
Total Bilirubin: 0.7 mg/dL (ref 0.3–1.2)

## 2011-03-14 LAB — CBC WITH DIFFERENTIAL/PLATELET
BASO%: 0.6 % (ref 0.0–2.0)
HCT: 39.6 % (ref 34.8–46.6)
LYMPH%: 42.6 % (ref 14.0–49.7)
MCH: 25.1 pg (ref 25.1–34.0)
MCHC: 32.3 g/dL (ref 31.5–36.0)
MONO#: 0.5 10*3/uL (ref 0.1–0.9)
NEUT%: 44.4 % (ref 38.4–76.8)
Platelets: 206 10*3/uL (ref 145–400)
WBC: 5.6 10*3/uL (ref 3.9–10.3)

## 2011-03-19 ENCOUNTER — Encounter: Payer: Medicare Other | Admitting: Internal Medicine

## 2011-03-19 NOTE — Discharge Summary (Signed)
  Veronica Duarte, Veronica Duarte                ACCOUNT NO.:  [id number]  MEDICAL RECORD NO.:  [id number]           PATIENT TYPE:  I  LOCATION:  2036                         FACILITY:  [facility]  PHYSICIAN:  Evelene Croon, M.D.     DATE OF BIRTH:  [birth year]  DATE OF ADMISSION:  02/14/2011 DATE OF DISCHARGE:  02/18/2011                              DISCHARGE SUMMARY   ADDENDUM  BRIEF HOSPITAL COURSE STAY SINCE LAST DICTATION:  As previously stated, the patient was in atrial fibrillation and was placed on amiodarone, however, she converted to sinus rhythm.  Her amiodarone was discontinued secondary to her history of severe COPD.  She did not have any other evidence of AFib.  She was already on Bystolic preoperatively and had been on this since having had her surgery and this will be continued as an outpatient.  Currently on postop day #4, she is afebrile, heart rate 50s- 60s, BP 96/65, O2 sat 93-94% on 3 liters nasal cannula.  CBGs 102, 91,and 86.  Review chest x-ray done today showed a right small pneumothorax, left lung clear.  There was also some atelectasis on the right.  PHYSICAL EXAMINATION:  CARDIOVASCULAR:  Regular rate and rhythm. PULMONARY:  Slight decrease at the right base, left lung clear. ABDOMEN:  Soft and nontender.  Bowel sounds present. EXTREMITIES:  Trace lower extremity edema. SKIN:  Her right posterior chest wound, the dressing is clean, dry, and intact.  Dr. Laneta Simmers has seen and evaluated the patient and felt she is surgically stable for discharge home with home O2 today.  We will check her O2 sat on room air, but the patient will likely require home oxygen upon discharge.  Only change to the previously dictated medications is that amiodarone has been discontinued.  In addition, her followup appointment has been made to see Dr. Laneta Simmers on March 12, 2011, at 12 p.m.  45 minutes prior to this office appointment, a chest x-ray will be obtained.  The patient has an appointment to  have chest tube suture removed on February 25, 2011, at 10 a.m.  This is an appointment to see the nurse only.     Doree Fudge, PA   ______________________________ Evelene Croon, M.D.    DZ/MEDQ  D:  02/18/2011  T:  02/19/2011  Job:  [id number]  cc:   Dr. Maple Hudson  Electronically Signed by Doree Fudge PA on 02/28/2011 08:38:44 AM Electronically Signed by Evelene Croon M.D. on 03/19/2011 11:23:33 AM

## 2011-03-19 NOTE — Discharge Summary (Signed)
Veronica Duarte, Veronica Duarte                ACCOUNT NO.:  [id number]  MEDICAL RECORD NO.:  [id number]           PATIENT TYPE:  I  LOCATION:  2036                         FACILITY:  [facility]  PHYSICIAN:  Evelene Croon, M.D.     DATE OF BIRTH:  1946/10/06  DATE OF ADMISSION:  02/14/2011 DATE OF DISCHARGE:  02/18/2011                              DISCHARGE SUMMARY   PRIMARY ADMITTING DIAGNOSIS:  Right upper lobe non-small cell carcinoma.  ADDITIONAL/DISCHARGE DIAGNOSES: 1. Right upper lobe non-small cell carcinoma. 2. Severe chronic obstructive pulmonary disease. 3. Coronary artery disease status post emergency coronary artery     bypass grafting in October 2010, by Dr. Laneta Simmers. 4. Type 2 diabetes mellitus. 5. Hypertension. 6. Hypercholesterolemia. 7. Obstructive sleep apnea on BiPAP. 8. Depression. 9. Osteoarthritis. 10.Status post multiple surgeries to the left hand after an infected     cat bite in November 2011. 11.Remote history of tobacco abuse. 12.Postoperative atrial fibrillation.  PROCEDURES PERFORMED:  Right VATS with right upper lobe wedge resection.  HISTORY:  The patient is a 65 year old female who is known to [facility] from her previous emergency coronary artery bypass grafting in October 2010, by Dr. Laneta Simmers.  She was recently referred to Dr. Laneta Simmers for evaluation of a right upper lobe lung mass.  She sustained a small puncture wound on her left hand in November 2011, from her cath which subsequently became infected and required multiple surgical procedures.  During the course of her workup preoperatively for her I and D, she was noted to have a small peripheral right upper lobe nodule on chest x-ray.  CT was obtained which showed a 1.2-cm peripheral right upper lobe lung nodule with no other abnormalities.  A PET scan showed hypermetabolic uptake within the nodule with an SUV of 7.7, and no other abnormal activity.  A CT of the head was negative for metastatic disease.  She then  underwent a CT-guided needle biopsy of the right upper lobe nodule which showed non-small cell lung cancer, most likely adenocarcinoma.  Dr. Laneta Simmers reviewed her films and agreed with the need for VATS with a wedge resection at this time.  All risks, benefits, and alternatives of surgery were explained to the patient.  She agreed to proceed.  HOSPITAL COURSE:  [name] was admitted to [facility] on February 14, 2011, and was taken to the operating room where she underwent right VATS with right upper lobe wedge resection by Dr. Laneta Simmers.  Please see previously dictated operative report for complete details of surgery. She tolerated the procedure well and was transferred to the SICU in stable condition.  Her intraoperative frozen section was positive for non-small-cell carcinoma with negative margins.  Her final pathology remains pending at the time of this dictation.  Overall, she has done well postoperatively.  Her chest tube has been removed in the standard fashion and her followup chest x-ray shows no evidence of pneumothorax.She did have a brief run of atrial fibrillation on postop day #2 and was treated with 1 dose of IV Lopressor and did convert back to normal sinus rhythm.  She has also been started on  p.o. amiodarone and since that time, has had no additional arrhythmias.  She is otherwise doing well. She is ambulating in the halls without problem and is tolerating a regular diet.  She remains on 2-3 liters of supplemental oxygen but this is currently being weaned and will hopefully be discontinued within the next 24 hours.  Her incisions are all healing well.  Her most recent labs show a sodium of 139, potassium 3.8, BUN 8, creatinine 0.63, white count 7.3, hemoglobin 10.8, hematocrit 36.1, platelets 180.  Chest x-ray shows a small pleural effusion and no pneumothorax.  It is felt that she continues to remain stable over the next 24 hours without any changes in her rhythm.  She will  hopefully be discharged home on February 18, 2011.  DISCHARGE MEDICATIONS: 1. Amiodarone 400 mg b.i.d. x1 week, then 200 mg b.i.d. 2. Percocet 5/325, 1-2 q.4 hours p.r.n. for pain. 3. Enteric-coated aspirin 81 mg daily. 4. Bystolic 10 mg daily. 5. Crestor 20 mg at bedtime. 6. Cymbalta 60 mg daily. 7. Metformin 500 mg b.i.d. 8. Multivitamin daily. 9. Niaspan 1000 mg daily. 10.Potassium over the counter 1 tablet daily. 11.Spiriva 18 mcg daily. 12.Symbicort 2 puffs b.i.d. 13.Vitamin C 1 tablet b.i.d. 14.Zetia 10 mg at bedtime.  DISCHARGE INSTRUCTIONS:  She is asked to refrain from driving, heavy lifting, or strenuous activity.  She may continue ambulating daily and using her incentive spirometer.  She may shower daily and clean her incisions with soap and water.  She will continue her same preoperative diet.  DISCHARGE FOLLOWUP:  She will need to make an appointment to see Dr. Maple Hudson as directed.  She will see Dr. Laneta Simmers in 3 weeks with an x-ray. She will also return to our office in 1 week for suture removal with the nurse and wound recheck.  In the interim, if she experiences any problems or has questions, she is asked to contact our office immediately.     Coral Ceo, P.A.   ______________________________ Evelene Croon, M.D.    GC/MEDQ  D:  02/17/2011  T:  02/17/2011  Job:  [id number]  cc:   Nanetta Batty, M.D. [place] D. Maple Hudson, MD, [facility], FACP  Electronically Signed by Weldon Inches. on 02/19/2011 10:36:12 AM Electronically Signed by Evelene Croon M.D. on 03/19/2011 11:23:31 AM

## 2011-03-22 ENCOUNTER — Ambulatory Visit: Payer: Medicare Other | Admitting: Internal Medicine

## 2011-04-02 NOTE — Assessment & Plan Note (Signed)
OFFICE VISIT   Veronica Duarte, Veronica Duarte  DOB:  Jun 10, 1946                                        September 26, 2009  CHART #:  [id number]   HISTORY OF PRESENT ILLNESS:  The patient is status post emergency  coronary artery bypass grafting x2 using a saphenous vein graft to first  obtuse marginal branch and a saphenous vein graft to distal right  coronary artery.  Endoscopic vein harvesting from the right leg was  done. This was done by Dr. Laneta Simmers on August 30, 2009.  Postoperatively,  the patient did fairly well.  She did have an episode of atrial  fibrillation, but was able to convert back to sinus rhythm with medical  treatment.  She does have a history of COPD and she did require to be  discharged to home on oxygen.  The patient presents today for her 3-week  followup visit.  She has seen Dr. Tresa Endo already.  She saw him last week.  At that time, the patient was noted to be in sinus rhythm.  Her heart  rate was in the 80s, blood pressure stable, and he increased her beta-  blocker.  Today, the patient feels that she is doing well.  She is with  her daughter.  She is off all narcotic pain medicines.  She is taking  Tylenol and Motrin as needed.  The patient is ambulating daily, daughter  states not as much as she should.  The patient's energy is still low,  but improving slowly.  She is weaning herself off the oxygen and has  been off the oxygen all day today.  She was without complaints of  increasing shortness of breath.  The patient still has a poor appetite.  She is eating what she can.  She does complain that over the weekend,  she has developed a dull pain, mid upper abdomen.  She denies any  radiation of this pain or vomiting associated with nausea.  Currently on  today's presentation, this has improved.  The patient does have history  of ulcers in the past and plans to follow up with her primary care  physician.  Last bowel movement was today, normal in  color.   PHYSICAL EXAMINATION:  Vital Signs:  Blood pressure 121/71, pulse 74,  respirations 16, and O2 sats 90-91% on room air.  Respiratory:  Positive  crackles on the right.  Cardiac:  Regular rate and rhythm.  No murmurs,  gallops, or rubs noted.  Lungs:  Sternum is stable.  Abdomen:  Bowel  sounds x4.  Soft and nontender.  Extremities:  No edema noted.  Warm and  well perfused.  Incisions:  All incisions are clean, dry, and intact and  healing well.   STUDIES:  The patient had a PA and lateral chest x-ray done today, which  shows very small bilateral pleural effusions with improved aeration.  She had blood work done at Dr. Landry Dyke office, which shows white count  7.1, hemoglobin 10.7, hematocrit 34.2, and platelet count of 462.  Sodium of 142, potassium 4.6, chloride of 105, bicarbonate 27, BUN of  16, creatinine 0.9, and glucose of 103.   IMPRESSION AND PLAN:  The patient is progressing status post emergency  coronary artery bypass grafting.  Plan is to continue the patient on all  current medications.  She is  noted to be in normal sinus rhythm.  It was  discussed with the patient and daughter to continue weaning off oxygen.  As currently, the patient is 90-91% on room air, continue oxygen as  needed.  Daughter states she will bring home pulse ox check O2 sats.  The patient was told to follow up with her regular doctor regarding this  vague abdominal pain.  States she does have history of ulcers and  daughter plans to call today.  She is encouraged to continue ambulating  3-4 times per day.  She will contact cardiac rehab when she gets home  and schedule a start date.  She is instructed no heavy lifting over 10  pounds for another month.  She is instructed still no driving about 2-3  more weeks until her strength continues to improve.  It is felt that the  patient is stable from a surgical standpoint, and she will follow up  p.r.n.  She was told at any time if she has any  surgical questions, she  can contact us.  The patient and daughter are in agreement.   Evelene Croon, M.D.  Electronically Signed   KMD/MEDQ  D:  09/26/2009  T:  09/26/2009  Job:  [id number]   cc:   Nicki Guadalajara, M.D.

## 2011-04-05 IMAGING — CR DG CHEST 2V
2 series · 2 of 2 positions shown · non-contrast
Comparison: 01/30/2011

CLINICAL DATA: Preop chest x-ray

CHEST - 2 VIEW

[view not recorded (1 of 2)]
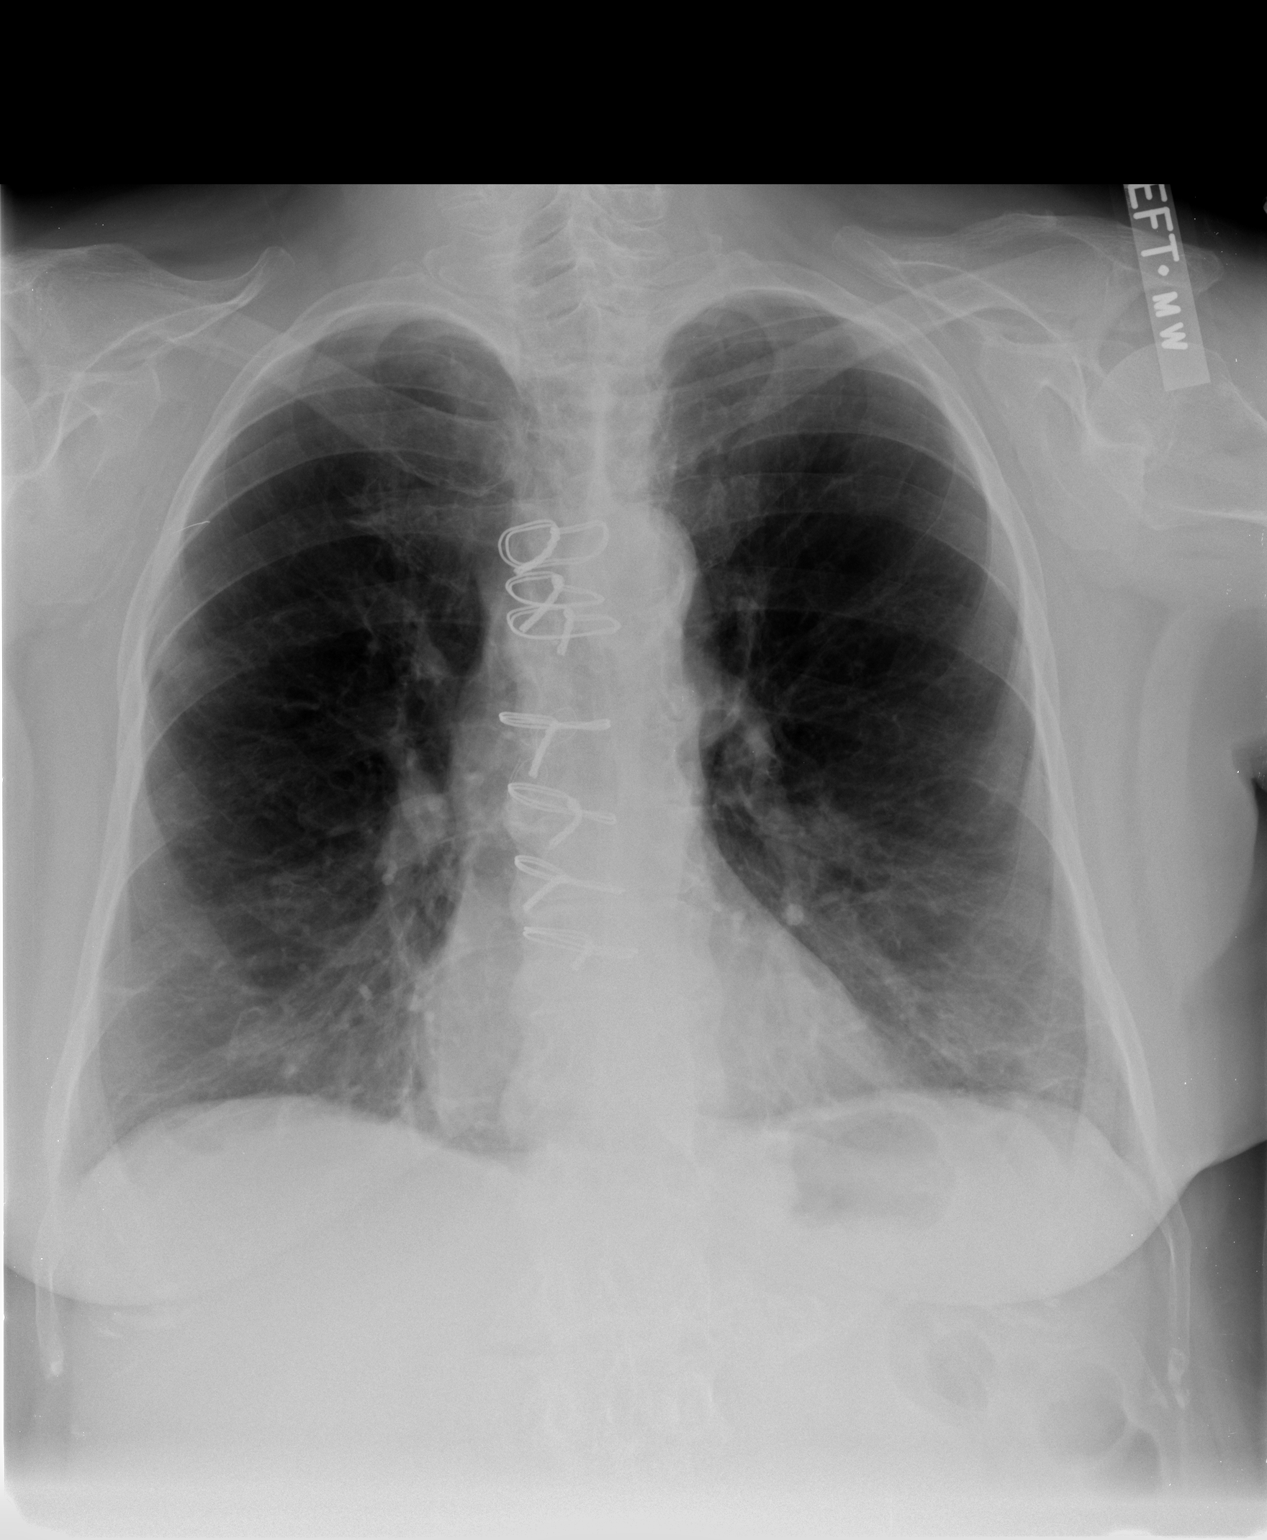

[view not recorded (2 of 2)]
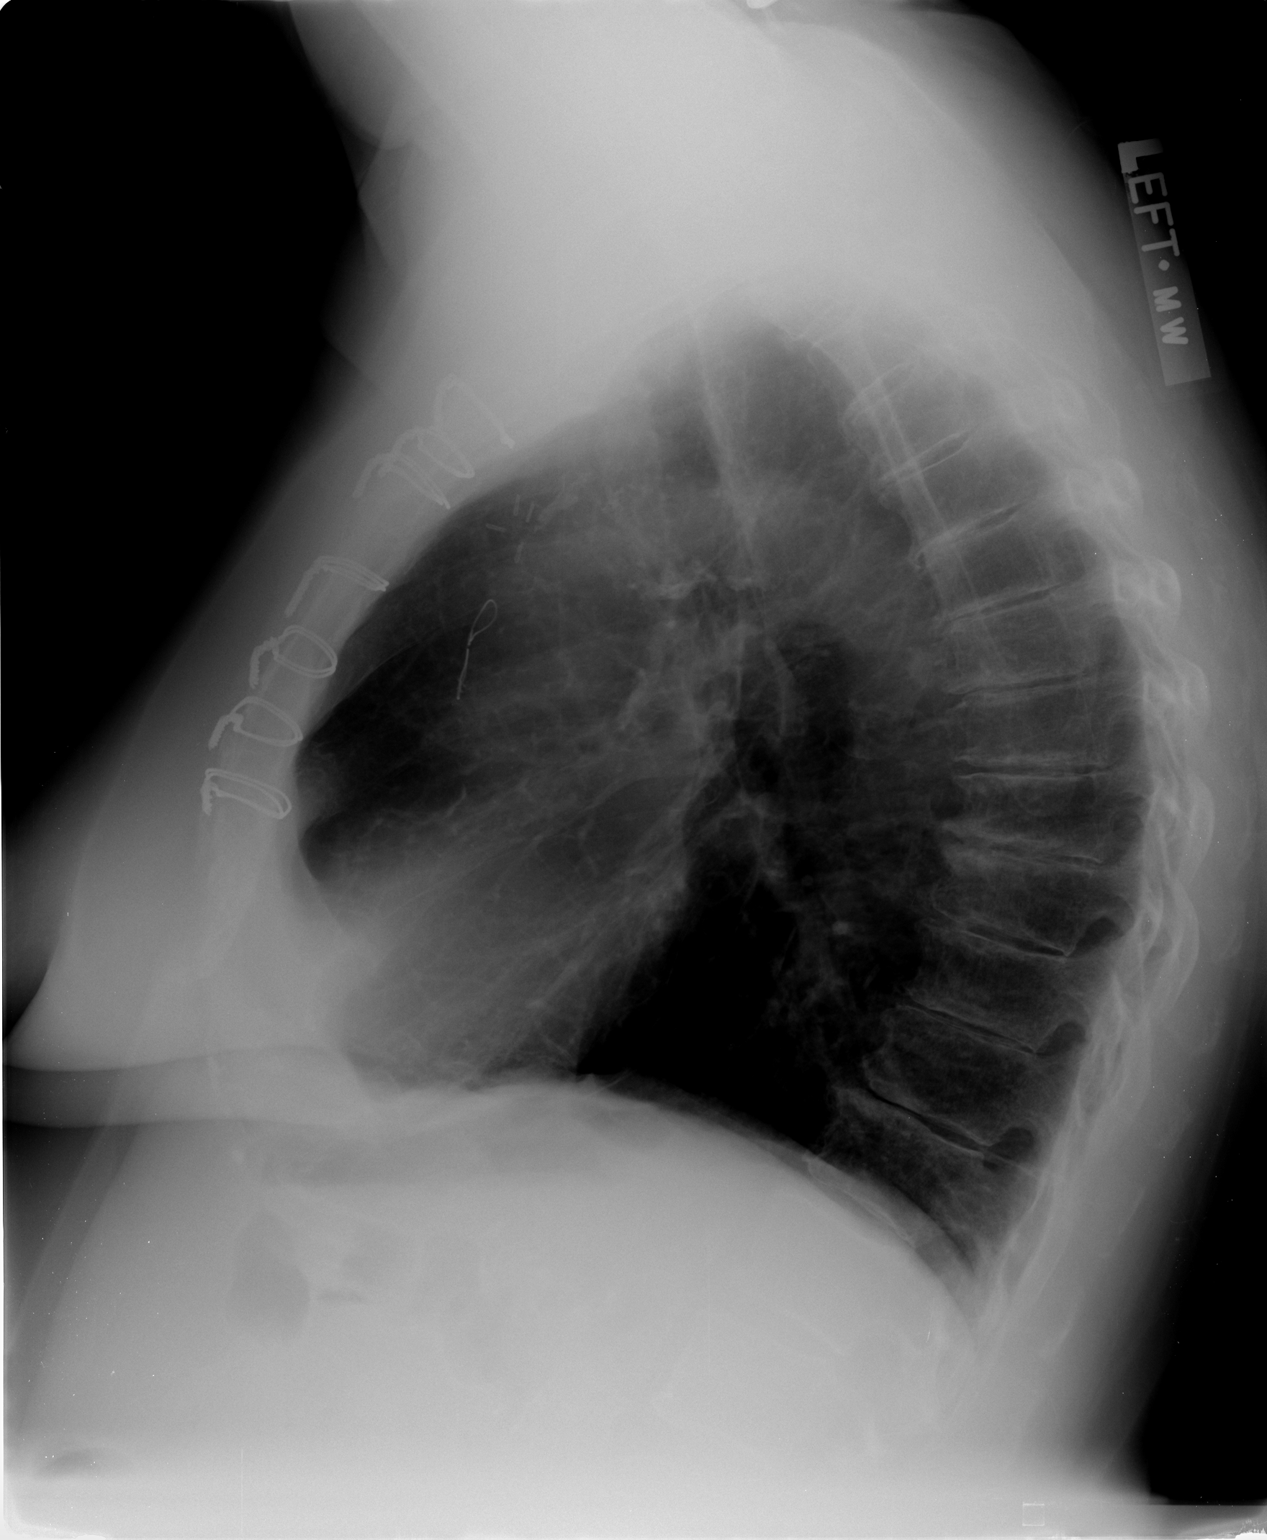

[2 of 2 positions shown; findings below may reference images not displayed]

FINDINGS: Post median sternotomy for CABG.  Heart size normal
without congestive heart failure.  There are chronic lung changes
including COPD.  Right upper lobe nodule faintly visualized.  No
pneumothorax.  No significant change.
IMPRESSION: 1.  Chronic and postoperative changes.
2.  Faintly visualized right upper lobe mass.
3.  No pneumothorax.  Or acute findings.  No change.

## 2011-04-08 IMAGING — CR DG CHEST 1V PORT
1 series · 1 of 1 positions shown · non-contrast
Comparison: [DATE] the

CLINICAL DATA: Chest tube removal

PORTABLE CHEST - 1 VIEW

[AP]
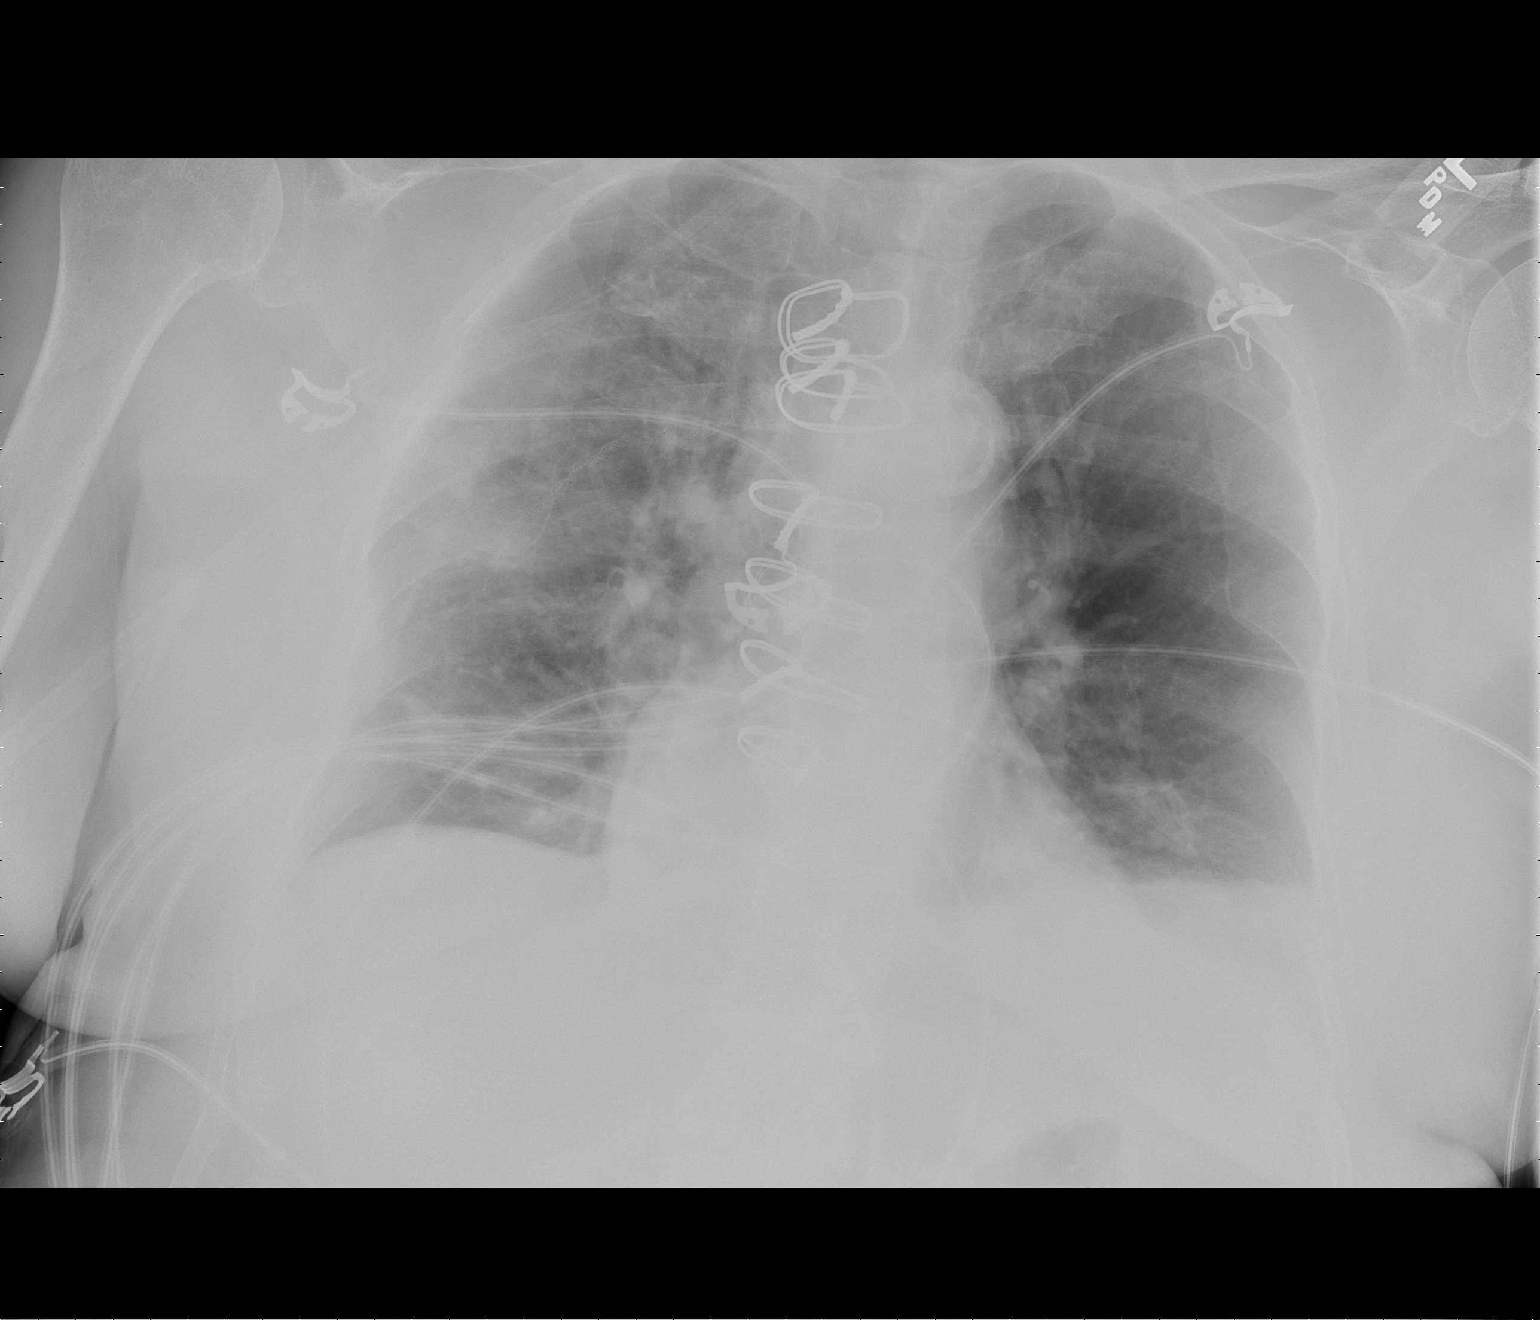

[1 of 1 positions shown; findings below may reference images not displayed]

FINDINGS: Interval removal of the left-sided internal jugular line
and right-sided chest tube.  No pneumothorax is seen.  Lung volumes
remain low and there is pulmonary vascular congestion. Chain
sutures are seen in the right lung.  Bibasilar atelectasis is seen,
left greater than right.  The heart is normal in size.  Sternotomy
wires seen in the midline.  The upper abdomen and osseous
structures are unchanged.
IMPRESSION: 1.  Interval removal of support apparatus.  No pneumothorax is
seen.
2.  Pulmonary vascular congestion and left greater than right base
atelectasis.

## 2011-04-08 IMAGING — CR DG CHEST 1V PORT
1 series · 1 of 1 positions shown · non-contrast
Comparison: 02/15/2011 and 02/14/2011.

CLINICAL DATA: Follow up status post VATS for right upper lobe
mass.

PORTABLE CHEST - 1 VIEW

[AP]
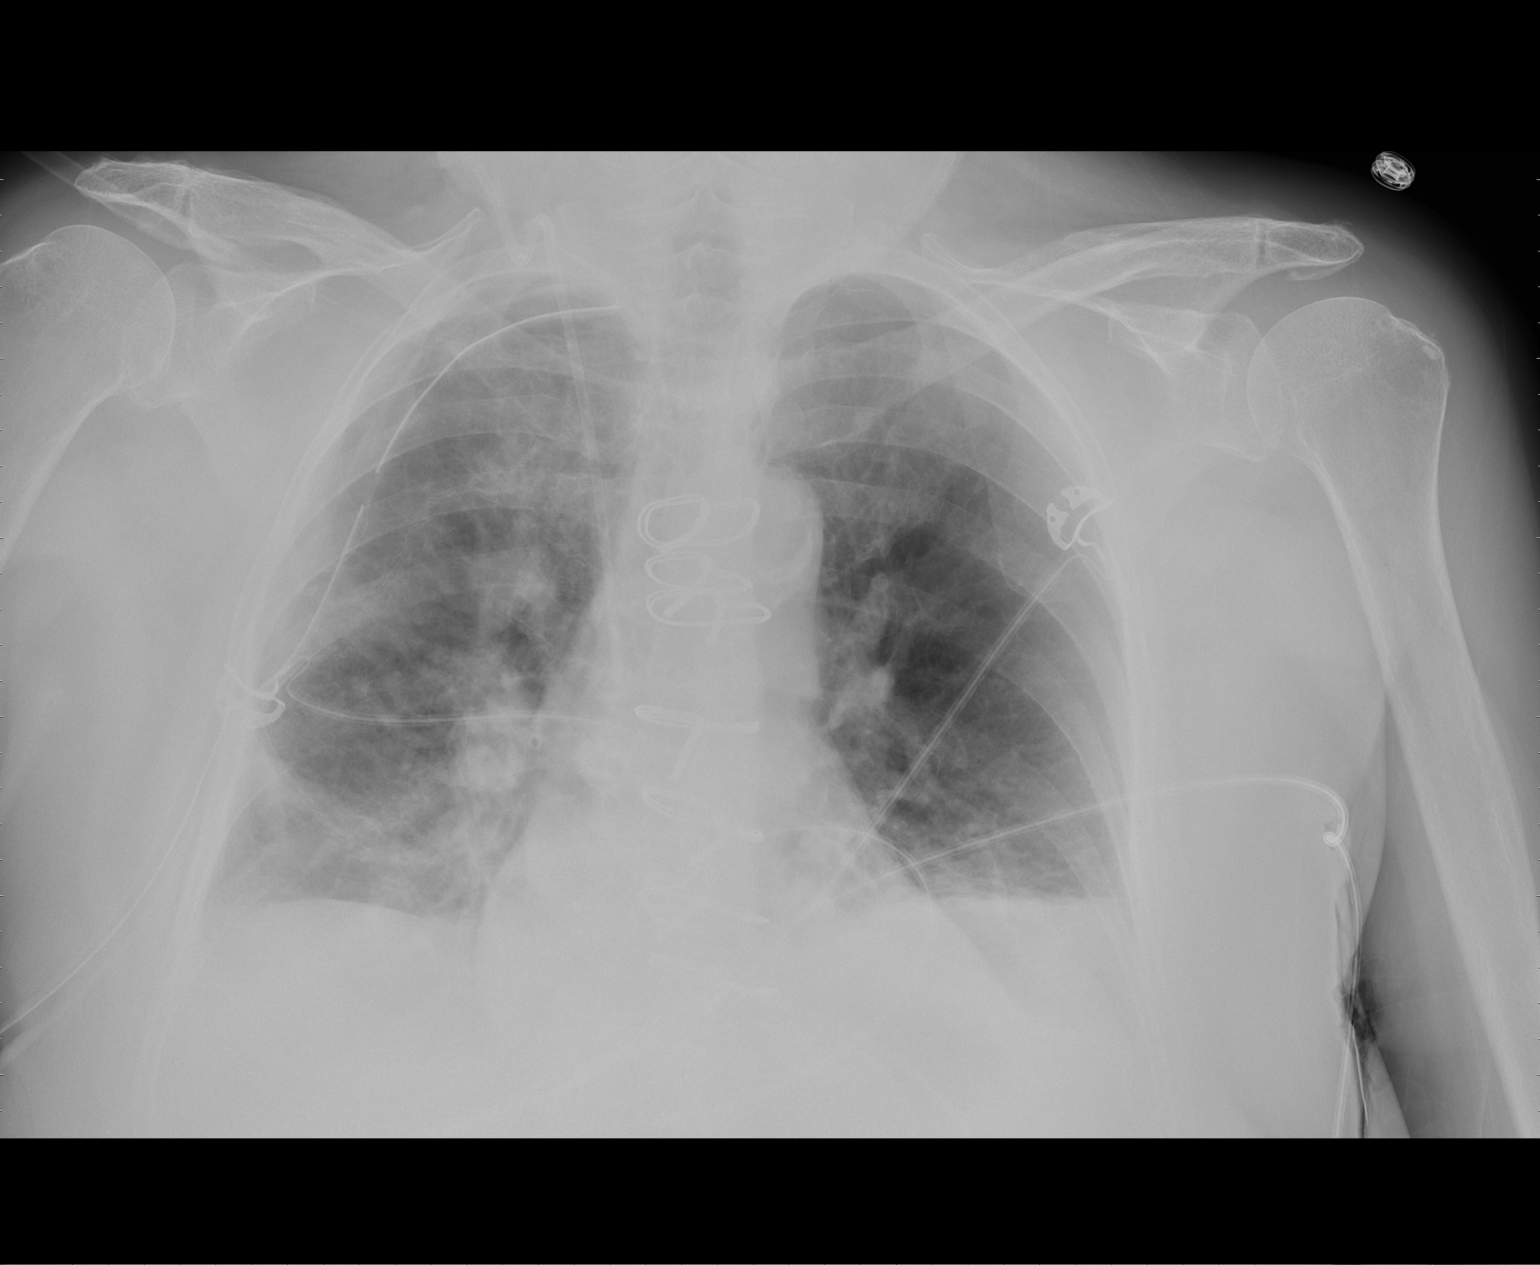

[1 of 1 positions shown; findings below may reference images not displayed]

FINDINGS: 5087 hours.  Right chest tube and right IJ central venous
catheter are unchanged.  There are lower lung volumes with mildly
increased bibasilar atelectasis.  No pneumothorax or significant
pleural effusion is identified.  Heart size and mediastinal
contours are stable status post CABG.
IMPRESSION: Slightly worsened basilar atelectasis.  No pneumothorax.

## 2011-04-17 ENCOUNTER — Telehealth: Payer: Self-pay

## 2011-04-17 MED ORDER — DULOXETINE HCL 60 MG PO CPEP: 60.0000 mg | ORAL_CAPSULE | Freq: Every day | ORAL | Status: DC

## 2011-04-17 MED ORDER — METFORMIN HCL 500 MG PO TABS: 500.0000 mg | ORAL_TABLET | Freq: Two times a day (BID) | ORAL | Status: DC

## 2011-04-17 NOTE — Telephone Encounter (Signed)
Refill request

## 2011-04-17 NOTE — Telephone Encounter (Signed)
Received fax from pharmacy request refill on metformin

## 2011-05-03 ENCOUNTER — Ambulatory Visit (INDEPENDENT_AMBULATORY_CARE_PROVIDER_SITE_OTHER): Payer: Medicare Other | Admitting: Internal Medicine

## 2011-05-03 ENCOUNTER — Encounter: Payer: Self-pay | Admitting: Internal Medicine

## 2011-05-03 VITALS — BP 126/70 | HR 65 | Temp 98.8°F | Resp 16 | Ht 62.0 in | Wt 198.0 lb

## 2011-05-03 VITALS — BP 122/68 | HR 63 | Ht 62.0 in | Wt 199.8 lb

## 2011-05-03 DIAGNOSIS — J449 Chronic obstructive pulmonary disease, unspecified: Secondary | ICD-10-CM

## 2011-05-03 DIAGNOSIS — E119 Type 2 diabetes mellitus without complications: Secondary | ICD-10-CM

## 2011-05-03 DIAGNOSIS — I10 Essential (primary) hypertension: Secondary | ICD-10-CM

## 2011-05-03 DIAGNOSIS — G4733 Obstructive sleep apnea (adult) (pediatric): Secondary | ICD-10-CM

## 2011-05-03 DIAGNOSIS — J4489 Other specified chronic obstructive pulmonary disease: Secondary | ICD-10-CM

## 2011-05-03 DIAGNOSIS — C3410 Malignant neoplasm of upper lobe, unspecified bronchus or lung: Secondary | ICD-10-CM

## 2011-05-03 NOTE — Assessment & Plan Note (Signed)
Her blood sugars are well controlled 

## 2011-05-03 NOTE — Assessment & Plan Note (Signed)
BP is well controlled 

## 2011-05-03 NOTE — Assessment & Plan Note (Signed)
Resected and hopefully cured, with f/u CT planned by Dr Shirline Frees

## 2011-05-03 NOTE — Assessment & Plan Note (Signed)
Good control. Strongly encouraged to maintain exercise.  Discussed available exercise programs.

## 2011-05-03 NOTE — Progress Notes (Signed)
Subjective:    Patient ID: Veronica Duarte, female    DOB: 1946/10/17, 65 y.o.   MRN: [id number]  Diabetes She presents for her follow-up diabetic visit. She has type 2 diabetes mellitus. MedicAlert identification noted. Her disease course has been stable. There are no hypoglycemic associated symptoms. Pertinent negatives for diabetes include no blurred vision, no chest pain, no fatigue, no foot paresthesias, no foot ulcerations, no polydipsia, no polyphagia, no polyuria, no visual change, no weakness and no weight loss. There are no hypoglycemic complications. Symptoms are stable. There are no diabetic complications. Current diabetic treatment includes oral agent (monotherapy). She is compliant with treatment all of the time. Her weight is stable. She is following a generally healthy diet. Meal planning includes avoidance of concentrated sweets. She has had a previous visit with a dietician. She participates in exercise intermittently. There is no change in her home blood glucose trend. Her breakfast blood glucose range is generally 70-90 mg/dl. Her lunch blood glucose range is generally 70-90 mg/dl. Her dinner blood glucose range is generally 70-90 mg/dl. Her highest blood glucose is 70-90 mg/dl. Her overall blood glucose range is 70-90 mg/dl. An ACE inhibitor/angiotensin II receptor blocker is being taken. She does not see a podiatrist.Eye exam is not current.      Review of Systems  Constitutional: Negative for fever, chills, weight loss, diaphoresis, activity change, appetite change, fatigue and unexpected weight change.  HENT: Negative.   Eyes: Negative.  Negative for blurred vision.  Respiratory: Positive for shortness of breath. Negative for apnea, choking, chest tightness, wheezing and stridor.   Cardiovascular: Negative for chest pain, palpitations and leg swelling.  Gastrointestinal: Negative.   Genitourinary: Negative for dysuria, urgency, polyuria, frequency, hematuria, flank pain,  decreased urine volume, enuresis, difficulty urinating and dyspareunia.  Musculoskeletal: Negative.   Neurological: Negative.  Negative for weakness.  Hematological: Negative.  Negative for polydipsia and polyphagia.  Psychiatric/Behavioral: Negative.        Objective:   Physical Exam  Vitals reviewed. Constitutional: She is oriented to person, place, and time. She appears well-developed and well-nourished. No distress.  HENT:  Head: Normocephalic and atraumatic.  Right Ear: External ear normal.  Left Ear: External ear normal.  Nose: Nose normal.  Mouth/Throat: Oropharynx is clear and moist. No oropharyngeal exudate.  Eyes: Conjunctivae and EOM are normal. Pupils are equal, round, and reactive to light. Right eye exhibits no discharge. Left eye exhibits no discharge. No scleral icterus.  Neck: Normal range of motion. Neck supple. No JVD present. No tracheal deviation present. No thyromegaly present.  Cardiovascular: Normal rate, regular rhythm, normal heart sounds and intact distal pulses.  Exam reveals no gallop and no friction rub.   No murmur heard. Pulmonary/Chest: Effort normal and breath sounds normal. No stridor. No respiratory distress. She has no wheezes. She has no rales. She exhibits no tenderness.  Abdominal: Soft. Bowel sounds are normal. She exhibits no distension and no mass. There is no tenderness. There is no rebound and no guarding.  Musculoskeletal: Normal range of motion. She exhibits no edema and no tenderness.  Lymphadenopathy:    She has no cervical adenopathy.  Neurological: She is alert and oriented to person, place, and time. She has normal reflexes. No cranial nerve deficit.  Skin: Skin is warm and dry. No rash noted. She is not diaphoretic. No erythema. No pallor.  Psychiatric: She has a normal mood and affect. Her behavior is normal. Judgment and thought content normal.  Lab Results  Component Value Date   WBC 7.3 02/15/2011   HGB 12.8  03/14/2011   HCT 39.6 03/14/2011   PLT 206 03/14/2011   ALT 18 03/14/2011   AST 24 03/14/2011   NA 142 03/14/2011   K 4.0 03/14/2011   CL 105 03/14/2011   CREATININE 0.76 03/14/2011   BUN 11 03/14/2011   CO2 25 03/14/2011   TSH 1.61 01/02/2011   INR 1.10 02/13/2011   HGBA1C 5.8 01/02/2011   Assessment & Plan:

## 2011-05-03 NOTE — Progress Notes (Signed)
  Subjective:    Patient ID: Veronica Duarte, female    DOB: 09-08-1946, 65 y.o.   MRN: [id number]  HPI 05/03/11- 70 yoF former smoker with COPD, hx Adnoca RUL, OSA.  Here w/ daughter Last here February 04, 2011. Had RUL wedge resection of her lung cancer by Dr Laneta Simmers. Has seen Dr Shirline Frees f/u with no adjuvent needed. She will have a CT in 6 months f/u. Newly found PAFib- Dr Tresa Endo is putting her on Plavix and Diltiazem.  Continues BiPAP 15/10 SMS- "can't sleep without it". Breathing is doing well, with little cough or wheeze. Rare need for rescue inhaler. Used O2 after surgery but no longer needed. Continues Symbicort , usually once daily.  Review of Systems Constitutional:   No weight loss, night sweats,  Fevers, chills, fatigue, lassitude. HEENT:   No headaches,  Difficulty swallowing,  Tooth/dental problems,  Sore throat,                No sneezing, itching, ear ache, nasal congestion, post nasal drip,   CV:  No chest pain, orthopnea, PND, swelling in lower extremities, anasarca, dizziness, palpitations  GI  No heartburn, indigestion, abdominal pain, nausea, vomiting, diarrhea, change in bowel habits, loss of appetite  Resp: No excess mucus, no productive cough,  No non-productive cough,  No coughing up of blood.  No change in color of mucus.  No wheezing.   Skin: no rash or lesions.  GU: no dysuria, change in color of urine, no urgency or frequency.  No flank pain.  MS:  No joint pain or swelling.  No decreased range of motion.  No back pain.  Psych:  No change in mood or affect. No depression or anxiety.  No memory loss.      Objective:   Physical Exam General- Alert, Oriented, Affect-appropriate, Distress- none acute    overweight  Skin- rash-none, lesions- none, excoriation- none  Lymphadenopathy- none  Head- atraumatic  Eyes- Gross vision intact, PERRLA, conjunctivae clear, secretions  Ears- Hearing, canals, Tm - normal Nose- Clear, No- Septal dev, mucus, polyps, erosion,  perforation   Throat- Mallampati II , mucosa clear , drainage- none, tonsils- atrophic  Neck- flexible , trachea midline, no stridor , thyroid nl, carotid no bruit  Chest - symmetrical excursion , unlabored     Heart/CV- RRR/ rare extra beat , no murmur , no gallop  , no rub, nl s1 s2                     - JVD- none , edema- none, stasis changes- none, varices- none     Lung- clear to P&A, diminished, unlabored, wheeze- none, cough- none , dullness-none, rub- none     Chest wall-   Abd- tender-no, distended-no, bowel sounds-present, HSM- no  Br/ Gen/ Rectal- Not done, not indicated  Extrem- cyanosis- none, clubbing, none, atrophy- none, strength- nl  Neuro- grossly intact to observation         Assessment & Plan:

## 2011-05-03 NOTE — Patient Instructions (Signed)
Diabetes, Type 2 Diabetes is a lasting (chronic) disease. In type 2 diabetes, the pancreas does not make enough insulin (a hormone), and the body does not respond normally to the insulin that is made. This type of diabetes was also previously called adult onset diabetes. About 90% of all those who have diabetes have type 2. It usually occurs after the age of 40 but can occur at any age. CAUSES Unlike type 1 diabetes, which happens because insulin is no longer being made, type 2 diabetes happens because the body is making less insulin and has trouble using the insulin properly. SYMPTOMS  Drinking more than usual.   Urinating more than usual.   Blurred vision.   Dry, itchy skin.   Frequent infection like yeast infections in women.   More tired than usual (fatigue).  TREATMENT  Healthy eating.   Exercise.   Medication, if needed.   Monitoring blood glucose (sugar).   Seeing your caregiver regularly.  HOME CARE INSTRUCTIONS  Check your blood glucose (sugar) at least once daily. More frequent monitoring may be necessary, depending on your medications and on how well your diabetes is controlled. Your caregiver will advise you.   Take your medicine as directed by your caregiver.   Do not smoke.   Make wise food choices. Ask your caregiver for information. Weight loss can improve your diabetes.   Learn about low blood glucose (hypoglycemia) and how to treat it.   Get your eyes checked regularly.   Have a yearly physical exam. Have your blood pressure checked. Get your blood and urine tested.   Wear a pendant or bracelet saying that you have diabetes.   Check your feet every night for sores. Let your caregiver know if you have sores that are not healing.  SEEK MEDICAL CARE IF:  You are having problems keeping your blood glucose at target range.   You feel you might be having problems with your medicines.   You have symptoms of an illness that is not improving after 24  hours.   You have a sore or wound that is not healing.   You notice a change in vision or a new problem with your vision.   You develop a fever of more than 100.5.  Document Released: 11/04/2005 Document Re-Released: 11/26/2009 [employer] Patient Information 2011 [employer]. 

## 2011-05-03 NOTE — Patient Instructions (Signed)
Symbicort is ok used just once daily, but if your breathing isn't good, then be quick to go back up to twice daily. Call if needed.

## 2011-05-05 ENCOUNTER — Encounter: Payer: Self-pay | Admitting: Internal Medicine

## 2011-05-05 NOTE — Assessment & Plan Note (Signed)
Continues compliant with BiPAP

## 2011-05-07 ENCOUNTER — Other Ambulatory Visit: Payer: Self-pay | Admitting: *Deleted

## 2011-05-07 MED ORDER — BUDESONIDE-FORMOTEROL FUMARATE 160-4.5 MCG/ACT IN AERO: 2.0000 | INHALATION_SPRAY | Freq: Two times a day (BID) | RESPIRATORY_TRACT | Status: DC

## 2011-06-10 ENCOUNTER — Other Ambulatory Visit: Payer: Self-pay | Admitting: Surgery

## 2011-06-10 DIAGNOSIS — D381 Neoplasm of uncertain behavior of trachea, bronchus and lung: Secondary | ICD-10-CM

## 2011-06-11 ENCOUNTER — Encounter: Payer: Medicare Other | Admitting: Surgery

## 2011-06-25 ENCOUNTER — Encounter: Payer: Medicare Other | Admitting: Surgery

## 2011-07-09 ENCOUNTER — Encounter: Payer: Medicare Other | Admitting: Surgery

## 2011-07-15 ENCOUNTER — Other Ambulatory Visit: Payer: Self-pay | Admitting: Surgery

## 2011-07-15 ENCOUNTER — Ambulatory Visit
Admission: RE | Admit: 2011-07-15 | Discharge: 2011-07-15 | Disposition: A | Discharge status: Home / Self Care | Payer: Medicare Other | Source: Ambulatory Visit | Attending: Surgery | Admitting: Surgery

## 2011-07-15 DIAGNOSIS — I2510 Atherosclerotic heart disease of native coronary artery without angina pectoris: Secondary | ICD-10-CM | POA: Insufficient documentation

## 2011-07-15 DIAGNOSIS — D381 Neoplasm of uncertain behavior of trachea, bronchus and lung: Secondary | ICD-10-CM

## 2011-07-15 DIAGNOSIS — C3410 Malignant neoplasm of upper lobe, unspecified bronchus or lung: Secondary | ICD-10-CM

## 2011-07-16 ENCOUNTER — Encounter: Payer: Medicare Other | Admitting: Surgery

## 2011-07-16 ENCOUNTER — Encounter: Payer: Self-pay | Admitting: Surgery

## 2011-07-16 ENCOUNTER — Ambulatory Visit (INDEPENDENT_AMBULATORY_CARE_PROVIDER_SITE_OTHER): Payer: Medicare Other | Admitting: Surgery

## 2011-07-16 VITALS — BP 116/69 | HR 60 | Resp 18 | Ht 64.0 in | Wt 188.0 lb

## 2011-07-16 DIAGNOSIS — C3410 Malignant neoplasm of upper lobe, unspecified bronchus or lung: Secondary | ICD-10-CM

## 2011-07-16 DIAGNOSIS — Z09 Encounter for follow-up examination after completed treatment for conditions other than malignant neoplasm: Secondary | ICD-10-CM

## 2011-07-16 NOTE — Progress Notes (Signed)
HPI  Patient returns for routine postoperative follow-up having undergone right video-assisted thoracoscopy with wedge resection of right upper lobe lung cancer on 02/14/2011. The patient's early postoperative recovery while in the hospital was notable for brief atrial fibrillation.Veronica Duarte was treated with Lopressor and amiodarone.. Veronica Duarte said that Veronica Duarte had an event monitor placed by Dr. Aleen Campi and only had a few very brief episodes of atrial fibrillation. Her final pathology showed a T2 A., N0, M0 adenocarcinoma of the lung. Veronica Duarte was seen by Dr. Si Gaul for medical oncology consultation and he did not feel that any adjuvant therapy was indicated.  Since I last saw her on 03/12/2011 Veronica Duarte's continued to feel better. Veronica Duarte is rarely taking any pain medicine. Her daughter is concerned Veronica Duarte is still not walking that much and tends to stay at home a lot. Veronica Duarte does report mild dyspnea on exertion. Veronica Duarte has mild chest wall soreness around the right chest incisions. Veronica Duarte denies any cough or sputum production.Veronica Duarte has had no headaches or dizziness.    Current Outpatient Prescriptions  Medication Sig Dispense Refill  . albuterol (PROAIR HFA) 108 (90 BASE) MCG/ACT inhaler Inhale 2 puffs into the lungs every 6 (six) hours as needed.        Marland Kitchen aspirin 81 MG tablet Take 81 mg by mouth daily.        . budesonide-formoterol (SYMBICORT) 160-4.5 MCG/ACT inhaler Inhale 2 puffs into the lungs 2 (two) times daily.  1 Inhaler  6  . clopidogrel (PLAVIX) 75 MG tablet Take 75 mg by mouth daily.        Marland Kitchen diltiazem (DILACOR XR) 120 MG 24 hr capsule Take 120 mg by mouth at bedtime.        . DULoxetine (CYMBALTA) 60 MG capsule Take 1 capsule (60 mg total) by mouth daily.  30 capsule  3  . ezetimibe (ZETIA) 10 MG tablet Take 10 mg by mouth daily.        Marland Kitchen HYDROcodone-acetaminophen (VICODIN) 5-500 MG per tablet Take 1 tablet by mouth every 6 (six) hours as needed.        . metFORMIN (GLUCOPHAGE) 500 MG tablet Take 1 tablet  (500 mg total) by mouth 2 (two) times daily with a meal.  60 tablet  6  . Multiple Vitamin (MULTIVITAMIN) tablet Take 1 tablet by mouth daily.        . Nebivolol HCl (BYSTOLIC) 20 MG TABS Take 1 tablet by mouth daily.        . niacin (NIASPAN) 1000 MG CR tablet Take 1,000 mg by mouth at bedtime.        . Potassium 99 MG TABS Take by mouth daily.        . rosuvastatin (CRESTOR) 20 MG tablet Take 20 mg by mouth daily.        . valsartan (DIOVAN) 160 MG tablet Take 160 mg by mouth daily.        . vitamin C (ASCORBIC ACID) 500 MG tablet Take 500 mg by mouth 2 (two) times daily.        Marland Kitchen zolpidem (AMBIEN) 10 MG tablet Take 1 tablet by mouth 1 day or 1 dose.          Physical Exam  Constitutional: Veronica Duarte is oriented to person, place, and time. Veronica Duarte appears well-developed and well-nourished.  HENT:  Mouth/Throat: Oropharynx is clear and moist.  Eyes: Conjunctivae are normal. Pupils are equal, round, and reactive to light.  Neck: No tracheal deviation present. No thyromegaly present.  Cardiovascular: Normal rate, regular rhythm and normal heart sounds.   Pulmonary/Chest: Effort normal and breath sounds normal. No respiratory distress.  Abdominal: Soft. Bowel sounds are normal. Veronica Duarte exhibits no mass. There is no tenderness.  Lymphadenopathy:    Veronica Duarte has no cervical adenopathy.    Veronica Duarte has no axillary adenopathy.       Right: No supraclavicular adenopathy present.       Left: No supraclavicular adenopathy present.  Neurological: Veronica Duarte is alert and oriented to person, place, and time. Veronica Duarte has normal strength. No sensory deficit.     Diagnostic tests:  Chest x-ray today shows postoperative changes. There is no sign of recurrent cancer.  Impression:  Overall Veronica Duarte is doing well 5 months following lung cancer resection. I encouraged her to continue increasing her activity.  Plan:  Veronica Duarte has a followup appointment to see Dr. Si Gaul in October 2012 with a CT scan of the chest. I told her  Veronica Duarte did not need to return to see me for followup if Veronica Duarte is going to be followed by Dr. Arbutus Ped. I stressed the importance of continued followup for the rest of her life.

## 2011-07-16 NOTE — Progress Notes (Deleted)
  HPI  Patient returns for routine postoperative follow-up having undergone *** on ***. The patient's early postoperative recovery while in the hospital was notable for ***. Since hospital discharge the patient reports ***.    Current Outpatient Prescriptions  Medication Sig Dispense Refill  . albuterol (PROAIR HFA) 108 (90 BASE) MCG/ACT inhaler Inhale 2 puffs into the lungs every 6 (six) hours as needed.        Marland Kitchen aspirin 81 MG tablet Take 81 mg by mouth daily.        . budesonide-formoterol (SYMBICORT) 160-4.5 MCG/ACT inhaler Inhale 2 puffs into the lungs 2 (two) times daily.  1 Inhaler  6  . clopidogrel (PLAVIX) 75 MG tablet Take 75 mg by mouth daily.        Marland Kitchen diltiazem (DILACOR XR) 120 MG 24 hr capsule Take 120 mg by mouth at bedtime.        . DULoxetine (CYMBALTA) 60 MG capsule Take 1 capsule (60 mg total) by mouth daily.  30 capsule  3  . ezetimibe (ZETIA) 10 MG tablet Take 10 mg by mouth daily.        Marland Kitchen HYDROcodone-acetaminophen (VICODIN) 5-500 MG per tablet Take 1 tablet by mouth every 6 (six) hours as needed.        . metFORMIN (GLUCOPHAGE) 500 MG tablet Take 1 tablet (500 mg total) by mouth 2 (two) times daily with a meal.  60 tablet  6  . Multiple Vitamin (MULTIVITAMIN) tablet Take 1 tablet by mouth daily.        . Nebivolol HCl (BYSTOLIC) 20 MG TABS Take 1 tablet by mouth daily.        . niacin (NIASPAN) 1000 MG CR tablet Take 1,000 mg by mouth at bedtime.        . Potassium 99 MG TABS Take by mouth daily.        . rosuvastatin (CRESTOR) 20 MG tablet Take 20 mg by mouth daily.        . valsartan (DIOVAN) 160 MG tablet Take 160 mg by mouth daily.        . vitamin C (ASCORBIC ACID) 500 MG tablet Take 500 mg by mouth 2 (two) times daily.        Marland Kitchen zolpidem (AMBIEN) 10 MG tablet Take 1 tablet by mouth 1 day or 1 dose.          Physical Exam   Diagnostic tests:  ***  Impression:  ***  Plan:  ***

## 2011-08-15 ENCOUNTER — Telehealth: Payer: Self-pay | Admitting: Internal Medicine

## 2011-08-15 MED ORDER — ALBUTEROL SULFATE HFA 108 (90 BASE) MCG/ACT IN AERS: 2.0000 | INHALATION_SPRAY | Freq: Four times a day (QID) | RESPIRATORY_TRACT | Status: DC | PRN

## 2011-08-15 NOTE — Telephone Encounter (Signed)
Spoke with pt's daughter and notified rx was sent.

## 2011-08-29 ENCOUNTER — Other Ambulatory Visit: Payer: Self-pay | Admitting: Internal Medicine

## 2011-09-06 ENCOUNTER — Other Ambulatory Visit: Payer: Self-pay | Admitting: Internal Medicine

## 2011-09-06 ENCOUNTER — Ambulatory Visit (HOSPITAL_COMMUNITY)
Admission: RE | Admit: 2011-09-06 | Discharge: 2011-09-06 | Disposition: A | Discharge status: Home / Self Care | Payer: Medicare Other | Source: Ambulatory Visit | Attending: Internal Medicine | Admitting: Internal Medicine

## 2011-09-06 ENCOUNTER — Encounter (HOSPITAL_BASED_OUTPATIENT_CLINIC_OR_DEPARTMENT_OTHER): Payer: Medicare Other | Admitting: Internal Medicine

## 2011-09-06 DIAGNOSIS — I700 Atherosclerosis of aorta: Secondary | ICD-10-CM | POA: Insufficient documentation

## 2011-09-06 DIAGNOSIS — C3490 Malignant neoplasm of unspecified part of unspecified bronchus or lung: Secondary | ICD-10-CM

## 2011-09-06 DIAGNOSIS — R911 Solitary pulmonary nodule: Secondary | ICD-10-CM | POA: Insufficient documentation

## 2011-09-06 DIAGNOSIS — Q619 Cystic kidney disease, unspecified: Secondary | ICD-10-CM | POA: Insufficient documentation

## 2011-09-06 LAB — CBC WITH DIFFERENTIAL/PLATELET
EOS%: 2.3 % (ref 0.0–7.0)
MCH: 29.5 pg (ref 25.1–34.0)
MCV: 86.7 fL (ref 79.5–101.0)
MONO%: 6.4 % (ref 0.0–14.0)
RBC: 4.95 10*6/uL (ref 3.70–5.45)
RDW: 14.1 % (ref 11.2–14.5)

## 2011-09-06 LAB — CMP (CANCER CENTER ONLY)
ALT(SGPT): 30 U/L (ref 10–47)
AST: 24 U/L (ref 11–38)
Albumin: 3.5 g/dL (ref 3.3–5.5)
Alkaline Phosphatase: 55 U/L (ref 26–84)
Potassium: 3.9 mEq/L (ref 3.3–4.7)
Sodium: 145 mEq/L (ref 128–145)
Total Protein: 6.9 g/dL (ref 6.4–8.1)

## 2011-09-06 MED ORDER — IOHEXOL 300 MG/ML  SOLN
80.0000 mL | Freq: Once | INTRAMUSCULAR | Status: AC | PRN
  Administered 2011-09-06: 80 mL via INTRAVENOUS

## 2011-09-16 ENCOUNTER — Other Ambulatory Visit (HOSPITAL_COMMUNITY): Payer: Self-pay | Admitting: Internal Medicine

## 2011-09-16 ENCOUNTER — Encounter (HOSPITAL_BASED_OUTPATIENT_CLINIC_OR_DEPARTMENT_OTHER): Payer: Medicare Other | Admitting: Internal Medicine

## 2011-09-16 ENCOUNTER — Other Ambulatory Visit: Payer: Self-pay | Admitting: Internal Medicine

## 2011-09-16 DIAGNOSIS — C3490 Malignant neoplasm of unspecified part of unspecified bronchus or lung: Secondary | ICD-10-CM

## 2011-09-16 DIAGNOSIS — I10 Essential (primary) hypertension: Secondary | ICD-10-CM

## 2011-09-16 DIAGNOSIS — C3410 Malignant neoplasm of upper lobe, unspecified bronchus or lung: Secondary | ICD-10-CM

## 2011-09-16 DIAGNOSIS — E119 Type 2 diabetes mellitus without complications: Secondary | ICD-10-CM

## 2011-11-04 ENCOUNTER — Encounter: Payer: Self-pay | Admitting: Internal Medicine

## 2011-11-04 ENCOUNTER — Ambulatory Visit (INDEPENDENT_AMBULATORY_CARE_PROVIDER_SITE_OTHER): Payer: Medicare Other | Admitting: Internal Medicine

## 2011-11-04 VITALS — BP 118/64 | HR 64 | Ht 62.0 in | Wt 203.6 lb

## 2011-11-04 DIAGNOSIS — J4489 Other specified chronic obstructive pulmonary disease: Secondary | ICD-10-CM

## 2011-11-04 DIAGNOSIS — J449 Chronic obstructive pulmonary disease, unspecified: Secondary | ICD-10-CM

## 2011-11-04 DIAGNOSIS — C3410 Malignant neoplasm of upper lobe, unspecified bronchus or lung: Secondary | ICD-10-CM

## 2011-11-04 DIAGNOSIS — G4733 Obstructive sleep apnea (adult) (pediatric): Secondary | ICD-10-CM

## 2011-11-04 MED ORDER — TIOTROPIUM BROMIDE MONOHYDRATE 18 MCG IN CAPS: 18.0000 ug | ORAL_CAPSULE | Freq: Every day | RESPIRATORY_TRACT | Status: DC

## 2011-11-04 NOTE — Progress Notes (Signed)
Subjective:    Patient ID: Veronica Duarte, female    DOB: 1946/05/15, 65 y.o.   MRN: [id number]  HPI 05/03/11- 37 yoF former smoker with COPD, hx Adnoca RUL, OSA.  Here w/ daughter Last here February 04, 2011. Had RUL wedge resection of her lung cancer by Dr Laneta Simmers. Has seen Dr Shirline Frees f/u with no adjuvent needed. She will have a CT in 6 months f/u. Newly found PAFib- Dr Tresa Endo is putting her on Plavix and Diltiazem.  Continues BiPAP 15/10 SMS- "can't sleep without it". Breathing is doing well, with little cough or wheeze. Rare need for rescue inhaler. Used O2 after surgery but no longer needed. Continues Symbicort , usually once daily.   11/04/11-  1 yoF former smoker with COPD, hx Adnoca RUL, OSA. complicated by AFib  Here w/ daughter Has had flu vaccine. She got a good report on her six-month cancer followup CT scan, from Dr. Shirline Frees. She now reports her breathing has been worse for 3 weeks. She had had a cold 3 or 4 days around Thanksgiving. She has The raspy rattle with cough associated with her chronic bronchitis. She never filled prescription for Spiriva because of cost. She denies purulent or bloody sputum, chest pain, fever or nodes. Sleep apnea is well managed with BiPAP per Dr. Tresa Endo. Her paroxysmal atrial fibrillation has not recurred since she has been on diltiazem and Plavix.  Review of Systems Constitutional:   No-   weight loss, night sweats, fevers, chills, fatigue, lassitude. HEENT:   No-  headaches, difficulty swallowing, tooth/dental problems, sore throat,       No-  sneezing, itching, ear ache, nasal congestion, post nasal drip,  CV:  No-   chest pain, orthopnea, PND, swelling in lower extremities, anasarca,                                  dizziness, palpitations Resp: No- acute  shortness of breath with exertion or at rest.              No-   productive cough,  chronic non-productive cough,  No- coughing up of blood.              No-   change in color of mucus.  No-  wheezing.   Skin: No-   rash or lesions. GI:  No-   heartburn, indigestion, abdominal pain, nausea, vomiting, diarrhea,                 change in bowel habits, loss of appetite GU:  MS:  No-   joint pain or swelling.  No- decreased range of motion.  No- back pain. Neuro-     nothing unusual Psych:  No- change in mood or affect. No depression or anxiety.  No memory loss.      Objective:   Physical Exam General- Alert, Oriented, Affect-appropriate, Distress- none acute. She looks to her daughter for answers to my questions suggesting some difficulty in hearing or mild dementia Skin- rash-none, lesions- none, excoriation- none Lymphadenopathy- none Head- atraumatic            Eyes- Gross vision intact, PERRLA, conjunctivae clear secretions            Ears- Hearing, canals-normal            Nose- Clear, no-Septal dev, mucus, polyps, erosion, perforation             Throat-  Mallampati II , mucosa clear , drainage- none, tonsils- atrophic Neck- flexible , trachea midline, no stridor , thyroid nl, carotid no bruit Chest - symmetrical excursion , unlabored           Heart/CV- RRR today , no murmur , no gallop  , no rub, nl s1 s2                           - JVD- none , edema- none, stasis changes- none, varices- none           Lung- c raspy bilateral rhonchi with her cough but otherwise sounds clear to quiet breathing, unlabored. , dullness-none, rub- none           Chest wall-  Abd- tender-no, distended-no, bowel sounds-present, HSM- no Br/ Gen/ Rectal- Not done, not indicated Extrem- cyanosis- none, clubbing, none, atrophy- none, strength- nl Neuro- grossly intact to observation

## 2011-11-04 NOTE — Patient Instructions (Addendum)
Sample and script for Spiriva- inhale one daily

## 2011-11-06 NOTE — Assessment & Plan Note (Signed)
Chronic bronchitis by exam but with  residual effects from her lung cancer. Because she is being followed by her oncologist, I will not order extra chest x-ray today. Plan-try Spiriva as discussed.

## 2011-11-06 NOTE — Assessment & Plan Note (Signed)
Good compliance and control 

## 2011-11-06 NOTE — Assessment & Plan Note (Signed)
[facility] We has not been contacted by them, for any special needs.

## 2011-12-02 ENCOUNTER — Telehealth: Payer: Self-pay | Admitting: Internal Medicine

## 2011-12-02 MED ORDER — BUDESONIDE-FORMOTEROL FUMARATE 160-4.5 MCG/ACT IN AERO: 2.0000 | INHALATION_SPRAY | Freq: Two times a day (BID) | RESPIRATORY_TRACT | Status: DC

## 2011-12-02 NOTE — Telephone Encounter (Signed)
Daughter is aware that she will need the original Rx for the patient assistance program; she requests we send via mail to her moms address on file. This has been signed by [facility] and mailed as requested.

## 2011-12-11 ENCOUNTER — Telehealth: Payer: Self-pay | Admitting: Internal Medicine

## 2011-12-11 NOTE — Telephone Encounter (Signed)
I spoke with daughter and she states pt found the rx and advised me to disregard the message. Nothing further was needed

## 2011-12-25 ENCOUNTER — Other Ambulatory Visit: Payer: Self-pay | Admitting: Internal Medicine

## 2011-12-30 ENCOUNTER — Other Ambulatory Visit: Payer: Self-pay | Admitting: Internal Medicine

## 2011-12-31 ENCOUNTER — Encounter: Payer: Self-pay | Admitting: Internal Medicine

## 2011-12-31 ENCOUNTER — Ambulatory Visit (INDEPENDENT_AMBULATORY_CARE_PROVIDER_SITE_OTHER): Payer: Medicare Other | Admitting: Internal Medicine

## 2011-12-31 ENCOUNTER — Other Ambulatory Visit (INDEPENDENT_AMBULATORY_CARE_PROVIDER_SITE_OTHER): Payer: Medicare Other

## 2011-12-31 DIAGNOSIS — I10 Essential (primary) hypertension: Secondary | ICD-10-CM

## 2011-12-31 DIAGNOSIS — Z86010 Personal history of colonic polyps: Secondary | ICD-10-CM

## 2011-12-31 DIAGNOSIS — E785 Hyperlipidemia, unspecified: Secondary | ICD-10-CM

## 2011-12-31 DIAGNOSIS — E119 Type 2 diabetes mellitus without complications: Secondary | ICD-10-CM

## 2011-12-31 DIAGNOSIS — M810 Age-related osteoporosis without current pathological fracture: Secondary | ICD-10-CM | POA: Insufficient documentation

## 2011-12-31 LAB — COMPREHENSIVE METABOLIC PANEL
AST: 30 U/L (ref 0–37)
Alkaline Phosphatase: 57 U/L (ref 39–117)
Glucose, Bld: 97 mg/dL (ref 70–99)
Sodium: 142 mEq/L (ref 135–145)
Total Bilirubin: 0.7 mg/dL (ref 0.3–1.2)
Total Protein: 7 g/dL (ref 6.0–8.3)

## 2011-12-31 LAB — CBC WITH DIFFERENTIAL/PLATELET
Eosinophils Relative: 0.9 % (ref 0.0–5.0)
HCT: 43 % (ref 36.0–46.0)
Hemoglobin: 14.2 g/dL (ref 12.0–15.0)
Lymphocytes Relative: 27.6 % (ref 12.0–46.0)
Lymphs Abs: 2.2 10*3/uL (ref 0.7–4.0)
Monocytes Relative: 6.2 % (ref 3.0–12.0)
Platelets: 200 10*3/uL (ref 150.0–400.0)
WBC: 7.9 10*3/uL (ref 4.5–10.5)

## 2011-12-31 LAB — URINALYSIS, ROUTINE W REFLEX MICROSCOPIC
Ketones, ur: NEGATIVE
Nitrite: NEGATIVE
Specific Gravity, Urine: >=1.03 (ref 1.000–1.030)
Total Protein, Urine: 100
pH: 5.5 (ref 5.0–8.0)

## 2011-12-31 LAB — LIPID PANEL
HDL: 62.9 mg/dL (ref >39.00)
Total CHOL/HDL Ratio: 2

## 2011-12-31 LAB — HEMOGLOBIN A1C: Hgb A1c MFr Bld: 5.9 % (ref 4.6–6.5)

## 2011-12-31 LAB — TSH: TSH: 1.47 u[IU]/mL (ref 0.35–5.50)

## 2011-12-31 MED ORDER — RISEDRONATE SODIUM 35 MG PO TBEC: 1.0000 | DELAYED_RELEASE_TABLET | ORAL | Status: DC

## 2011-12-31 MED ORDER — METFORMIN HCL 500 MG PO TABS: 500.0000 mg | ORAL_TABLET | Freq: Two times a day (BID) | ORAL | Status: DC

## 2011-12-31 NOTE — Assessment & Plan Note (Signed)
Start atelvia, check Vit D level today, refer for DEXA, pt ed material today

## 2011-12-31 NOTE — Progress Notes (Signed)
Subjective:    Patient ID: Veronica Duarte, female    DOB: 1946-05-16, 66 y.o.   MRN: [id number]  Diabetes She presents for her follow-up diabetic visit. She has type 2 diabetes mellitus. Her disease course has been stable. There are no hypoglycemic associated symptoms. Pertinent negatives for diabetes include no blurred vision, no chest pain, no fatigue, no foot paresthesias, no foot ulcerations, no polydipsia, no polyphagia, no polyuria, no visual change, no weakness and no weight loss. There are no hypoglycemic complications. Symptoms are stable. There are no diabetic complications. Current diabetic treatment includes oral agent (monotherapy). She is compliant with treatment all of the time. Her weight is stable. She is following a generally healthy diet. Meal planning includes avoidance of concentrated sweets. She has not had a previous visit with a dietician. She never participates in exercise. There is no change in her home blood glucose trend. An ACE inhibitor/angiotensin II receptor blocker is being taken. She does not see a podiatrist.Eye exam is current.  Hyperlipidemia This is a chronic problem. The current episode started more than 1 year ago. The problem is controlled. Recent lipid tests were reviewed and are variable. Exacerbating diseases include diabetes and obesity. She has no history of chronic renal disease, hypothyroidism, liver disease or nephrotic syndrome. Factors aggravating her hyperlipidemia include no known factors. Pertinent negatives include no chest pain, focal sensory loss, focal weakness, leg pain, myalgias or shortness of breath. Current antihyperlipidemic treatment includes statins, ezetimibe and nicotinic acid. The current treatment provides significant improvement of lipids. Compliance problems include adherence to exercise and adherence to diet.       Review of Systems  Constitutional: Negative for fever, chills, weight loss, diaphoresis, activity change, appetite  change, fatigue and unexpected weight change.  HENT: Negative.   Eyes: Negative.  Negative for blurred vision.  Respiratory: Negative for cough, choking, chest tightness, shortness of breath, wheezing and stridor.   Cardiovascular: Negative for chest pain, palpitations and leg swelling.  Gastrointestinal: Negative.   Genitourinary: Negative.  Negative for polyuria.  Musculoskeletal: Negative.  Negative for myalgias.  Skin: Negative.   Neurological: Negative.  Negative for focal weakness and weakness.  Hematological: Negative.  Negative for polydipsia and polyphagia.  Psychiatric/Behavioral: Negative.        Objective:   Physical Exam  Vitals reviewed. Constitutional: She is oriented to person, place, and time. She appears well-developed and well-nourished. No distress.  HENT:  Head: Normocephalic and atraumatic.  Mouth/Throat: Oropharynx is clear and moist. No oropharyngeal exudate.  Eyes: Conjunctivae are normal. Right eye exhibits no discharge. Left eye exhibits no discharge. No scleral icterus.  Neck: Normal range of motion. Neck supple. No JVD present. No tracheal deviation present. No thyromegaly present.  Cardiovascular: Normal rate, regular rhythm, normal heart sounds and intact distal pulses.  Exam reveals no gallop and no friction rub.   No murmur heard. Pulmonary/Chest: Effort normal and breath sounds normal. No stridor. No respiratory distress. She has no wheezes. She has no rales. She exhibits no tenderness.  Abdominal: Soft. Bowel sounds are normal. She exhibits no distension and no mass. There is no tenderness. There is no rebound and no guarding.  Musculoskeletal: Normal range of motion. She exhibits no edema and no tenderness.       Thoracic back: She exhibits deformity (kyphosis). She exhibits normal range of motion, no tenderness, no bony tenderness, no swelling, no edema, no laceration, no pain, no spasm and normal pulse.  Lymphadenopathy:    She has no cervical  adenopathy.  Neurological: She is oriented to person, place, and time.  Skin: Skin is warm and dry. No rash noted. She is not diaphoretic. No erythema. No pallor.  Psychiatric: She has a normal mood and affect. Her behavior is normal. Judgment and thought content normal.      Lab Results  Component Value Date   WBC 6.2 09/06/2011   HGB 14.6 09/06/2011   HCT 42.9 09/06/2011   PLT 195 09/06/2011   GLUCOSE 181* 09/06/2011   ALT 18 03/14/2011   AST 24 09/06/2011   NA 145 09/06/2011   K 3.9 09/06/2011   CL 100 09/06/2011   CREATININE 0.5* 09/06/2011   BUN 12 09/06/2011   CO2 26 09/06/2011   TSH 1.61 01/02/2011   INR 1.10 02/13/2011   HGBA1C 5.8 01/02/2011      Assessment & Plan:

## 2011-12-31 NOTE — Assessment & Plan Note (Signed)
BP is well controlled, I will check her lytes and renal function today 

## 2011-12-31 NOTE — Patient Instructions (Signed)
Osteoporosis Osteoporosis is a disease of the bones that makes them weaker and prone to break (fracture). By their mid-30s, most people begin to gradually lose bone strength. If this is severe enough, osteoporosis may occur. Osteopenia is a less severe weakness of the bones, which places you at risk for osteoporosis. It is important to identify if you have osteoporosis or osteopenia. Bone fractures from osteoporosis (especially hip and spine fractures) are a major cause of hospitalization, loss of independence, and can lead to life-threatening complications. CAUSES  There are a number of causes and risk factors:  Gender. Women are at a higher risk for osteoporosis than men.   Age. Bone formation slows down with age.   Ethnicity. For unclear reasons, white and Asian women are at higher risk for osteoporosis. Hispanic and African American women are at increased, but lesser, risk.   Family history of osteoporosis can mean that you are at a higher risk for getting it.   History of bone fractures indicates you may be at higher risk of another.   Calcium is very important for bone health and strength. Not enough calcium in your diet increases your risk for osteoporosis. Vitamin D is important for calcium metabolism. You get vitamin D from sunlight, foods, or supplements.   Physical activity. Bones get stronger with weight-bearing exercise and weaker without use.   Smoking is associated with decreased bone strength.   Medicines. Cortisone medicines, too much thyroid medicine, some cancer and seizure medicines, and others can weaken bones and cause osteoporosis.   Decreased body weight is associated with osteoporosis. The small amount of estrogen-type molecules produced in fat cells seems to protect the bones.   Menopausal decrease in the hormone estrogen can cause osteoporosis.   Low levels of the hormone testosterone can cause osteoporosis.   Some medical conditions can lead to osteoporosis  (hyperthyroidism, hyperparathyroidism, B12 deficiency).  SYMPTOMS  Usually, no symptoms are felt as the bones weaken. The first symptoms are generally related to bone fractures. You may have silent, tiny bone fractures, especially in your spine. This can cause height loss and forward bending of the spine (kyphosis). DIAGNOSIS  You or your caregiver may suspect osteoporosis based on height loss and kyphosis. Osteoporosis or osteopenia may be identified on an X-ray done for other reasons. A bone density measurement will likely be taken. Your bones are often measured at your lower spine or your hips. Measurement is done by an X-ray called a DEXA scan, or sometimes by a computerized X-ray scan (CT or CAT scan). Other tests may be done to find the cause of osteoporosis, such as blood tests to measure calcium and vitamin D, or to monitor treatment. TREATMENT  The goal of osteoporosis treatment is to prevent fractures. This is done through medicine and home care treatments. Treatment will slow the weakening of your bones and strengthen them where possible. Measures to decrease the likelihood of falling and fracturing a bone are also important. Medicine  You may need supplements if you are not getting enough calcium, vitamin D, and vitamin B12.   If you are female and menopausal, you should discuss the option of estrogen replacement or estrogen-like medicine with your caregiver.   Medicines can be taken by mouth or injection to help build bone strength. When taken by mouth, there are important directions that you need to follow.   Calcitonin is a hormone made by the thyroid gland that can help build bone strength and decrease fracture risk in the spine. It   can be taken by nasal spray or injection.   Parathyroid hormone can be injected to help build bone strength.   You will need to continue to get enough calcium intake with any of these medicines.  FALL PREVENTION  If you are unsteady on your feet, use  a cane, walker, or walk with someone's help.   Remove loose rugs or electrical cords from your home.   Keep your home well lit at night. Use glasses if you need them.   Avoid icy streets and wet or waxed floors.   Hold the railing when using stairs.   Watch out for your pets.   Install grab bars in your bathroom.   Exercise. Physical activity, especially weight-bearing exercise, helps strengthen bones. Strength and balance exercise, such as tai chi, helps prevent falls.   Alcohol and some medicines can make you more likely to fall. Discuss alcohol use with your caregiver. Ask your caregiver if any of your medicines might increase your risk for falling. Ask if safer alternatives are available.  HOME CARE INSTRUCTIONS   Try to prevent and avoid falls.   To pick up objects, bend at the knees. Do not bend with your back.   Do not smoke. If you smoke, ask for help to stop.   Have adequate calcium and vitamin D in your diet. Talk with your caregiver about amounts.   Before exercising, ask your caregiver what exercises will be good for you.   Only take over-the-counter or prescription medicines for pain, discomfort, or fever as directed by your caregiver.  SEEK MEDICAL CARE IF:   You have had a fracture and your pain is not controlled.   You have had a fracture and you are not able to return to activities as expected.   You are reinjured.   You develop side effects from medicines, especially stomach pain or trouble swallowing.   You develop new, unexplained problems.  SEEK IMMEDIATE MEDICAL CARE IF:   You develop sudden, severe pain in your back.   You develop pain after an injury or fall.  Document Released: 08/14/2005 Document Revised: 07/17/2011 Document Reviewed: 07/02/2007 [facility] Patient Information 2012 [place], [place].Diabetes, Type 2 Diabetes is a long-lasting (chronic) disease. In type 2 diabetes, the pancreas does not make enough insulin (a hormone), and the body  does not respond normally to the insulin that is made. This type of diabetes was also previously called adult-onset diabetes. It usually occurs after the age of 37, but it can occur at any age.  CAUSES  Type 2 diabetes happens because the pancreasis not making enough insulin or your body has trouble using the insulin that your pancreas does make properly. SYMPTOMS   Drinking more than usual.   Urinating more than usual.   Blurred vision.   Dry, itchy skin.   Frequent infections.   Feeling more tired than usual (fatigue).  DIAGNOSIS The diagnosis of type 2 diabetes is usually made by one of the following tests:  Fasting blood glucose test. You will not eat for at least 8 hours and then take a blood test.   Random blood glucose test. Your blood glucose (sugar) is checked at any time of the day regardless of when you ate.   Oral glucose tolerance test (OGTT). Your blood glucose is measured after you have not eaten (fasted) and then after you drink a glucose containing beverage.  TREATMENT   Healthy eating.   Exercise.   Medicine, if needed.   Monitoring blood glucose.  Seeing your caregiver regularly.  HOME CARE INSTRUCTIONS   Check your blood glucose at least once a day. More frequent monitoring may be necessary, depending on your medicines and on how well your diabetes is controlled. Your caregiver will advise you.   Take your medicine as directed by your caregiver.   Do not smoke.   Make wise food choices. Ask your caregiver for information. Weight loss can improve your diabetes.   Learn about low blood glucose (hypoglycemia) and how to treat it.   Get your eyes checked regularly.   Have a yearly physical exam. Have your blood pressure checked and your blood and urine tested.   Wear a pendant or bracelet saying that you have diabetes.   Check your feet every night for cuts, sores, blisters, and redness. Let your caregiver know if you have any problems.  SEEK  MEDICAL CARE IF:   You have problems keeping your blood glucose in target range.   You have problems with your medicines.   You have symptoms of an illness that do not improve after 24 hours.   You have a sore or wound that is not healing.   You notice a change in vision or a new problem with your vision.   You have a fever.  MAKE SURE YOU:  Understand these instructions.   Will watch your condition.   Will get help right away if you are not doing well or get worse.  Document Released: 11/04/2005 Document Revised: 07/18/2011 Document Reviewed: 04/22/2011 [facility] Patient Information 2012 [place], [place].

## 2011-12-31 NOTE — Assessment & Plan Note (Signed)
GI referral

## 2011-12-31 NOTE — Assessment & Plan Note (Signed)
For an FLP today 

## 2011-12-31 NOTE — Assessment & Plan Note (Signed)
Check a1c today and monitor her renal function

## 2012-01-01 ENCOUNTER — Ambulatory Visit: Payer: Medicare Other | Admitting: Internal Medicine

## 2012-01-02 ENCOUNTER — Encounter: Payer: Self-pay | Admitting: Internal Medicine

## 2012-01-02 LAB — VITAMIN D 1,25 DIHYDROXY
Vitamin D 1, 25 (OH)2 Total: 42 pg/mL (ref 18–72)
Vitamin D2 1, 25 (OH)2: <8 pg/mL

## 2012-01-29 ENCOUNTER — Encounter: Payer: Self-pay | Admitting: Gastroenterology

## 2012-02-19 ENCOUNTER — Ambulatory Visit (INDEPENDENT_AMBULATORY_CARE_PROVIDER_SITE_OTHER)
Admission: RE | Admit: 2012-02-19 | Discharge: 2012-02-19 | Disposition: A | Discharge status: Home / Self Care | Payer: Medicare Other | Source: Ambulatory Visit | Attending: Internal Medicine | Admitting: Internal Medicine

## 2012-02-19 ENCOUNTER — Ambulatory Visit (INDEPENDENT_AMBULATORY_CARE_PROVIDER_SITE_OTHER): Payer: Medicare Other | Admitting: Internal Medicine

## 2012-02-19 ENCOUNTER — Encounter: Payer: Self-pay | Admitting: Internal Medicine

## 2012-02-19 VITALS — BP 124/68 | HR 73 | Ht 65.0 in | Wt 194.0 lb

## 2012-02-19 DIAGNOSIS — C3410 Malignant neoplasm of upper lobe, unspecified bronchus or lung: Secondary | ICD-10-CM

## 2012-02-19 DIAGNOSIS — J449 Chronic obstructive pulmonary disease, unspecified: Secondary | ICD-10-CM

## 2012-02-19 NOTE — Patient Instructions (Signed)
Sample Tudorza inhaler- 1 puff twice daily- watch for effect on shortness of breath with exertion over time  Order- CXR- dx COPD  Order- 6 MWT  Dx COPD

## 2012-02-19 NOTE — Progress Notes (Signed)
Subjective:    Patient ID: Veronica Duarte, female    DOB: June 08, 1946, 66 y.o.   MRN: [id number]  HPI 05/03/11- 30 yoF former smoker with COPD, hx Adnoca RUL, OSA.  Here w/ daughter Last here February 04, 2011. Had RUL wedge resection of her lung cancer by Dr Laneta Simmers. Has seen Dr Shirline Frees f/u with no adjuvent needed. She will have a CT in 6 months f/u. Newly found PAFib- Dr Tresa Endo is putting her on Plavix and Diltiazem.  Continues BiPAP 15/10 SMS- "can't sleep without it". Breathing is doing well, with little cough or wheeze. Rare need for rescue inhaler. Used O2 after surgery but no longer needed. Continues Symbicort , usually once daily.   11/04/11-  56 yoF former smoker with COPD, hx Adnoca RUL, OSA. complicated by AFib  Here w/ daughter Has had flu vaccine. She got a good report on her six-month cancer followup CT scan, from Dr. Shirline Frees. She now reports her breathing has been worse for 3 weeks. She had had a cold 3 or 4 days around Thanksgiving. She has The raspy rattle with cough associated with her chronic bronchitis. She never filled prescription for Spiriva because of cost. She denies purulent or bloody sputum, chest pain, fever or nodes. Sleep apnea is well managed with BiPAP per Dr. Tresa Endo. Her paroxysmal atrial fibrillation has not recurred since she has been on diltiazem and Plavix.  02/19/12- 53 yoF former smoker with COPD, hx Adenoca RUL, OSA/ BIPAP/Dr Tresa Endo. complicated by AFib  Here w/ daughter [facility] continues following and she has a pending CT scan. Had MRI by neurology for her OBS with results pending. COPD varies with weather. Decreased endurance. Desaturates with exertion to 89 or 90%, using 4 L of oxygen at rest. 4 frequent use of metered inhaler, averaging once per day. More short of breath on status and heels. More cough and wheeze but nonproductive. Pain right scapula is different from her postthoracotomy pain. Otherwise not well described but persistent. Tried Spiriva  sample-no effect.  ROS-see HPI Constitutional:   No-   weight loss, night sweats, fevers, chills, fatigue, lassitude. HEENT:   No-  headaches, difficulty swallowing, tooth/dental problems, sore throat,       No-  sneezing, itching, ear ache, nasal congestion, post nasal drip,  CV:  No-   chest pain, orthopnea, PND, swelling in lower extremities, anasarca, dizziness, palpitations Resp: +  shortness of breath with exertion or at rest.              No-   productive cough,  No non-productive cough,  No- coughing up of blood.              No-   change in color of mucus.  No- wheezing.   Skin: No-   rash or lesions. GI:  No-   heartburn, indigestion, abdominal pain, nausea, vomiting, diarrhea,                 change in bowel habits, loss of appetite GU: No-   dysuria, change in color of urine, no urgency or frequency.  No- flank pain. MS:  No-   joint pain or swelling.    + back pain. Neuro-     nothing unusual Psych:  No- change in mood or affect. No depression or anxiety.  No memory loss.  OBJ- Physical Exam General- Alert, Oriented, Affect-appropriate, Distress- none acute. Obese, passive Skin- rash-none, lesions- none, excoriation- none Lymphadenopathy- none Head- atraumatic  Eyes- Gross vision intact, PERRLA, conjunctivae and secretions clear            Ears- Hearing, canals-normal            Nose- Clear, no-Septal dev, mucus, polyps, erosion, perforation             Throat- Mallampati II , mucosa clear , drainage- none, tonsils- atrophic Neck- flexible , trachea midline, no stridor , thyroid nl, carotid no bruit Chest - symmetrical excursion , unlabored           Heart/CV- RRR , no murmur , no gallop  , no rub, nl s1 s2                           - JVD- none , edema- none, stasis changes- none, varices- none           Lung-  Decreased,clear to P&A, wheeze- none, cough- none , dullness-none, rub- none           Chest wall-  Abd-  Br/ Gen/ Rectal- Not done, not  indicated Extrem- cyanosis- none, clubbing, none, atrophy- none, strength- nl Neuro- grossly intact to observation

## 2012-02-21 ENCOUNTER — Other Ambulatory Visit: Payer: Self-pay | Admitting: Internal Medicine

## 2012-02-21 ENCOUNTER — Telehealth: Payer: Self-pay | Admitting: *Deleted

## 2012-02-21 ENCOUNTER — Telehealth: Payer: Self-pay | Admitting: Internal Medicine

## 2012-02-21 ENCOUNTER — Other Ambulatory Visit (INDEPENDENT_AMBULATORY_CARE_PROVIDER_SITE_OTHER): Payer: Medicare Other

## 2012-02-21 DIAGNOSIS — R911 Solitary pulmonary nodule: Secondary | ICD-10-CM

## 2012-02-21 LAB — BASIC METABOLIC PANEL
BUN: 10 mg/dL (ref 6–23)
Calcium: 9.7 mg/dL (ref 8.4–10.5)
Creatinine, Ser: 0.7 mg/dL (ref 0.4–1.2)
GFR: 84.29 mL/min (ref >60.00)
Glucose, Bld: 98 mg/dL (ref 70–99)

## 2012-02-21 NOTE — Telephone Encounter (Signed)
cx lab and ct per sj as pt having ct on 4/9

## 2012-02-21 NOTE — Telephone Encounter (Signed)
Pt's daughter [name] called stating that Dr Maple Hudson has found a new nodule in pt's lung and he is going to do a CT chest to asses further.  She already has CT scheduled with [facility] on 4/25.  Will cancel CT with Dr Donnald Garre.  Informed Dr Donnald Garre that pt was getting CT sooner.  Onc tx schedule filled out to cx lab and CT on 4/25.  SLJ

## 2012-02-21 NOTE — Progress Notes (Signed)
Quick Note:  Pt aware of results; aware order placed for CT chest with contrast-pt will come by today 02-21-12 to have STAT BMET drawn. ______

## 2012-02-25 ENCOUNTER — Ambulatory Visit (INDEPENDENT_AMBULATORY_CARE_PROVIDER_SITE_OTHER)
Admission: RE | Admit: 2012-02-25 | Discharge: 2012-02-25 | Disposition: A | Discharge status: Home / Self Care | Payer: Medicare Other | Source: Ambulatory Visit | Attending: Internal Medicine | Admitting: Internal Medicine

## 2012-02-25 DIAGNOSIS — R911 Solitary pulmonary nodule: Secondary | ICD-10-CM

## 2012-02-25 MED ORDER — IOHEXOL 300 MG/ML  SOLN
80.0000 mL | Freq: Once | INTRAMUSCULAR | Status: AC | PRN
  Administered 2012-02-25: 80 mL via INTRAVENOUS

## 2012-02-25 NOTE — Progress Notes (Signed)
Quick Note:  LMTCB ______ 

## 2012-02-26 ENCOUNTER — Other Ambulatory Visit: Payer: Self-pay | Admitting: Internal Medicine

## 2012-02-26 DIAGNOSIS — R911 Solitary pulmonary nodule: Secondary | ICD-10-CM

## 2012-02-26 DIAGNOSIS — C3490 Malignant neoplasm of unspecified part of unspecified bronchus or lung: Secondary | ICD-10-CM

## 2012-02-26 NOTE — Progress Notes (Signed)
Quick Note:  Pt aware of results and requests we speak with daughter Abbe Amsterdam (daughter) Works @ [employer] to set up appt time and date, order placed. ______

## 2012-02-28 ENCOUNTER — Encounter: Payer: Self-pay | Admitting: Internal Medicine

## 2012-02-28 NOTE — Assessment & Plan Note (Signed)
Pending followup CT by [facility] at

## 2012-02-28 NOTE — Assessment & Plan Note (Signed)
Describes decreased exercise tolerance without much specific. It's hard to look at her and imagine that she is getting useful exercise for stamina. Plan-chest x-ray, 6 minute walk test,sample [facility] American

## 2012-03-06 ENCOUNTER — Encounter (HOSPITAL_COMMUNITY)
Admission: RE | Admit: 2012-03-06 | Discharge: 2012-03-06 | Disposition: A | Discharge status: Home / Self Care | Payer: Medicare Other | Source: Ambulatory Visit | Attending: Internal Medicine | Admitting: Internal Medicine

## 2012-03-06 ENCOUNTER — Encounter (HOSPITAL_COMMUNITY): Payer: Self-pay

## 2012-03-06 DIAGNOSIS — C771 Secondary and unspecified malignant neoplasm of intrathoracic lymph nodes: Secondary | ICD-10-CM | POA: Insufficient documentation

## 2012-03-06 DIAGNOSIS — R911 Solitary pulmonary nodule: Secondary | ICD-10-CM

## 2012-03-06 DIAGNOSIS — R599 Enlarged lymph nodes, unspecified: Secondary | ICD-10-CM | POA: Insufficient documentation

## 2012-03-06 DIAGNOSIS — C3490 Malignant neoplasm of unspecified part of unspecified bronchus or lung: Secondary | ICD-10-CM

## 2012-03-06 DIAGNOSIS — R918 Other nonspecific abnormal finding of lung field: Secondary | ICD-10-CM | POA: Insufficient documentation

## 2012-03-06 LAB — GLUCOSE, CAPILLARY: Glucose-Capillary: 115 mg/dL — ABNORMAL HIGH (ref 70–99)

## 2012-03-06 MED ORDER — FLUDEOXYGLUCOSE F - 18 (FDG) INJECTION
17.7000 | Freq: Once | INTRAVENOUS | Status: AC | PRN
  Administered 2012-03-06: 17.7 via INTRAVENOUS

## 2012-03-12 ENCOUNTER — Telehealth: Payer: Self-pay | Admitting: Internal Medicine

## 2012-03-12 ENCOUNTER — Other Ambulatory Visit (HOSPITAL_COMMUNITY): Payer: Medicare Other

## 2012-03-12 ENCOUNTER — Other Ambulatory Visit: Payer: Medicare Other | Admitting: Lab

## 2012-03-12 NOTE — Telephone Encounter (Signed)
I called patient's home and left contact effort on that machine. Then called daughter Abbe Amsterdam at work #  [phone] and reported abnormal PET consistent with metastatic cancer in chest. Patient has appointment with Dr Shirline Frees next week. I have forwarded the PET report to him.  He and patient will decide whether to cancel future appointments with me.

## 2012-03-16 ENCOUNTER — Ambulatory Visit (HOSPITAL_BASED_OUTPATIENT_CLINIC_OR_DEPARTMENT_OTHER): Payer: Medicare Other | Admitting: Internal Medicine

## 2012-03-16 ENCOUNTER — Telehealth: Payer: Self-pay | Admitting: Internal Medicine

## 2012-03-16 VITALS — BP 138/70 | HR 64 | Temp 97.1°F | Ht 65.0 in | Wt 204.4 lb

## 2012-03-16 DIAGNOSIS — C3410 Malignant neoplasm of upper lobe, unspecified bronchus or lung: Secondary | ICD-10-CM

## 2012-03-16 NOTE — Progress Notes (Signed)
[facility] [phone]   [phone]  OFFICE PROGRESS NOTE  [name], MD, MD [address] [facility] [address] 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  PRIOR THERAPY:  Status post wedge resection of the right upper lobe under the care of Dr. Edwyna Shell on February 14, 2011.  CURRENT THERAPY:  Observation.  INTERVAL HISTORY: Veronica Duarte 66 y.o. female returns to the clinic today for followup visit accompanied by her daughter. The patient recently start complaining of pain on the right rib cage with cough and shortness of breath. She was seen by Dr. Maple Hudson and CT scan of the chest followed by PET scan were performed recently and showed evidence for metastatic disease in the right lung with questionable liver lesion. She had MRI of the brain performed by her neurologist in March of 2013 and this was negative for metastatic disease. The patient is here today for evaluation and discussion of her treatment options. She is feeling fine today with no significant chest pain or shortness of breath. He has no significant weight loss.  MEDICAL HISTORY: Past Medical History  Diagnosis Date  . Hypertension   . Diabetes mellitus     type 2- metformin  . ADENOCARCINOMA, RIGHT LUNG, UPPER LOBE 02/04/2011  . Depression   . RAS (renal artery stenosis)   . OSA (obstructive sleep apnea)   . AAA (abdominal aortic aneurysm)   . Osteoarthritis   . COPD (chronic obstructive pulmonary disease)   . Hx of colonic polyps   . CAD (coronary artery disease)     ALLERGIES:   has no known allergies.  MEDICATIONS:  Current Outpatient Prescriptions  Medication Sig Dispense Refill  . Aclidinium Bromide (TUDORZA PRESSAIR IN) Inhale 1 puff into the lungs 2 (two) times daily.      Marland Kitchen albuterol (PROAIR  HFA) 108 (90 BASE) MCG/ACT inhaler Inhale 2 puffs into the lungs every 6 (six) hours as needed.  1 Inhaler  3  . aspirin 81 MG tablet Take 81 mg by mouth daily.        . budesonide-formoterol (SYMBICORT) 160-4.5 MCG/ACT inhaler Inhale 2 puffs into the lungs 2 (two) times daily.  3 Inhaler  3  . clopidogrel (PLAVIX) 75 MG tablet Take 75 mg by mouth daily.        . CYMBALTA 60 MG capsule TAKE ONE CAPSULE BY MOUTH DAILY  30 capsule  11  . diltiazem (DILACOR XR) 120 MG 24 hr capsule Take 120 mg by mouth at bedtime.        Marland Kitchen ezetimibe (ZETIA) 10 MG tablet Take 10 mg by mouth daily.        . ferrous sulfate 325 (65 FE) MG tablet Take 325 mg by mouth daily with breakfast.        . guaiFENesin (MUCINEX) 600 MG 12 hr tablet Take 600 mg by mouth as needed.       Marland Kitchen losartan (COZAAR) 50 MG tablet Take 1 tablet by mouth daily.      . metFORMIN (GLUCOPHAGE) 500 MG tablet Take 1 tablet (500 mg total) by mouth 2 (two) times daily with a meal.  60 tablet  5  . Multiple Vitamin (MULTIVITAMIN) tablet Take 1 tablet by mouth daily.        . Nebivolol HCl (BYSTOLIC) 20  MG TABS Take 1 tablet by mouth daily.        . niacin (NIASPAN) 1000 MG CR tablet Take 1,000 mg by mouth at bedtime.        . Risedronate Sodium (ATELVIA) 35 MG TBEC Take 1 tablet (35 mg total) by mouth once a week.  4 tablet  0  . rosuvastatin (CRESTOR) 20 MG tablet Take 20 mg by mouth daily.        . vitamin C (ASCORBIC ACID) 500 MG tablet Take 500 mg by mouth 2 (two) times daily.        . cetirizine (ZYRTEC) 10 MG tablet Take 10 mg by mouth daily.      Marland Kitchen tiotropium (SPIRIVA) 18 MCG inhalation capsule Place 1 capsule (18 mcg total) into inhaler and inhale daily.  30 capsule  prn    SURGICAL HISTORY:  Past Surgical History  Procedure Date  . 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 Vesicovaginal fistula closure w/ tah   . Abdominal hysterectomy   . Right video-assisted thoracoscopy with wedge  resection of 02/14/11    Dr Evelene Croon  . Cardiac catheterization 08/30/2009    Dr Aleen Campi  . Intraoperative transesophageal echocardiography. 08/30/2009    Dr Kipp Brood    REVIEW OF SYSTEMS:  A comprehensive review of systems was negative.   PHYSICAL EXAMINATION: General appearance: alert, cooperative and no distress Head: Normocephalic, without obvious abnormality, atraumatic Neck: no adenopathy Lymph nodes: Cervical, supraclavicular, and axillary nodes normal. Resp: clear to auscultation bilaterally Cardio: regular rate and rhythm, S1, S2 normal, no murmur, click, rub or gallop GI: soft, non-tender; bowel sounds normal; no masses,  no organomegaly Extremities: extremities normal, atraumatic, no cyanosis or edema Neurologic: Alert and oriented X 3, normal strength and tone. Normal symmetric reflexes. Normal coordination and gait  ECOG PERFORMANCE STATUS: 1 - Symptomatic but completely ambulatory  Blood pressure 138/70, pulse 64, temperature 97.1 F (36.2 C), temperature source Oral, height 5\' 5"  (1.651 m), weight 204 lb 6.4 oz (92.715 kg).  LABORATORY DATA: Lab Results  Component Value Date   WBC 7.9 12/31/2011   HGB 14.2 12/31/2011   HCT 43.0 12/31/2011   MCV 86.0 12/31/2011   PLT 200.0 12/31/2011      Chemistry      Component Value Date/Time   NA 140 02/21/2012 1316   NA 145 09/06/2011 0902   K 4.0 02/21/2012 1316   K 3.9 09/06/2011 0902   CL 104 02/21/2012 1316   CL 100 09/06/2011 0902   CO2 26 02/21/2012 1316   CO2 26 09/06/2011 0902   BUN 10 02/21/2012 1316   BUN 12 09/06/2011 0902   CREATININE 0.7 02/21/2012 1316   CREATININE 0.5* 09/06/2011 0902      Component Value Date/Time   CALCIUM 9.7 02/21/2012 1316   CALCIUM 9.5 09/06/2011 0902   ALKPHOS 57 12/31/2011 0925   ALKPHOS 55 09/06/2011 0902   AST 30 12/31/2011 0925   AST 24 09/06/2011 0902   ALT 26 12/31/2011 0925   BILITOT 0.7 12/31/2011 0925   BILITOT 0.70 09/06/2011 0902       RADIOGRAPHIC  STUDIES: Dg Chest 2 View  02/19/2012  *RADIOLOGY REPORT*  Clinical Data: Worsening shortness of breath, history COPD, lung cancer, smoking, hypertension, diabetes, coronary artery disease  CHEST - 2 VIEW  Comparison: 07/15/2011  Findings: Normal heart size post CABG. Calcified tortuous aorta. Pulmonary vascularity normal. Emphysematous chronic bronchitic changes. Chronic  accentuation of bibasilar markings little changed. Questionable right mid lung nodule 11 x 8 mm. Staple line in mid right lung from prior resection. No infiltrate, pleural effusion, or pneumothorax. Multilevel endplate spur formation thoracic spine.  IMPRESSION: Emphysematous and bronchitic changes with evidence of prior right lung surgery. Question new 11 x 8 mm diameter right mid lung nodule, not definitely seen on prior exam. Recommend CT chest to evaluate.  Original Report Authenticated By: Lollie Marrow, M.D.   Ct Chest W Contrast  02/25/2012  *RADIOLOGY REPORT*  Clinical Data: Right lung nodule status post wedge resection of the right upper lobe  CT CHEST WITH CONTRAST  Technique:  Multidetector CT imaging of the chest was performed following the standard protocol during bolus administration of intravenous contrast.  Contrast: 80mL OMNIPAQUE IOHEXOL 300 MG/ML  SOLN  Comparison: Chest x-ray 02/19/2012 and CT of the chest 09/06/2011  Findings: Again noted post surgical changes post wedge resection of right upper lobe.  Bilateral emphysematous changes are stable.  Stable scarring in the right upper lobe anteriorly.  There is a nodule in the right upper lobe just anterior to the fissure measures 9.6 x 9.2 mm. This is best visualized in the axial image 25.  This has increased in size from prior exam.  There is a second nodule in the right upper lobe just anterior to the scar measures one by 0.6 cm.  This are suspicious for recurrent or metastatic disease.  Further evaluation with PET scan is recommended.  No acute infiltrate or pleural effusion.   No pulmonary edema.  There is no density probable cyst in the right hepatic dome anteriorly measures 1.2 cm stable in size and appearance from prior exam.  Sagittal images of the spine shows osteopenia and degenerative changes thoracic spine.  The patient is status post median sternotomy.  Atherosclerotic calcifications of the coronary arteries are noted.  There are atherosclerotic calcifications of the thoracic aorta.  Precarinal lymph node measures one by 0.7 cm without significant change.  No hilar adenopathy noted. Atherosclerotic calcifications of the splenic artery.  The visualized tail of the pancreas is unremarkable.  No adrenal gland mass is noted.  IMPRESSION:  1.  Bilateral emphysematous changes again noted.  Stable precarinal lymph node.  No hilar adenopathy. 2.There is a nodule in the right upper lobe just anterior to the fissure measures 9.6 x 9.2 mm. This is best visualized in the axial image 25.  This has increased in size from prior exam.  There is a second nodule in the right upper lobe just anterior to the scar measures one by 0.6 cm.  This are suspicious for recurrent or metastatic disease.  Further evaluation with PET scan is recommended. 3.  No acute infiltrate or pleural effusion.  No pulmonary edema.  Original Report Authenticated By: Natasha Mead, M.D.   Nm Pet Image Restag (ps) Skull Base To Thigh  03/06/2012  *RADIOLOGY REPORT*  Clinical Data: Subsequent treatment strategy for lung adenocarcinoma.  NUCLEAR MEDICINE PET SKULL BASE TO THIGH  Fasting Blood Glucose:  115  Technique:  17.7 mCi F-18 FDG was injected intravenously. CT data was obtained and used for attenuation correction and anatomic localization only.  (This was not acquired as a diagnostic CT examination.) Additional exam technical data entered on technologist worksheet.  Comparison:  01/21/2011  Findings:  Neck: No hypermetabolic lymph nodes in the neck.  Chest:  Postsurgical scarring noted in the right upper lobe.  A 7 mm  pulmonary nodule is seen  posteriorly along the major fissure on image 80 which has a maximum SUV of 6.1.  A second 10 mm nodule is seen inferior to this in the right midlung on image 86 which also shows hypermetabolic activity with maximum SUV of 1.7.  Both of these suspicious for pulmonary metastases.  Mild right hilar and right paratracheal and precarinal lymphadenopathy is now seen which is hypermetabolic, with maximum SUV measuring 28.2.  In addition, there is a new hypermetabolic lymph node in the right cardiophrenic angle measuring 1.3 cm, also consistent with metastatic disease.  Abdomen/Pelvis:  A focal area of hypermetabolic activity is seen in the posterior right hepatic lobe which is subcapsular in location on image 123, and was not seen on previous study.  Noncontrast CT images show a possible subtle low attenuation lesion at this site of the liver, and a liver metastasis cannot be excluded.  No abnormal hypermetabolic activity within the pancreas, adrenal glands, or spleen.  No hypermetabolic lymph nodes in the abdomen or pelvis. Incidentally noted on this exam are multiple tiny gallstones and a nonobstructing right renal calculus.  [place]:  No focal hypermetabolic activity to suggest skeletal metastasis.  IMPRESSION:  1.  Two hypermetabolic right lung nodules, suspicious for pulmonary metastases. 2.  Hypermetabolic metastatic lymphadenopathy in the right hilum, mediastinum, and right cardiophrenic angle. 3.  Question small hypermetabolic lesion in the posterior segment of the right hepatic lobe.  Liver metastasis cannot be excluded. Abdomen MRI without and with contrast is recommended for further evaluation.  Original Report Authenticated By: Danae Orleans, M.D.    ASSESSMENT: This is a very pleasant 66 years old white female with questionable metastatic non-small cell lung cancer.   PLAN: I have a lengthy discussion with the patient and her daughter today about her current disease stage,  prognosis and treatment options. I recommended for the patient: #1 I will refer the patient to interventional radiology for consideration of ultrasound guided fine needle aspiration and core biopsy of the liver lesion for tissue diagnosis. #2 the patient was advised to consult with her cardiologist to start the aspirin and Plavix for at least one week in preparation for her biopsy. Her daughter works as a Engineer, civil (consulting) at  the cardiology office.  #3 I would see her back for followup visit in 2 weeks for evaluation and discussion of her biopsy results as well as her treatment options. The patient was advised to call me immediately if she has any concerning symptoms in the interval. All questions were answered. The patient knows to call the clinic with any problems, questions or concerns. We can certainly see the patient much sooner if necessary.  I spent 20 minutes counseling the patient face to face. The total time spent in the appointment was 30 minutes.

## 2012-03-16 NOTE — Telephone Encounter (Signed)
gv pt appt schedule for may. S/w carrie re bx order. [facility] will contact pt re bx. Pt aware.

## 2012-03-18 ENCOUNTER — Encounter: Payer: Self-pay | Admitting: *Deleted

## 2012-03-18 NOTE — Progress Notes (Signed)
Recevied fax from Dr Nicki Guadalajara at [facility] that pt can hold ASA 5-7 days prior to procedure and resume after procedure; and patient can hold plavix 5-7 days prior to procedure and resume after procedure.  Form faxed to [facility] at radiology [phone] to schedule the biopsy.  Form scanned into pt's chart.  SLJ

## 2012-03-23 ENCOUNTER — Other Ambulatory Visit: Payer: Self-pay | Admitting: Radiology

## 2012-03-24 ENCOUNTER — Ambulatory Visit (HOSPITAL_COMMUNITY)
Admission: RE | Admit: 2012-03-24 | Discharge: 2012-03-24 | Disposition: A | Discharge status: Home / Self Care | Payer: Medicare Other | Source: Ambulatory Visit | Attending: Internal Medicine | Admitting: Internal Medicine

## 2012-03-24 ENCOUNTER — Encounter (HOSPITAL_COMMUNITY): Payer: Self-pay

## 2012-03-24 ENCOUNTER — Other Ambulatory Visit: Payer: Self-pay | Admitting: Internal Medicine

## 2012-03-24 ENCOUNTER — Ambulatory Visit: Payer: Medicare Other | Admitting: Gastroenterology

## 2012-03-24 DIAGNOSIS — C3490 Malignant neoplasm of unspecified part of unspecified bronchus or lung: Secondary | ICD-10-CM | POA: Insufficient documentation

## 2012-03-24 DIAGNOSIS — E119 Type 2 diabetes mellitus without complications: Secondary | ICD-10-CM | POA: Insufficient documentation

## 2012-03-24 DIAGNOSIS — C3410 Malignant neoplasm of upper lobe, unspecified bronchus or lung: Secondary | ICD-10-CM

## 2012-03-24 DIAGNOSIS — J4489 Other specified chronic obstructive pulmonary disease: Secondary | ICD-10-CM | POA: Insufficient documentation

## 2012-03-24 DIAGNOSIS — Z87891 Personal history of nicotine dependence: Secondary | ICD-10-CM | POA: Insufficient documentation

## 2012-03-24 DIAGNOSIS — I10 Essential (primary) hypertension: Secondary | ICD-10-CM | POA: Insufficient documentation

## 2012-03-24 DIAGNOSIS — J449 Chronic obstructive pulmonary disease, unspecified: Secondary | ICD-10-CM | POA: Insufficient documentation

## 2012-03-24 DIAGNOSIS — G4733 Obstructive sleep apnea (adult) (pediatric): Secondary | ICD-10-CM | POA: Insufficient documentation

## 2012-03-24 DIAGNOSIS — Z951 Presence of aortocoronary bypass graft: Secondary | ICD-10-CM | POA: Insufficient documentation

## 2012-03-24 DIAGNOSIS — K7689 Other specified diseases of liver: Secondary | ICD-10-CM | POA: Insufficient documentation

## 2012-03-24 DIAGNOSIS — I2510 Atherosclerotic heart disease of native coronary artery without angina pectoris: Secondary | ICD-10-CM | POA: Insufficient documentation

## 2012-03-24 LAB — PROTIME-INR: Prothrombin Time: 14.1 seconds (ref 11.6–15.2)

## 2012-03-24 LAB — CBC
MCH: 28.7 pg (ref 26.0–34.0)
Platelets: 209 10*3/uL (ref 150–400)
RBC: 4.95 MIL/uL (ref 3.87–5.11)

## 2012-03-24 MED ORDER — HYDROCODONE-ACETAMINOPHEN 5-325 MG PO TABS
1.0000 | ORAL_TABLET | ORAL | Status: DC | PRN
  Administered 2012-03-24 (×2): 1 via ORAL
  Filled 2012-03-24: qty 2

## 2012-03-24 MED ORDER — HYDROCODONE-ACETAMINOPHEN 5-325 MG PO TABS
ORAL_TABLET | ORAL | Status: AC
  Filled 2012-03-24: qty 1

## 2012-03-24 MED ORDER — FENTANYL CITRATE 0.05 MG/ML IJ SOLN
INTRAMUSCULAR | Status: AC | PRN
  Administered 2012-03-24 (×2): 50 ug via INTRAVENOUS

## 2012-03-24 MED ORDER — MIDAZOLAM HCL 5 MG/5ML IJ SOLN
INTRAMUSCULAR | Status: AC | PRN
  Administered 2012-03-24 (×2): 1 mg via INTRAVENOUS

## 2012-03-24 MED ORDER — SODIUM CHLORIDE 0.9 % IV SOLN
INTRAVENOUS | Status: DC
  Administered 2012-03-24: 12:00:00 via INTRAVENOUS

## 2012-03-24 NOTE — H&P (Signed)
Veronica Duarte is an 66 y.o. female.   Chief Complaint: liver lesion HPI: Patient with history of non small cell cancer of right lung and recent PET scan demonstrating hypermetabolic activity in a right posterior hepatic lobe lesion. She presents today for US/CT guided biopsy of the above liver lesion.  Past Medical History  Diagnosis Date  . Hypertension   . Diabetes mellitus     type 2- metformin  . ADENOCARCINOMA, RIGHT LUNG, UPPER LOBE 02/04/2011  . Depression   . RAS (renal artery stenosis)   . OSA (obstructive sleep apnea)   . AAA (abdominal aortic aneurysm)   . Osteoarthritis   . COPD (chronic obstructive pulmonary disease)   . Hx of colonic polyps   . CAD (coronary artery disease)     Past Surgical History  Procedure Date  . 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 Vesicovaginal fistula closure w/ tah   . Abdominal hysterectomy   . Right video-assisted thoracoscopy with wedge resection of 02/14/11    Dr Evelene Croon  . Cardiac catheterization 08/30/2009    Dr Aleen Campi  . Intraoperative transesophageal echocardiography. 08/30/2009    Dr Kipp Brood    Family History  Problem Relation Age of Onset  . Arthritis    . Hyperlipidemia    . Hypertension    . Depression     Social History:  reports that she quit smoking about 9 years ago. Her smoking use included Cigarettes. She has a 60 pack-year smoking history. She has never used smokeless tobacco. She reports that she does not drink alcohol or use illicit drugs.  Allergies: No Known Allergies  Current outpatient prescriptions:Aclidinium Bromide (TUDORZA PRESSAIR IN), Inhale 1 puff into the lungs 2 (two) times daily., Disp: , Rfl: ;  albuterol (PROAIR HFA) 108 (90 BASE) MCG/ACT inhaler, Inhale 2 puffs into the lungs every 6 (six) hours as needed., Disp: 1 Inhaler, Rfl: 3;  aspirin 81 MG tablet, Take 81 mg by mouth daily.  , Disp: , Rfl:  budesonide-formoterol  (SYMBICORT) 160-4.5 MCG/ACT inhaler, Inhale 2 puffs into the lungs 2 (two) times daily., Disp: 3 Inhaler, Rfl: 3;  cetirizine (ZYRTEC) 10 MG tablet, Take 10 mg by mouth daily., Disp: , Rfl: ;  clopidogrel (PLAVIX) 75 MG tablet, Take 75 mg by mouth daily.  , Disp: , Rfl: ;  CYMBALTA 60 MG capsule, TAKE ONE CAPSULE BY MOUTH DAILY, Disp: 30 capsule, Rfl: 11 diltiazem (DILACOR XR) 120 MG 24 hr capsule, Take 120 mg by mouth at bedtime.  , Disp: , Rfl: ;  ezetimibe (ZETIA) 10 MG tablet, Take 10 mg by mouth daily.  , Disp: , Rfl: ;  ferrous sulfate 325 (65 FE) MG tablet, Take 325 mg by mouth daily with breakfast.  , Disp: , Rfl: ;  guaiFENesin (MUCINEX) 600 MG 12 hr tablet, Take 600 mg by mouth as needed. , Disp: , Rfl: ;  losartan (COZAAR) 50 MG tablet, Take 1 tablet by mouth daily., Disp: , Rfl:  metFORMIN (GLUCOPHAGE) 500 MG tablet, Take 1 tablet (500 mg total) by mouth 2 (two) times daily with a meal., Disp: 60 tablet, Rfl: 5;  Multiple Vitamin (MULTIVITAMIN) tablet, Take 1 tablet by mouth daily.  , Disp: , Rfl: ;  Nebivolol HCl (BYSTOLIC) 20 MG TABS, Take 1 tablet by mouth daily.  , Disp: , Rfl: ;  niacin (NIASPAN) 1000 MG CR tablet, Take 1,000 mg by mouth at bedtime.  ,  Disp: , Rfl:  Risedronate Sodium (ATELVIA) 35 MG TBEC, Take 1 tablet (35 mg total) by mouth once a week., Disp: 4 tablet, Rfl: 0;  rosuvastatin (CRESTOR) 20 MG tablet, Take 20 mg by mouth daily.  , Disp: , Rfl: ;  tiotropium (SPIRIVA) 18 MCG inhalation capsule, Place 1 capsule (18 mcg total) into inhaler and inhale daily., Disp: 30 capsule, Rfl: prn;  vitamin C (ASCORBIC ACID) 500 MG tablet, Take 500 mg by mouth 2 (two) times daily.  , Disp: , Rfl:  Current facility-administered medications:0.9 %  sodium chloride infusion, , Intravenous, Continuous, D Jeananne Rama, PA  Results for orders placed during the hospital encounter of 03/06/12  GLUCOSE, CAPILLARY      Component Value Range   Glucose-Capillary 115 (*) 70 - 99 (mg/dL)   Results for  orders placed during the hospital encounter of 03/24/12  CBC      Component Value Range   WBC 6.6  4.0 - 10.5 (K/uL)   RBC 4.95  3.87 - 5.11 (MIL/uL)   Hemoglobin 14.2  12.0 - 15.0 (g/dL)   HCT 95.6  21.3 - 08.6 (%)   MCV 84.8  78.0 - 100.0 (fL)   MCH 28.7  26.0 - 34.0 (pg)   MCHC 33.8  30.0 - 36.0 (g/dL)   RDW 57.8  46.9 - 62.9 (%)   Platelets 209  150 - 400 (K/uL)  PT  14.1   INR 1.07    PTT 31  Review of Systems  Constitutional: Negative for fever and chills.  Respiratory: Positive for cough and shortness of breath.   Cardiovascular:       Occ right lower/ lateral chest discomfort with rad to back  Gastrointestinal: Negative for nausea, vomiting and abdominal pain.  Neurological: Negative for headaches.  Endo/Heme/Allergies: Does not bruise/bleed easily.    Blood pressure 125/67, pulse 65, temperature 98.6 F (37 C), temperature source Oral, resp. rate 16, height 5\' 5"  (1.651 m), weight 203 lb (92.08 kg), SpO2 95.00%. Physical Exam  Constitutional: She is oriented to person, place, and time. She appears well-developed and well-nourished.  Cardiovascular: Normal rate and regular rhythm.   Respiratory: Effort normal.       Distant BS  GI: Soft. Bowel sounds are normal.  Musculoskeletal: Normal range of motion.       Trace pretibial edema  Neurological: She is alert and oriented to person, place, and time.     Assessment/Plan Patient with non small cell cancer right lung and hypermetabolic right posterior hepatic lobe lesion; plan is for US/CT guided biopsy of liver lesion. Details/risks of procedure d/w pt/daughter with their understanding and consent.  Patriece Archbold,D KEVIN 03/24/2012, 12:19 PM

## 2012-03-24 NOTE — Discharge Instructions (Signed)
Biopsy  Care After  Refer to this sheet in the next few weeks. These instructions provide you with information on caring for yourself after your procedure. Your caregiver may also give you more specific instructions. Your treatment has been planned according to current medical practices, but problems sometimes occur. Call your caregiver if you have any problems or questions after your procedure.  If you had a fine needle biopsy, you may have soreness at the biopsy site for 1 to 2 days. If you had an open biopsy, you may have soreness at the biopsy site for 3 to 4 days.  HOME CARE INSTRUCTIONS    You may resume normal diet and activities as directed.   Change bandages (dressings) as directed. If your wound was closed with a skin glue (adhesive), it will wear off and begin to peel in 7 days.   Only take over-the-counter or prescription medicines for pain, discomfort, or fever as directed by your caregiver.   Ask your caregiver when you can bathe and get your wound wet.  SEEK IMMEDIATE MEDICAL CARE IF:    You have increased bleeding (more than a small spot) from the biopsy site.   You notice redness, swelling, or increasing pain at the biopsy site.   You have pus coming from the biopsy site.   You have a fever.   You notice a bad smell coming from the biopsy site or dressing.   You have a rash, have difficulty breathing, or have any allergic problems.  MAKE SURE YOU:    Understand these instructions.   Will watch your condition.   Will get help right away if you are not doing well or get worse.  Document Released: 05/24/2005 Document Revised: 10/24/2011 Document Reviewed: 05/02/2011  ExitCare Patient Information 2012 [employer].

## 2012-03-24 NOTE — H&P (Signed)
Agree 

## 2012-03-24 NOTE — Procedures (Signed)
Procedure:  Ultrasound guided liver biopsy Findings:  Difficult to localize liver lesion under ultrasound.  18 G core biopsy performed x 4 via 17 G needle. Plan:  3 hr recovery

## 2012-03-24 NOTE — Discharge Instructions (Signed)
Biopsy  Care After  These instructions give you information on caring for yourself after your procedure. Your doctor may also give you more specific instructions. Call your doctor if you have any problems or questions after your procedure.  HOME CARE    Return to your normal diet and activities as told by your doctor.   Change your bandages (dressings) as told by your doctor. If skin glue (adhesive) was used, it will peel off in 7 days.   Only take medicines as told by your doctor.   Ask your doctor when you can bathe and get your wound wet.  GET HELP RIGHT AWAY IF:   You see more than a small spot of blood coming from the wound.   You have redness, puffiness (swelling), or pain.   You see yellowish-white fluid (pus) coming from the wound.   You have a fever.   You notice a bad smell coming from the wound or bandage.   You have a rash, trouble breathing, or any allergy problems.  MAKE SURE YOU:    Understand these instructions.   Will watch your condition.   Will get help right away if you are not doing well or get worse.  Document Released: 07/09/2011 Document Revised: 10/24/2011 Document Reviewed: 07/09/2011  Exit[employer] Patient Information 2012 [employer].

## 2012-03-26 ENCOUNTER — Ambulatory Visit (HOSPITAL_BASED_OUTPATIENT_CLINIC_OR_DEPARTMENT_OTHER): Payer: Medicare Other | Admitting: Internal Medicine

## 2012-03-26 ENCOUNTER — Telehealth: Payer: Self-pay | Admitting: *Deleted

## 2012-03-26 ENCOUNTER — Telehealth: Payer: Self-pay | Admitting: Medical Oncology

## 2012-03-26 ENCOUNTER — Other Ambulatory Visit: Payer: Self-pay | Admitting: Medical Oncology

## 2012-03-26 VITALS — BP 113/66 | HR 71 | Temp 97.4°F | Ht 65.0 in | Wt 203.4 lb

## 2012-03-26 DIAGNOSIS — K7689 Other specified diseases of liver: Secondary | ICD-10-CM

## 2012-03-26 DIAGNOSIS — R5383 Other fatigue: Secondary | ICD-10-CM

## 2012-03-26 DIAGNOSIS — C3410 Malignant neoplasm of upper lobe, unspecified bronchus or lung: Secondary | ICD-10-CM

## 2012-03-26 DIAGNOSIS — C3490 Malignant neoplasm of unspecified part of unspecified bronchus or lung: Secondary | ICD-10-CM

## 2012-03-26 MED ORDER — FOLIC ACID 1 MG PO TABS: 1.0000 mg | ORAL_TABLET | Freq: Every day | ORAL | Status: DC

## 2012-03-26 MED ORDER — DEXAMETHASONE 4 MG PO TABS: 4.0000 mg | ORAL_TABLET | Freq: Two times a day (BID) | ORAL | Status: DC

## 2012-03-26 MED ORDER — PROCHLORPERAZINE MALEATE 10 MG PO TABS: 10.0000 mg | ORAL_TABLET | Freq: Four times a day (QID) | ORAL | Status: DC | PRN

## 2012-03-26 MED ORDER — CYANOCOBALAMIN 1000 MCG/ML IJ SOLN
1000.0000 ug | Freq: Once | INTRAMUSCULAR | Status: AC
  Administered 2012-03-26: 1000 ug via INTRAMUSCULAR

## 2012-03-26 NOTE — Telephone Encounter (Signed)
Called in dex, folic acid and compazine rx called to [facility]

## 2012-03-26 NOTE — Telephone Encounter (Signed)
Per staff message from [place], I have scheduled treatment appts for the patient. Appts in cpmputer and Crystal aware.  JMW

## 2012-03-26 NOTE — Telephone Encounter (Signed)
gve the pt her may appt calendar. Pt is aware her chemo appts will be added to the appts. Sent michelle a staff message for the appts

## 2012-03-26 NOTE — Progress Notes (Signed)
[facility] [phone]   [phone]  OFFICE PROGRESS NOTE  [name], MD, MD [address] [facility] [address] [place], [address] [place] [address]  PRINCIPAL DIAGNOSIS: Questionable metastatic non-small cell lung cancer initially diagnosed as stage IB (T2a, N0, N0) non-small cell lung cancer, adenocarcinoma, diagnosed in March 2012.   PRIOR THERAPY: Status post wedge resection of the right upper lobe under the care of Dr. Edwyna Shell on February 14, 2011.   CURRENT THERAPY: Observation.   INTERVAL HISTORY: Veronica Duarte 66 y.o. female returns to the clinic today for followup visit accompanied by her daughter Veronica Duarte. The patient is feeling fine today with no specific complaints except for mild right-sided chest pain and shortness of breath with exertion. She denied having any significant weight loss or night sweats. The patient underwent ultrasound-guided fine needle aspiration and core biopsy by interventional radiology but showed only benign liver parenchyma with no evidence of malignancy. The lesion was very small and difficult to biopsy. She is here today for evaluation and recommendation regarding treatment of her condition.  MEDICAL HISTORY: Past Medical History  Diagnosis Date  . Hypertension   . Diabetes mellitus     type 2- metformin  . ADENOCARCINOMA, RIGHT LUNG, UPPER LOBE 02/04/2011  . Depression   . RAS (renal artery stenosis)   . OSA (obstructive sleep apnea)   . AAA (abdominal aortic aneurysm)   . Osteoarthritis   . COPD (chronic obstructive pulmonary disease)   . Hx of colonic polyps   . CAD (coronary artery disease)     ALLERGIES:   has no known allergies.  MEDICATIONS:  Current Outpatient Prescriptions  Medication Sig Dispense Refill  . Aclidinium Bromide (TUDORZA PRESSAIR IN) Inhale 1 puff into the lungs 2 (two) times daily.      . budesonide-formoterol (SYMBICORT) 160-4.5 MCG/ACT inhaler Inhale 2 puffs into the lungs 2  (two) times daily.  3 Inhaler  3  . cetirizine (ZYRTEC) 10 MG tablet Take 10 mg by mouth daily.      . clopidogrel (PLAVIX) 75 MG tablet Take 75 mg by mouth daily.        . CYMBALTA 60 MG capsule TAKE ONE CAPSULE BY MOUTH DAILY  30 capsule  11  . diltiazem (DILACOR XR) 120 MG 24 hr capsule Take 120 mg by mouth at bedtime.        Marland Kitchen ezetimibe (ZETIA) 10 MG tablet Take 10 mg by mouth daily.        . ferrous sulfate 325 (65 FE) MG tablet Take 325 mg by mouth daily with breakfast.        . guaiFENesin (MUCINEX) 600 MG 12 hr tablet Take 600 mg by mouth as needed.       Marland Kitchen losartan (COZAAR) 50 MG tablet Take 1 tablet by mouth daily.      . metFORMIN (GLUCOPHAGE) 500 MG tablet Take 1 tablet (500 mg total) by mouth 2 (two) times daily with a meal.  60 tablet  5  . Multiple Vitamin (MULTIVITAMIN) tablet Take 1 tablet by mouth daily.        . Nebivolol HCl (BYSTOLIC) 20 MG TABS Take 1 tablet by mouth daily.        . niacin (NIASPAN) 1000 MG CR tablet Take 1,000 mg by mouth at bedtime.        . Risedronate Sodium (ATELVIA) 35 MG TBEC Take 1 tablet (35 mg total) by mouth once a week.  4 tablet  0  . rosuvastatin (CRESTOR) 20 MG tablet Take 20 mg by mouth daily.        . vitamin C (ASCORBIC ACID) 500 MG tablet Take 500 mg by mouth 2 (two) times daily.        Marland Kitchen albuterol (PROAIR HFA) 108 (90 BASE) MCG/ACT inhaler Inhale 2 puffs into the lungs every 6 (six) hours as needed.  1 Inhaler  3  . aspirin 81 MG tablet Take 81 mg by mouth daily.         Current Facility-Administered Medications  Medication Dose Route Frequency Provider Last Rate Last Dose  . cyanocobalamin ((VITAMIN B-12)) injection 1,000 mcg  1,000 mcg Intramuscular Once Si Gaul, MD        SURGICAL HISTORY:  Past Surgical History  Procedure Date  . 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 Vesicovaginal fistula closure w/ tah   . Abdominal hysterectomy   . Right video-assisted  thoracoscopy with wedge resection of 02/14/11    Dr Evelene Croon  . Cardiac catheterization 08/30/2009    Dr Aleen Campi  . Intraoperative transesophageal echocardiography. 08/30/2009    Dr Kipp Brood    REVIEW OF SYSTEMS:  A comprehensive review of systems was negative except for: Constitutional: positive for fatigue Respiratory: positive for dyspnea on exertion and pleurisy/chest pain   PHYSICAL EXAMINATION: General appearance: alert, cooperative and no distress Head: Normocephalic, without obvious abnormality, atraumatic Neck: no adenopathy Lymph nodes: Cervical, supraclavicular, and axillary nodes normal. Resp: clear to auscultation bilaterally Cardio: regular rate and rhythm, S1, S2 normal, no murmur, click, rub or gallop GI: soft, non-tender; bowel sounds normal; no masses,  no organomegaly Extremities: extremities normal, atraumatic, no cyanosis or edema Neurologic: Alert and oriented X 3, normal strength and tone. Normal symmetric reflexes. Normal coordination and gait  ECOG PERFORMANCE STATUS: 1 - Symptomatic but completely ambulatory  Blood pressure 113/66, pulse 71, temperature 97.4 F (36.3 C), height 5\' 5"  (1.651 m), weight 203 lb 6.4 oz (92.262 kg).  LABORATORY DATA: Lab Results  Component Value Date   WBC 6.6 03/24/2012   HGB 14.2 03/24/2012   HCT 42.0 03/24/2012   MCV 84.8 03/24/2012   PLT 209 03/24/2012      Chemistry      Component Value Date/Time   NA 140 02/21/2012 1316   NA 145 09/06/2011 0902   K 4.0 02/21/2012 1316   K 3.9 09/06/2011 0902   CL 104 02/21/2012 1316   CL 100 09/06/2011 0902   CO2 26 02/21/2012 1316   CO2 26 09/06/2011 0902   BUN 10 02/21/2012 1316   BUN 12 09/06/2011 0902   CREATININE 0.7 02/21/2012 1316   CREATININE 0.5* 09/06/2011 0902      Component Value Date/Time   CALCIUM 9.7 02/21/2012 1316   CALCIUM 9.5 09/06/2011 0902   ALKPHOS 57 12/31/2011 0925   ALKPHOS 55 09/06/2011 0902   AST 30 12/31/2011 0925   AST 24 09/06/2011 0902   ALT 26  12/31/2011 0925   BILITOT 0.7 12/31/2011 0925   BILITOT 0.70 09/06/2011 0902       RADIOGRAPHIC STUDIES: Ct Chest W Contrast  02/25/2012  *RADIOLOGY REPORT*  Clinical Data: Right lung nodule status post wedge resection of the right upper lobe  CT CHEST WITH CONTRAST  Technique:  Multidetector CT imaging of the chest was performed following the standard protocol during bolus administration of intravenous contrast.  Contrast: 80mL OMNIPAQUE IOHEXOL 300  MG/ML  SOLN  Comparison: Chest x-ray 02/19/2012 and CT of the chest 09/06/2011  Findings: Again noted post surgical changes post wedge resection of right upper lobe.  Bilateral emphysematous changes are stable.  Stable scarring in the right upper lobe anteriorly.  There is a nodule in the right upper lobe just anterior to the fissure measures 9.6 x 9.2 mm. This is best visualized in the axial image 25.  This has increased in size from prior exam.  There is a second nodule in the right upper lobe just anterior to the scar measures one by 0.6 cm.  This are suspicious for recurrent or metastatic disease.  Further evaluation with PET scan is recommended.  No acute infiltrate or pleural effusion.  No pulmonary edema.  There is no density probable cyst in the right hepatic dome anteriorly measures 1.2 cm stable in size and appearance from prior exam.  Sagittal images of the spine shows osteopenia and degenerative changes thoracic spine.  The patient is status post median sternotomy.  Atherosclerotic calcifications of the coronary arteries are noted.  There are atherosclerotic calcifications of the thoracic aorta.  Precarinal lymph node measures one by 0.7 cm without significant change.  No hilar adenopathy noted. Atherosclerotic calcifications of the splenic artery.  The visualized tail of the pancreas is unremarkable.  No adrenal gland mass is noted.  IMPRESSION:  1.  Bilateral emphysematous changes again noted.  Stable precarinal lymph node.  No hilar adenopathy.  2.There is a nodule in the right upper lobe just anterior to the fissure measures 9.6 x 9.2 mm. This is best visualized in the axial image 25.  This has increased in size from prior exam.  There is a second nodule in the right upper lobe just anterior to the scar measures one by 0.6 cm.  This are suspicious for recurrent or metastatic disease.  Further evaluation with PET scan is recommended. 3.  No acute infiltrate or pleural effusion.  No pulmonary edema.  Original Report Authenticated By: Natasha Mead, M.D.   Nm Pet Image Restag (ps) Skull Base To Thigh  03/06/2012  *RADIOLOGY REPORT*  Clinical Data: Subsequent treatment strategy for lung adenocarcinoma.  NUCLEAR MEDICINE PET SKULL BASE TO THIGH  Fasting Blood Glucose:  115  Technique:  17.7 mCi F-18 FDG was injected intravenously. CT data was obtained and used for attenuation correction and anatomic localization only.  (This was not acquired as a diagnostic CT examination.) Additional exam technical data entered on technologist worksheet.  Comparison:  01/21/2011  Findings:  Neck: No hypermetabolic lymph nodes in the neck.  Chest:  Postsurgical scarring noted in the right upper lobe.  A 7 mm pulmonary nodule is seen posteriorly along the major fissure on image 80 which has a maximum SUV of 6.1.  A second 10 mm nodule is seen inferior to this in the right midlung on image 86 which also shows hypermetabolic activity with maximum SUV of 1.7.  Both of these suspicious for pulmonary metastases.  Mild right hilar and right paratracheal and precarinal lymphadenopathy is now seen which is hypermetabolic, with maximum SUV measuring 28.2.  In addition, there is a new hypermetabolic lymph node in the right cardiophrenic angle measuring 1.3 cm, also consistent with metastatic disease.  Abdomen/Pelvis:  A focal area of hypermetabolic activity is seen in the posterior right hepatic lobe which is subcapsular in location on image 123, and was not seen on previous study.   Noncontrast CT images show a possible subtle low attenuation lesion  at this site of the liver, and a liver metastasis cannot be excluded.  No abnormal hypermetabolic activity within the pancreas, adrenal glands, or spleen.  No hypermetabolic lymph nodes in the abdomen or pelvis. Incidentally noted on this exam are multiple tiny gallstones and a nonobstructing right renal calculus.  [place]:  No focal hypermetabolic activity to suggest skeletal metastasis.  IMPRESSION:  1.  Two hypermetabolic right lung nodules, suspicious for pulmonary metastases. 2.  Hypermetabolic metastatic lymphadenopathy in the right hilum, mediastinum, and right cardiophrenic angle. 3.  Question small hypermetabolic lesion in the posterior segment of the right hepatic lobe.  Liver metastasis cannot be excluded. Abdomen MRI without and with contrast is recommended for further evaluation.  Original Report Authenticated By: Danae Orleans, M.D.   US Biopsy  03/24/2012  *RADIOLOGY REPORT*  Clinical Data: Recurrent lung carcinoma.  Hypermetabolic lesion in the liver demonstrated by PET scan.  ULTRASOUND GUIDED CORE BIOPSY OF LIVER  Sedation:  2.0 mg IV Versed;  100 mcg IV Fentanyl  Total Moderate Sedation Time: 40 minutes.  Procedure:  The procedure, risks, benefits, and alternatives were explained to the patient.  Questions regarding the procedure were encouraged and answered.  The patient understands and consents to the procedure.  The right abdominal wall was prepped with betadine in a sterile fashion, and a sterile drape was applied covering the operative field.  A sterile gown and sterile gloves were used for the procedure. Local anesthesia was provided with 1% Lidocaine.  Ultrasound was used to localize a liver lesion in the right lobe. Under ultrasound guidance, a 17 gauge needle was advanced into the liver.  Core biopsy was performed with an 18 gauge device.  A total of four samples were obtained and submitted in formalin.  Complications:  None  Findings: A rounded, roughly 9 mm hypoechoic lesion was localized in the posterior aspect of the right lobe of the liver in the region of increased metabolic activity noted on recent PET scan of 03/06/2012.  This lesion predominately was located under a rib and did show significant variance in position with respiration.  It was difficult to accurately place a needle to the level of the lesion. It was felt by ultrasound that the outer needle was located close to the edge of the lesion.  Depending on results of the biopsy, there may be some degree of sampling error due to the difficult nature of the biopsy.  IMPRESSION Ultrasound guided core biopsy performed of a small lesion in the peripheral right lobe of the liver.  This lesion was difficult to accurately sample given its location and movement with respiration.  Original Report Authenticated By: Reola Calkins, M.D.    ASSESSMENT: This is a very pleasant 66 years old white female with recurrent non-small cell lung cancer most likely adenocarcinoma. The recent liver biopsy was not conclusive for malignancy but the lesion was very small and difficult to biopsy.  PLAN: I have a lengthy discussion with the patient and her daughter about her current condition. I recommended for: #1 I would him the patient to Dr. Laneta Simmers for consideration of repeat bronchoscopy with endobronchial ultrasound for tissue diagnosis and also for Port-A-Cath placement. #2 I discussed with the patient systemic chemotherapy with carboplatin for AUC of 5 and Alimta 500 mg/M2 every 3 weeks. I discussed with the patient adverse effect of the chemotherapy including but not limited to alopecia, myelosuppression, peripheral neuropathy, nausea and vomiting, liver or renal dysfunction. The patient will receive vitamin B12 injection  today.  She was also given prescription for Compazine 10 mg by mouth every 6 hours as needed for nausea, folic acid 1 mg by mouth daily and Decadron 4 mg by  mouth twice a day day before, day of and day after the chemotherapy. #3 she is expected to start the first cycle of her chemotherapy next week. The patient would have a chemotherapy education class before the first cycle. 3 she'll come back for followup visit in 2 weeks for evaluation and management any adverse effect of her chemotherapy. She was advised to call me immediately if she has any concerning symptoms in the interval.   All questions were answered. The patient knows to call the clinic with any problems, questions or concerns. We can certainly see the patient much sooner if necessary.  I spent 20 minutes counseling the patient face to face. The total time spent in the appointment was 30 minutes.

## 2012-03-27 ENCOUNTER — Telehealth: Payer: Self-pay | Admitting: Medical Oncology

## 2012-03-30 ENCOUNTER — Other Ambulatory Visit: Payer: Self-pay | Admitting: *Deleted

## 2012-03-30 ENCOUNTER — Telehealth: Payer: Self-pay | Admitting: Internal Medicine

## 2012-03-30 NOTE — Telephone Encounter (Signed)
dtr states Dr Edwyna Shell is doing the port procedure

## 2012-03-30 NOTE — Telephone Encounter (Signed)
S/w Veronica Duarte from dr Asa Lente office who stated that the pt will get her port placed with dr burney's office when she see's him this week

## 2012-03-31 ENCOUNTER — Telehealth: Payer: Self-pay | Admitting: Internal Medicine

## 2012-03-31 ENCOUNTER — Encounter: Payer: Self-pay | Admitting: *Deleted

## 2012-03-31 ENCOUNTER — Other Ambulatory Visit: Payer: Medicare Other

## 2012-03-31 NOTE — Telephone Encounter (Signed)
lmonvm adviisng the pt's dtr of the new start d/t of the appts due to the pt has not had her port placed as yet. Per diane dr burney's office will place the port and set up that appt.

## 2012-04-01 ENCOUNTER — Ambulatory Visit (INDEPENDENT_AMBULATORY_CARE_PROVIDER_SITE_OTHER): Payer: Medicare Other | Admitting: Surgery

## 2012-04-01 ENCOUNTER — Other Ambulatory Visit: Payer: Self-pay | Admitting: *Deleted

## 2012-04-01 ENCOUNTER — Encounter: Payer: Self-pay | Admitting: Surgery

## 2012-04-01 VITALS — BP 125/70 | HR 62 | Resp 16 | Ht 64.0 in | Wt 203.0 lb

## 2012-04-01 DIAGNOSIS — K7689 Other specified diseases of liver: Secondary | ICD-10-CM

## 2012-04-01 DIAGNOSIS — K769 Liver disease, unspecified: Secondary | ICD-10-CM

## 2012-04-01 DIAGNOSIS — C3490 Malignant neoplasm of unspecified part of unspecified bronchus or lung: Secondary | ICD-10-CM

## 2012-04-01 DIAGNOSIS — R911 Solitary pulmonary nodule: Secondary | ICD-10-CM

## 2012-04-01 DIAGNOSIS — Z85118 Personal history of other malignant neoplasm of bronchus and lung: Secondary | ICD-10-CM

## 2012-04-01 DIAGNOSIS — J984 Other disorders of lung: Secondary | ICD-10-CM

## 2012-04-01 MED ORDER — LIDOCAINE-PRILOCAINE 2.5-2.5 % EX CREA: TOPICAL_CREAM | CUTANEOUS | Status: DC | PRN

## 2012-04-01 NOTE — Telephone Encounter (Signed)
Called and discussed application of EMLA cream with daughter, she verbalized understanding.  SLJ

## 2012-04-01 NOTE — Progress Notes (Signed)
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 This was treated with wedge resection instead of lobectomy due to her severe emphysema with an FEV1 of 0.94. The final pathology showed this to be a T2 moderately to poorly differentiated adenocarcinoma with tumor extending through the visceral pleura as well as lymphovascular invasion. The resection margin was negative for tumor. Her preoperative PET scan showed no other abnormal hypermetabolic activity. She has been followed by Dr. Arbutus Ped and recently began having right chest wall pain associated with cough and shortness of breath. She was seen by Dr. Maple Hudson and underwent CT and PET scan which showed evidence of metastatic disease in the right lung with a questionable small liver lesion. MRI of the brain was performed in March 2013 for workup of possible dementia and this was negative for metastasis. She recently underwent CT-guided needle biopsy of the liver lesion which was negative for carcinoma. Plans have apparently been made for her to start chemotherapy next Thursday and it has been requested that we try to obtain tissue to confirm metastatic disease and for mutation testing. She also requires a Port-A-Cath for chemotherapy.  Past Medical History  Diagnosis Date  . Hypertension   . Diabetes mellitus     type 2- metformin  . ADENOCARCINOMA, RIGHT LUNG, UPPER LOBE 02/04/2011  . Depression   . RAS (renal artery stenosis)   . OSA (obstructive sleep apnea)   . AAA (abdominal aortic aneurysm)   . Osteoarthritis   . COPD (chronic obstructive pulmonary disease)   . Hx of colonic polyps   . CAD (coronary artery disease)      Past Surgical History  Procedure Date  . 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 Vesicovaginal fistula closure w/ tah   . Abdominal hysterectomy   . Right video-assisted thoracoscopy with wedge resection of 02/14/11    Dr Evelene Croon  . Cardiac catheterization 08/30/2009    Dr Aleen Campi  . Intraoperative transesophageal echocardiography. 08/30/2009    Dr Kipp Brood    Family History  Problem Relation Age of Onset  . Arthritis    . Hyperlipidemia    . Hypertension    . Depression      Social History History  Substance Use Topics  . Smoking status: Former Smoker -- 2.0 packs/day for 30 years    Types: Cigarettes    Quit date: 11/18/2002  . Smokeless tobacco: Never Used  . Alcohol Use: No    Current Outpatient Prescriptions  Medication Sig Dispense Refill  . Aclidinium Bromide (TUDORZA PRESSAIR IN) Inhale 1 puff into the lungs 2 (two) times daily.      Marland Kitchen albuterol (PROAIR HFA) 108 (90 BASE) MCG/ACT inhaler Inhale 2 puffs into the lungs every 6 (six) hours as needed.  1 Inhaler  3  . budesonide-formoterol (SYMBICORT) 160-4.5 MCG/ACT inhaler Inhale 2 puffs into the lungs 2 (two) times daily.  3 Inhaler  3  . cetirizine (ZYRTEC) 10  MG tablet Take 10 mg by mouth daily as needed.       . CYMBALTA 60 MG capsule TAKE ONE CAPSULE BY MOUTH DAILY  30 capsule  11  . dexamethasone (DECADRON) 4 MG tablet Take 4 mg by mouth as needed.      . diltiazem (DILACOR XR) 120 MG 24 hr capsule Take 120 mg by mouth at bedtime.        Marland Kitchen ezetimibe (ZETIA) 10 MG tablet Take 10 mg by mouth daily.        . ferrous sulfate 325 (65 FE) MG tablet Take 325 mg by mouth daily with breakfast.        . folic acid (FOLVITE) 1 MG tablet Take 1 tablet (1 mg total) by mouth daily.  30 tablet  3  . guaiFENesin (MUCINEX) 600 MG 12 hr tablet Take 600 mg by mouth as needed.       . lidocaine-prilocaine (EMLA) cream Apply topically as needed. Apply to port  1 hour before chemo appointment  30 g  0  . losartan (COZAAR) 50 MG tablet Take 1 tablet by mouth daily.      . metFORMIN (GLUCOPHAGE) 500 MG tablet Take 1 tablet (500 mg total) by mouth 2 (two) times daily with a meal.  60 tablet  5  . Multiple Vitamin (MULTIVITAMIN) tablet Take 1 tablet by mouth daily.        . Nebivolol HCl (BYSTOLIC) 20 MG TABS Take 1 tablet by mouth daily.        . niacin (NIASPAN) 1000 MG CR tablet Take 1,000 mg by mouth at bedtime.        . prochlorperazine (COMPAZINE) 10 MG tablet Take 1 tablet (10 mg total) by mouth every 6 (six) hours as needed.  30 tablet  0  . Risedronate Sodium (ATELVIA) 35 MG TBEC Take 1 tablet (35 mg total) by mouth once a week.  4 tablet  0  . rosuvastatin (CRESTOR) 20 MG tablet Take 20 mg by mouth daily.        . vitamin C (ASCORBIC ACID) 500 MG tablet Take 500 mg by mouth 2 (two) times daily.        Marland Kitchen aspirin 81 MG tablet Take 81 mg by mouth daily.        . clopidogrel (PLAVIX) 75 MG tablet Take 75 mg by mouth daily.          No Known Allergies  Review of Systems  Constitutional: Positive for fatigue. Negative for fever, chills, diaphoresis, activity change, appetite change and unexpected weight change.  HENT: Negative.   Eyes: Negative.   Respiratory: Positive for cough and shortness of breath.        Right lateral chest wall discomfort  Cardiovascular: Negative.   Gastrointestinal: Negative.   Genitourinary: Negative.   Musculoskeletal: Negative.   Neurological:       Some memory loss   Hematological: Negative.   Psychiatric/Behavioral: Negative.     BP 125/70  Pulse 62  Resp 16  Ht 5\' 4"  (1.626 m)  Wt 203 lb (92.08 kg)  BMI 34.84 kg/m2  SpO2 94% Physical Exam  Constitutional: She is oriented to person, place, and time. She appears well-developed and well-nourished. She appears distressed.  HENT:  Head: Normocephalic and atraumatic.  Mouth/Throat: Oropharynx is clear and moist.  Eyes: Conjunctivae and EOM are normal.  Pupils are equal, round, and reactive to light. No scleral icterus.  Neck: Normal range of motion. Neck supple. No JVD  present. No tracheal deviation present. No thyromegaly present.  Cardiovascular: Normal rate, regular rhythm, normal heart sounds and intact distal pulses.  Exam reveals no gallop and no friction rub.   No murmur heard. Pulmonary/Chest: Effort normal and breath sounds normal. No respiratory distress. She has no wheezes. She has no rales. She exhibits no tenderness.  Abdominal: Soft. Bowel sounds are normal. She exhibits no distension and no mass. There is no tenderness.  Musculoskeletal: Normal range of motion. She exhibits no edema.  Lymphadenopathy:    She has cervical adenopathy.  Neurological: She is alert and oriented to person, place, and time. She has normal strength. No cranial nerve deficit or sensory deficit.  Skin: Skin is warm and dry.  Psychiatric: She has a normal mood and affect.     Diagnostic Tests:  *RADIOLOGY REPORT*   Clinical Data: Subsequent treatment strategy for lung adenocarcinoma.   NUCLEAR MEDICINE PET SKULL BASE TO THIGH   Fasting Blood Glucose:  115   Technique:  17.7 mCi F-18 FDG was injected intravenously. CT data was obtained and used for attenuation correction and anatomic localization only.  (This was not acquired as a diagnostic CT examination.) Additional exam technical data entered on technologist worksheet.   Comparison:  01/21/2011   Findings:   Neck: No hypermetabolic lymph nodes in the neck.   Chest:  Postsurgical scarring noted in the right upper lobe.  A 7 mm pulmonary nodule is seen posteriorly along the major fissure on image 80 which has a maximum SUV of 6.1.  A second 10 mm nodule is seen inferior to this in the right midlung on image 86 which also shows hypermetabolic activity with maximum SUV of 1.7.  Both of these suspicious for pulmonary metastases.   Mild right hilar and right paratracheal and  precarinal lymphadenopathy is now seen which is hypermetabolic, with maximum SUV measuring 28.2.  In addition, there is a new hypermetabolic lymph node in the right cardiophrenic angle measuring 1.3 cm, also consistent with metastatic disease.   Abdomen/Pelvis:  A focal area of hypermetabolic activity is seen in the posterior right hepatic lobe which is subcapsular in location on image 123, and was not seen on previous study.  Noncontrast CT images show a possible subtle low attenuation lesion at this site of the liver, and a liver metastasis cannot be excluded.   No abnormal hypermetabolic activity within the pancreas, adrenal glands, or spleen.  No hypermetabolic lymph nodes in the abdomen or pelvis. Incidentally noted on this exam are multiple tiny gallstones and a nonobstructing right renal calculus.   [place]:  No focal hypermetabolic activity to suggest skeletal metastasis.   IMPRESSION:   1.  Two hypermetabolic right lung nodules, suspicious for pulmonary metastases. 2.  Hypermetabolic metastatic lymphadenopathy in the right hilum, mediastinum, and right cardiophrenic angle. 3.  Question small hypermetabolic lesion in the posterior segment of the right hepatic lobe.  Liver metastasis cannot be excluded. Abdomen MRI without and with contrast is recommended for further evaluation.   Original Report Authenticated By: Danae Orleans, M.D.    Impression:  The PET scan shows probable metastatic disease involving the pretracheal and right hilar lymph nodes as well as 2 lesions in the right lung. I think the best method for biopsy would be mediastinoscopy with removal of the pretracheal lymph nodes. This would give [place] enough tissue for mutation testing. If these lymph nodes are negative for cancer then we'll plan to perform endobronchial ultrasound biopsy of the hilar lymph nodes,  although we may not obtain enough tissue for mutation testing. I also plan to insert a Port-A-Cath in  the left subclavian vein. I discussed the operative procedure with the patient and her daughter who is a Engineer, civil (consulting). We discussed alternatives, benefits, and risks including but not limited to bleeding, blood transfusion, injury to mediastinal structures, pneumothorax, and inability to obtain a firm tissue diagnosis. We also discussed the possibility that she could develop infection related to a Port-A-Cath or venous thrombosis. She understands all of this and agrees to proceed.   Plan:  We will plan to perform left subclavian Port-A-Cath as well as mediastinoscopy and possible endobronchial ultrasound either on Friday of this week or Monday next week. Our office will call her to schedule that.

## 2012-04-02 ENCOUNTER — Ambulatory Visit: Payer: Medicare Other

## 2012-04-02 ENCOUNTER — Encounter (HOSPITAL_COMMUNITY): Payer: Self-pay | Admitting: Pharmacy Technician

## 2012-04-02 ENCOUNTER — Encounter (HOSPITAL_COMMUNITY)
Admission: RE | Admit: 2012-04-02 | Discharge: 2012-04-02 | Disposition: A | Discharge status: Home / Self Care | Payer: Medicare Other | Source: Ambulatory Visit | Attending: Surgery | Admitting: Surgery

## 2012-04-02 ENCOUNTER — Other Ambulatory Visit: Payer: Medicare Other | Admitting: Lab

## 2012-04-02 ENCOUNTER — Encounter (HOSPITAL_COMMUNITY): Payer: Self-pay | Admitting: *Deleted

## 2012-04-02 ENCOUNTER — Other Ambulatory Visit: Payer: Self-pay

## 2012-04-02 DIAGNOSIS — C3490 Malignant neoplasm of unspecified part of unspecified bronchus or lung: Secondary | ICD-10-CM

## 2012-04-02 LAB — COMPREHENSIVE METABOLIC PANEL
ALT: 20 U/L (ref 0–35)
Alkaline Phosphatase: 56 U/L (ref 39–117)
BUN: 11 mg/dL (ref 6–23)
CO2: 23 mEq/L (ref 19–32)
Chloride: 104 mEq/L (ref 96–112)
GFR calc Af Amer: >90 mL/min (ref >90)
Glucose, Bld: 118 mg/dL — ABNORMAL HIGH (ref 70–99)
Potassium: 4.3 mEq/L (ref 3.5–5.1)
Sodium: 141 mEq/L (ref 135–145)
Total Bilirubin: 0.4 mg/dL (ref 0.3–1.2)
Total Protein: 7 g/dL (ref 6.0–8.3)

## 2012-04-02 LAB — CBC
HCT: 40.8 % (ref 36.0–46.0)
Hemoglobin: 13.5 g/dL (ref 12.0–15.0)
MCHC: 33.1 g/dL (ref 30.0–36.0)
RBC: 4.78 MIL/uL (ref 3.87–5.11)
WBC: 6.5 10*3/uL (ref 4.0–10.5)

## 2012-04-02 LAB — APTT: aPTT: 30 seconds (ref 24–37)

## 2012-04-02 LAB — PROTIME-INR: Prothrombin Time: 14 seconds (ref 11.6–15.2)

## 2012-04-02 LAB — SURGICAL PCR SCREEN: Staphylococcus aureus: NEGATIVE

## 2012-04-02 MED ORDER — DEXTROSE 5 % IV SOLN
1.5000 g | INTRAVENOUS | Status: DC
  Filled 2012-04-02: qty 1.5

## 2012-04-02 NOTE — Consult Note (Signed)
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  She is s/p wedge resection of a RUL adenocarcinoma on 02/14/11 (wedge resection was done instead of a RU lobectomy due to her severe emphysema with an FEV1 of 0.94). Recent CT and PET scan showed evidence of metastatic disease in the right lung with questionable small liver lesion (with eventual negative liver biopsy).  She is planning to start chemo and a tissue sample to confirm metastatic disease and for mutation testing was requested by Oncology.  Other history includes HTN, DM2, depression, renal artery stenosis, OSA, OA, COPD, CAD s/p CABG 08/2009.  History in Epic also lists AAA, I spoke with staff at [facility] who say an abdominal U/S at their office in November 2012 mentioned normal diameter for patient's abdominal aorta.  They also faxed a copy of a renal U/S ([facility]) on 09/27/11 that mentioned "abdominal aorta demonstrating normal taper with a mild amount of atherosclerosis."    EKG on 04/02/12 showed SR with PACs.  Labs show CBC, coags WNL.  Glucose 118, Cr 0.70.  PAT RN to follow-up on urine specimen--no collected according to Epic.  CXR report from 04/02/12 showed: 1. Stable postoperative findings the right upper lobe.  2. Stable right lung nodularity.  3. No acute findings.  Her Cardiologist is Dr. Tresa Endo [facility]), last visit 05/09/11.  She was having short bursts of PAF by event monitor and Cardizem and Plavix were added.  Echo from 04/09/11 showed LVEF 50-55%, possible mid to distal anterior hypokinesis, indeterminate diastolic function, mild LAE, mild TR, trace MR, normal RVSP.  Stress test from 09/26/10 showed no significant ischemia, EF 74%,  low risk scan.  Plan to proceed if no acute CV symptoms.  Shonna Chock, PA-C

## 2012-04-02 NOTE — Progress Notes (Signed)
Spoke with Revonda Standard, [place] regarding history. Requested records from [facility]

## 2012-04-02 NOTE — Pre-Procedure Instructions (Signed)
20 SHUNTIA EXTON  04/02/2012   Your procedure is scheduled on:  May 17  Report to [facility] at 0830 AM.  Call this number if you have problems the morning of surgery: [phone]   Remember:   Do not eat food:After Midnight.  May have clear liquids: up to 4 Hours before arrival.  Clear liquids include soda, tea, black coffee, apple or grape juice, broth.  Take these medicines the morning of surgery with A SIP OF WATER: Albuterol, Symbicort, Zyrtec, Decadron, Cymbalta, Zetia, Bystolic, Compazine   Do not wear jewelry, make-up or nail polish.  Do not wear lotions, powders, or perfumes. You may wear deodorant.  Do not shave 48 hours prior to surgery. Men may shave face and neck.  Do not bring valuables to the hospital.  Contacts, dentures or bridgework may not be worn into surgery.  Leave suitcase in the car. After surgery it may be brought to your room.  For patients admitted to the hospital, checkout time is 11:00 AM the day of discharge.   Patients discharged the day of surgery will not be allowed to drive home.  Daughter will be at hospital to drive patient home.  Special Instructions: CHG Shower Use Special Wash: 1/2 bottle night before surgery and 1/2 bottle morning of surgery.   Please read over the following fact sheets that you were given: Pain Booklet, Coughing and Deep Breathing, Blood Transfusion Information, Lab Information and Surgical Site Infection Prevention

## 2012-04-02 NOTE — Progress Notes (Signed)
No urine collected from patient at pread visit.do dos

## 2012-04-03 ENCOUNTER — Encounter (HOSPITAL_COMMUNITY)
Admission: RE | Disposition: A | Discharge status: Home / Self Care | Payer: Self-pay | Source: Ambulatory Visit | Attending: Surgery

## 2012-04-03 ENCOUNTER — Encounter (HOSPITAL_COMMUNITY): Payer: Self-pay | Admitting: *Deleted

## 2012-04-03 ENCOUNTER — Ambulatory Visit (HOSPITAL_COMMUNITY): Payer: Medicare Other | Admitting: Vascular Surgery

## 2012-04-03 ENCOUNTER — Ambulatory Visit (HOSPITAL_COMMUNITY): Payer: Medicare Other

## 2012-04-03 ENCOUNTER — Ambulatory Visit (HOSPITAL_COMMUNITY)
Admission: RE | Admit: 2012-04-03 | Discharge: 2012-04-03 | Disposition: A | Discharge status: Home / Self Care | Payer: Medicare Other | Source: Ambulatory Visit | Attending: Surgery | Admitting: Surgery

## 2012-04-03 ENCOUNTER — Encounter (HOSPITAL_COMMUNITY): Payer: Self-pay | Admitting: Vascular Surgery

## 2012-04-03 DIAGNOSIS — I2510 Atherosclerotic heart disease of native coronary artery without angina pectoris: Secondary | ICD-10-CM | POA: Insufficient documentation

## 2012-04-03 DIAGNOSIS — Z01812 Encounter for preprocedural laboratory examination: Secondary | ICD-10-CM | POA: Insufficient documentation

## 2012-04-03 DIAGNOSIS — E119 Type 2 diabetes mellitus without complications: Secondary | ICD-10-CM | POA: Insufficient documentation

## 2012-04-03 DIAGNOSIS — Z01818 Encounter for other preprocedural examination: Secondary | ICD-10-CM | POA: Insufficient documentation

## 2012-04-03 DIAGNOSIS — J4489 Other specified chronic obstructive pulmonary disease: Secondary | ICD-10-CM | POA: Insufficient documentation

## 2012-04-03 DIAGNOSIS — I10 Essential (primary) hypertension: Secondary | ICD-10-CM | POA: Insufficient documentation

## 2012-04-03 DIAGNOSIS — G4733 Obstructive sleep apnea (adult) (pediatric): Secondary | ICD-10-CM | POA: Insufficient documentation

## 2012-04-03 DIAGNOSIS — Z01810 Encounter for preprocedural cardiovascular examination: Secondary | ICD-10-CM | POA: Insufficient documentation

## 2012-04-03 DIAGNOSIS — C969 Malignant neoplasm of lymphoid, hematopoietic and related tissue, unspecified: Secondary | ICD-10-CM | POA: Insufficient documentation

## 2012-04-03 DIAGNOSIS — C3490 Malignant neoplasm of unspecified part of unspecified bronchus or lung: Secondary | ICD-10-CM

## 2012-04-03 DIAGNOSIS — J449 Chronic obstructive pulmonary disease, unspecified: Secondary | ICD-10-CM | POA: Insufficient documentation

## 2012-04-03 HISTORY — PX: MEDIASTINOSCOPY: SHX5086

## 2012-04-03 HISTORY — PX: PORTACATH PLACEMENT: SHX2246

## 2012-04-03 LAB — TYPE AND SCREEN: Antibody Screen: NEGATIVE

## 2012-04-03 LAB — URINALYSIS, ROUTINE W REFLEX MICROSCOPIC
Bilirubin Urine: NEGATIVE
Glucose, UA: NEGATIVE mg/dL
Ketones, ur: NEGATIVE mg/dL
Nitrite: POSITIVE — AB
Specific Gravity, Urine: 1.022 (ref 1.005–1.030)
pH: 5.5 (ref 5.0–8.0)

## 2012-04-03 LAB — GLUCOSE, CAPILLARY: Glucose-Capillary: 112 mg/dL — ABNORMAL HIGH (ref 70–99)

## 2012-04-03 LAB — URINE MICROSCOPIC-ADD ON

## 2012-04-03 SURGERY — MEDIASTINOSCOPY
Anesthesia: General | Site: Chest | Wound class: Clean Contaminated

## 2012-04-03 MED ORDER — FENTANYL CITRATE 0.05 MG/ML IJ SOLN
INTRAMUSCULAR | Status: DC | PRN
  Administered 2012-04-03: 100 ug via INTRAVENOUS
  Administered 2012-04-03: 50 ug via INTRAVENOUS
  Administered 2012-04-03: 150 ug via INTRAVENOUS

## 2012-04-03 MED ORDER — NEOSTIGMINE METHYLSULFATE 1 MG/ML IJ SOLN
INTRAMUSCULAR | Status: DC | PRN
  Administered 2012-04-03: 3 mg via INTRAVENOUS

## 2012-04-03 MED ORDER — HEMOSTATIC AGENTS (NO CHARGE) OPTIME
TOPICAL | Status: DC | PRN
  Administered 2012-04-03: 1 via TOPICAL

## 2012-04-03 MED ORDER — HYDROMORPHONE HCL PF 1 MG/ML IJ SOLN: 0.2500 mg | INTRAMUSCULAR | Status: DC | PRN

## 2012-04-03 MED ORDER — ONDANSETRON HCL 4 MG/2ML IJ SOLN: 4.0000 mg | Freq: Once | INTRAMUSCULAR | Status: DC | PRN

## 2012-04-03 MED ORDER — VECURONIUM BROMIDE 10 MG IV SOLR
INTRAVENOUS | Status: DC | PRN
  Administered 2012-04-03: 1 mg via INTRAVENOUS
  Administered 2012-04-03: 8 mg via INTRAVENOUS

## 2012-04-03 MED ORDER — LACTATED RINGERS IV SOLN
INTRAVENOUS | Status: DC | PRN
  Administered 2012-04-03 (×2): via INTRAVENOUS

## 2012-04-03 MED ORDER — HEPARIN SODIUM (PORCINE) 1000 UNIT/ML DIALYSIS
INTRAMUSCULAR | Status: DC | PRN
  Administered 2012-04-03: 3000 [IU]

## 2012-04-03 MED ORDER — LACTATED RINGERS IV SOLN
INTRAVENOUS | Status: DC
  Administered 2012-04-03: 12:00:00 via INTRAVENOUS

## 2012-04-03 MED ORDER — ONDANSETRON HCL 4 MG/2ML IJ SOLN
INTRAMUSCULAR | Status: DC | PRN
  Administered 2012-04-03: 4 mg via INTRAVENOUS

## 2012-04-03 MED ORDER — TRAMADOL HCL 50 MG PO TABS
50.0000 mg | ORAL_TABLET | Freq: Once | ORAL | Status: DC
  Filled 2012-04-03: qty 1

## 2012-04-03 MED ORDER — LIDOCAINE HCL (CARDIAC) 20 MG/ML IV SOLN
INTRAVENOUS | Status: DC | PRN
  Administered 2012-04-03: 75 mg via INTRAVENOUS

## 2012-04-03 MED ORDER — TRAMADOL HCL 50 MG PO TABS: 50.0000 mg | ORAL_TABLET | Freq: Four times a day (QID) | ORAL | Status: AC | PRN

## 2012-04-03 MED ORDER — GLYCOPYRROLATE 0.2 MG/ML IJ SOLN
INTRAMUSCULAR | Status: DC | PRN
  Administered 2012-04-03: .6 mg via INTRAVENOUS

## 2012-04-03 MED ORDER — MIDAZOLAM HCL 5 MG/5ML IJ SOLN
INTRAMUSCULAR | Status: DC | PRN
  Administered 2012-04-03: 2 mg via INTRAVENOUS

## 2012-04-03 MED ORDER — PROPOFOL 10 MG/ML IV EMUL
INTRAVENOUS | Status: DC | PRN
  Administered 2012-04-03: 150 mg via INTRAVENOUS
  Administered 2012-04-03: 50 mg via INTRAVENOUS

## 2012-04-03 MED ORDER — 0.9 % SODIUM CHLORIDE (POUR BTL) OPTIME
TOPICAL | Status: DC | PRN
  Administered 2012-04-03: 1000 mL

## 2012-04-03 MED ORDER — SODIUM CHLORIDE 0.9 % IR SOLN
Status: DC | PRN
  Administered 2012-04-03: 13:00:00

## 2012-04-03 SURGICAL SUPPLY — 72 items
ADH SKN CLS APL DERMABOND .7 (GAUZE/BANDAGES/DRESSINGS) ×3
ADH SKN CLS LQ APL DERMABOND (GAUZE/BANDAGES/DRESSINGS) ×6
BAG DECANTER FOR FLEXI CONT (MISCELLANEOUS) ×4 IMPLANT
BLADE SURG 10 STRL SS (BLADE) ×4 IMPLANT
BLADE SURG 15 STRL LF DISP TIS (BLADE) ×3 IMPLANT
BLADE SURG 15 STRL SS (BLADE) ×4
BRUSH CYTOL CELLEBRITY 1.5X140 (MISCELLANEOUS) IMPLANT
CANISTER SUCTION 2500CC (MISCELLANEOUS) ×4 IMPLANT
CLIP TI MEDIUM 6 (CLIP) ×4 IMPLANT
CLOTH BEACON ORANGE TIMEOUT ST (SAFETY) ×8 IMPLANT
CONT SPEC 4OZ CLIKSEAL STRL BL (MISCELLANEOUS) ×12 IMPLANT
COVER SURGICAL LIGHT HANDLE (MISCELLANEOUS) ×8 IMPLANT
COVER TABLE BACK 60X90 (DRAPES) IMPLANT
DERMABOND ADHESIVE PROPEN (GAUZE/BANDAGES/DRESSINGS) ×2
DERMABOND ADVANCED (GAUZE/BANDAGES/DRESSINGS) ×1
DERMABOND ADVANCED .7 DNX12 (GAUZE/BANDAGES/DRESSINGS) ×3 IMPLANT
DERMABOND ADVANCED .7 DNX6 (GAUZE/BANDAGES/DRESSINGS) ×6 IMPLANT
DRAPE C-ARM 42X72 X-RAY (DRAPES) ×4 IMPLANT
DRAPE CHEST BREAST 15X10 FENES (DRAPES) ×4 IMPLANT
DRAPE INCISE IOBAN 66X45 STRL (DRAPES) ×4 IMPLANT
DRAPE LAPAROTOMY T 102X78X121 (DRAPES) ×4 IMPLANT
ELECT CAUTERY BLADE 6.4 (BLADE) IMPLANT
ELECT REM PT RETURN 9FT ADLT (ELECTROSURGICAL) ×4
ELECTRODE REM PT RTRN 9FT ADLT (ELECTROSURGICAL) ×3 IMPLANT
FORCEPS BIOP RJ4 1.8 (CUTTING FORCEPS) IMPLANT
GLOVE BIO SURGEON STRL SZ 6 (GLOVE) ×4 IMPLANT
GLOVE BIOGEL PI IND STRL 6.5 (GLOVE) ×9 IMPLANT
GLOVE BIOGEL PI INDICATOR 6.5 (GLOVE) ×3
GLOVE EUDERMIC 7 POWDERFREE (GLOVE) ×8 IMPLANT
GLOVE SURG SS PI 6.0 STRL IVOR (GLOVE) ×4 IMPLANT
GOWN PREVENTION PLUS XLARGE (GOWN DISPOSABLE) ×8 IMPLANT
GOWN STRL NON-REIN LRG LVL3 (GOWN DISPOSABLE) ×12 IMPLANT
HEMOSTAT SURGICEL 2X14 (HEMOSTASIS) ×4 IMPLANT
KIT BASIN OR (CUSTOM PROCEDURE TRAY) ×4 IMPLANT
KIT PORT POWER 9.6FR MRI PREA (Catheter) ×4 IMPLANT
KIT PORT POWER ISP 8FR (Catheter) IMPLANT
KIT POWER CATH 8FR (Catheter) IMPLANT
KIT ROOM TURNOVER OR (KITS) ×4 IMPLANT
MARKER SKIN DUAL TIP RULER LAB (MISCELLANEOUS) IMPLANT
NEEDLE 22X1 1/2 (OR ONLY) (NEEDLE) ×4 IMPLANT
NEEDLE BIOPSY TRANSBRONCH 21G (NEEDLE) IMPLANT
NEEDLE SYS SONOTIP II EBUSTBNA (NEEDLE) IMPLANT
NS IRRIG 1000ML POUR BTL (IV SOLUTION) ×4 IMPLANT
OIL SILICONE PENTAX (PARTS (SERVICE/REPAIRS)) IMPLANT
PACK GENERAL/GYN (CUSTOM PROCEDURE TRAY) ×4 IMPLANT
PACK SURGICAL SETUP 50X90 (CUSTOM PROCEDURE TRAY) IMPLANT
PAD ARMBOARD 7.5X6 YLW CONV (MISCELLANEOUS) ×8 IMPLANT
PENCIL BUTTON HOLSTER BLD 10FT (ELECTRODE) ×4 IMPLANT
SPONGE GAUZE 4X4 12PLY (GAUZE/BANDAGES/DRESSINGS) ×4 IMPLANT
SPONGE INTESTINAL PEANUT (DISPOSABLE) ×8 IMPLANT
SUT SILK 2 0 (SUTURE) ×4
SUT SILK 2 0 SH (SUTURE) ×8 IMPLANT
SUT SILK 2 0 TIES 10X30 (SUTURE) IMPLANT
SUT SILK 2-0 18XBRD TIE 12 (SUTURE) ×3 IMPLANT
SUT VIC AB 2-0 CT1 27 (SUTURE) ×4
SUT VIC AB 2-0 CT1 TAPERPNT 27 (SUTURE) ×3 IMPLANT
SUT VIC AB 3-0 SH 27 (SUTURE) ×8
SUT VIC AB 3-0 SH 27X BRD (SUTURE) ×6 IMPLANT
SUT VIC AB 4-0 PS2 27 (SUTURE) ×12 IMPLANT
SYR 20CC LL (SYRINGE) ×8 IMPLANT
SYR 20ML ECCENTRIC (SYRINGE) IMPLANT
SYR 5ML LUER SLIP (SYRINGE) ×4 IMPLANT
SYR CONTROL 10ML LL (SYRINGE) IMPLANT
SYRINGE 10CC LL (SYRINGE) ×8 IMPLANT
TOWEL OR 17X24 6PK STRL BLUE (TOWEL DISPOSABLE) ×4 IMPLANT
TOWEL OR 17X26 10 PK STRL BLUE (TOWEL DISPOSABLE) ×4 IMPLANT
TRAP SPECIMEN MUCOUS 40CC (MISCELLANEOUS) IMPLANT
TUBE CONNECTING 12X1/4 (SUCTIONS) ×4 IMPLANT
VALVE BIOPSY  SINGLE USE (MISCELLANEOUS)
VALVE BIOPSY SINGLE USE (MISCELLANEOUS) IMPLANT
VALVE SUCTION BRONCHIO DISP (MISCELLANEOUS) IMPLANT
WATER STERILE IRR 1000ML POUR (IV SOLUTION) ×4 IMPLANT

## 2012-04-03 NOTE — H&P (Signed)
                 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 This was treated with wedge resection instead of lobectomy due to her severe emphysema with an FEV1 of 0.94. The final pathology showed this to be a T2 moderately to poorly differentiated adenocarcinoma with tumor extending through the visceral pleura as well as lymphovascular invasion. The resection margin was negative for tumor. Her preoperative PET scan showed no other abnormal hypermetabolic activity. She has been followed by Dr. Mohamed and recently began having right chest wall pain associated with cough and shortness of breath. She was seen by Dr. Young and underwent CT and PET scan which showed evidence of metastatic disease in the right lung with a questionable small liver lesion. MRI of the brain was performed in March 2013 for workup of possible dementia and this was negative for metastasis. She recently underwent CT-guided needle biopsy of the liver lesion which was negative for carcinoma. Plans have apparently been made for her to start chemotherapy next Thursday and it has been requested that we try to obtain tissue to confirm metastatic disease and for mutation testing. She also requires a Port-A-Cath for chemotherapy.  Past Medical History  Diagnosis Date  . Hypertension   . Diabetes mellitus     type 2- metformin  . ADENOCARCINOMA, RIGHT LUNG, UPPER LOBE 02/04/2011  . Depression   . RAS (renal artery stenosis)   . OSA (obstructive sleep apnea)   . AAA (abdominal aortic aneurysm)   . Osteoarthritis   . COPD (chronic obstructive pulmonary disease)   . Hx of colonic polyps   . CAD (coronary artery disease)      Past Surgical History  Procedure Date  . 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 Vesicovaginal fistula closure w/ tah   . Abdominal hysterectomy   . Right video-assisted thoracoscopy with wedge resection of 02/14/11    Dr Breyson Kelm  . Cardiac catheterization 08/30/2009    Dr Thomas Kelley  . Intraoperative transesophageal echocardiography. 08/30/2009    Dr David Joslin    Family History  Problem Relation Age of Onset  . Arthritis    . Hyperlipidemia    . Hypertension    . Depression      Social History History  Substance Use Topics  . Smoking status: Former Smoker -- 2.0 packs/day for 30 years    Types: Cigarettes    Quit date: 11/18/2002  . Smokeless tobacco: Never Used  . Alcohol Use: No    Current Outpatient Prescriptions  Medication Sig Dispense Refill  . Aclidinium Bromide (TUDORZA PRESSAIR IN) Inhale 1 puff into the lungs 2 (two) times daily.      . albuterol (PROAIR HFA) 108 (90 BASE) MCG/ACT inhaler Inhale 2 puffs into the lungs every 6 (six) hours as needed.  1 Inhaler  3  . budesonide-formoterol (SYMBICORT) 160-4.5 MCG/ACT inhaler Inhale 2 puffs into the lungs 2 (two) times daily.  3 Inhaler  3  . cetirizine (ZYRTEC) 10   MG tablet Take 10 mg by mouth daily as needed.       . CYMBALTA 60 MG capsule TAKE ONE CAPSULE BY MOUTH DAILY  30 capsule  11  . dexamethasone (DECADRON) 4 MG tablet Take 4 mg by mouth as needed.      . diltiazem (DILACOR XR) 120 MG 24 hr capsule Take 120 mg by mouth at bedtime.        . ezetimibe (ZETIA) 10 MG tablet Take 10 mg by mouth daily.        . ferrous sulfate 325 (65 FE) MG tablet Take 325 mg by mouth daily with breakfast.        . folic acid (FOLVITE) 1 MG tablet Take 1 tablet (1 mg total) by mouth daily.  30 tablet  3  . guaiFENesin (MUCINEX) 600 MG 12 hr tablet Take 600 mg by mouth as needed.       . lidocaine-prilocaine (EMLA) cream Apply topically as needed. Apply to port  1 hour before chemo appointment  30 g  0  . losartan (COZAAR) 50 MG tablet Take 1 tablet by mouth daily.      . metFORMIN (GLUCOPHAGE) 500 MG tablet Take 1 tablet (500 mg total) by mouth 2 (two) times daily with a meal.  60 tablet  5  . Multiple Vitamin (MULTIVITAMIN) tablet Take 1 tablet by mouth daily.        . Nebivolol HCl (BYSTOLIC) 20 MG TABS Take 1 tablet by mouth daily.        . niacin (NIASPAN) 1000 MG CR tablet Take 1,000 mg by mouth at bedtime.        . prochlorperazine (COMPAZINE) 10 MG tablet Take 1 tablet (10 mg total) by mouth every 6 (six) hours as needed.  30 tablet  0  . Risedronate Sodium (ATELVIA) 35 MG TBEC Take 1 tablet (35 mg total) by mouth once a week.  4 tablet  0  . rosuvastatin (CRESTOR) 20 MG tablet Take 20 mg by mouth daily.        . vitamin C (ASCORBIC ACID) 500 MG tablet Take 500 mg by mouth 2 (two) times daily.        . aspirin 81 MG tablet Take 81 mg by mouth daily.        . clopidogrel (PLAVIX) 75 MG tablet Take 75 mg by mouth daily.          No Known Allergies  Review of Systems  Constitutional: Positive for fatigue. Negative for fever, chills, diaphoresis, activity change, appetite change and unexpected weight change.  HENT: Negative.   Eyes: Negative.   Respiratory: Positive for cough and shortness of breath.        Right lateral chest wall discomfort  Cardiovascular: Negative.   Gastrointestinal: Negative.   Genitourinary: Negative.   Musculoskeletal: Negative.   Neurological:       Some memory loss   Hematological: Negative.   Psychiatric/Behavioral: Negative.     BP 125/70  Pulse 62  Resp 16  Ht 5' 4" (1.626 m)  Wt 203 lb (92.08 kg)  BMI 34.84 kg/m2  SpO2 94% Physical Exam  Constitutional: She is oriented to person, place, and time. She appears well-developed and well-nourished. She appears distressed.  HENT:  Head: Normocephalic and atraumatic.  Mouth/Throat: Oropharynx is clear and moist.  Eyes: Conjunctivae and EOM are normal.  Pupils are equal, round, and reactive to light. No scleral icterus.  Neck: Normal range of motion. Neck supple. No JVD   present. No tracheal deviation present. No thyromegaly present.  Cardiovascular: Normal rate, regular rhythm, normal heart sounds and intact distal pulses.  Exam reveals no gallop and no friction rub.   No murmur heard. Pulmonary/Chest: Effort normal and breath sounds normal. No respiratory distress. She has no wheezes. She has no rales. She exhibits no tenderness.  Abdominal: Soft. Bowel sounds are normal. She exhibits no distension and no mass. There is no tenderness.  Musculoskeletal: Normal range of motion. She exhibits no edema.  Lymphadenopathy:    She has cervical adenopathy.  Neurological: She is alert and oriented to person, place, and time. She has normal strength. No cranial nerve deficit or sensory deficit.  Skin: Skin is warm and dry.  Psychiatric: She has a normal mood and affect.     Diagnostic Tests:  *RADIOLOGY REPORT*   Clinical Data: Subsequent treatment strategy for lung adenocarcinoma.   NUCLEAR MEDICINE PET SKULL BASE TO THIGH   Fasting Blood Glucose:  115   Technique:  17.7 mCi F-18 FDG was injected intravenously. CT data was obtained and used for attenuation correction and anatomic localization only.  (This was not acquired as a diagnostic CT examination.) Additional exam technical data entered on technologist worksheet.   Comparison:  01/21/2011   Findings:   Neck: No hypermetabolic lymph nodes in the neck.   Chest:  Postsurgical scarring noted in the right upper lobe.  A 7 mm pulmonary nodule is seen posteriorly along the major fissure on image 80 which has a maximum SUV of 6.1.  A second 10 mm nodule is seen inferior to this in the right midlung on image 86 which also shows hypermetabolic activity with maximum SUV of 1.7.  Both of these suspicious for pulmonary metastases.   Mild right hilar and right paratracheal and  precarinal lymphadenopathy is now seen which is hypermetabolic, with maximum SUV measuring 28.2.  In addition, there is a new hypermetabolic lymph node in the right cardiophrenic angle measuring 1.3 cm, also consistent with metastatic disease.   Abdomen/Pelvis:  A focal area of hypermetabolic activity is seen in the posterior right hepatic lobe which is subcapsular in location on image 123, and was not seen on previous study.  Noncontrast CT images show a possible subtle low attenuation lesion at this site of the liver, and a liver metastasis cannot be excluded.   No abnormal hypermetabolic activity within the pancreas, adrenal glands, or spleen.  No hypermetabolic lymph nodes in the abdomen or pelvis. Incidentally noted on this exam are multiple tiny gallstones and a nonobstructing right renal calculus.   [place]:  No focal hypermetabolic activity to suggest skeletal metastasis.   IMPRESSION:   1.  Two hypermetabolic right lung nodules, suspicious for pulmonary metastases. 2.  Hypermetabolic metastatic lymphadenopathy in the right hilum, mediastinum, and right cardiophrenic angle. 3.  Question small hypermetabolic lesion in the posterior segment of the right hepatic lobe.  Liver metastasis cannot be excluded. Abdomen MRI without and with contrast is recommended for further evaluation.   Original Report Authenticated By: JOHN A. STAHL, M.D.    Impression:  The PET scan shows probable metastatic disease involving the pretracheal and right hilar lymph nodes as well as 2 lesions in the right lung. I think the best method for biopsy would be mediastinoscopy with removal of the pretracheal lymph nodes. This would give us enough tissue for mutation testing. If these lymph nodes are negative for cancer then we'll plan to perform endobronchial ultrasound biopsy of the hilar lymph nodes,   although we may not obtain enough tissue for mutation testing. I also plan to insert a Port-A-Cath in  the left subclavian vein. I discussed the operative procedure with the patient and her daughter who is a nurse. We discussed alternatives, benefits, and risks including but not limited to bleeding, blood transfusion, injury to mediastinal structures, pneumothorax, and inability to obtain a firm tissue diagnosis. We also discussed the possibility that she could develop infection related to a Port-A-Cath or venous thrombosis. She understands all of this and agrees to proceed.   Plan:  We will plan to perform left subclavian Port-A-Cath as well as mediastinoscopy and possible endobronchial ultrasound either on Friday of this week or Monday next week. Our office will call her to schedule that. 

## 2012-04-03 NOTE — Op Note (Signed)
Veronica Duarte, Veronica Duarte                ACCOUNT NO.:  [id number]  MEDICAL RECORD NO.:  [id number]  LOCATION:  [facility]                         FACILITY:  [facility]  PHYSICIAN:  Evelene Croon, M.D.     DATE OF BIRTH:  01/30/1946  DATE OF PROCEDURE:  04/03/2012 DATE OF DISCHARGE:  04/03/2012                              OPERATIVE REPORT   PREOPERATIVE DIAGNOSIS:  Metastatic adenocarcinoma of the lung.  POSTOPERATIVE DIAGNOSIS:  Metastatic adenocarcinoma of the lung.  OPERATIVE PROCEDURE: 1. Mediastinoscopy with lymph node biopsy. 2. Insertion of left subclavian Port-A-Cath for chemotherapy.  ATTENDING SURGEON:  Evelene Croon, M.D.  ASSISTANT:  None.  ANESTHESIA:  General endotracheal.  CLINICAL HISTORY:  This patient is a 66 year old woman who underwent wedge resection of a right upper lobe adenocarcinoma about 1 year ago. She had wedge resection instead of lobectomy due to her severe COPD with an FEV1 of about 0.9.  She has subsequently developed recurrence with 2 lesions in the right lung that are hypermetabolic on PET scan as well as hypermetabolic lymphadenopathy in the right hilar and lower peritracheal and pretracheal region.  She has been followed by Dr. Arbutus Ped and chemotherapy is planned to start next Thursday.  I was asked to see the patient to establish tissue diagnosis of metastatic carcinoma and to insert a Port-A-Cath.  I discussed the CT scan findings with the patient and her daughter and felt that we would be best to perform mediastinoscopy and remove the paratracheal and pretracheal lymph nodes. If that did not to give [place] diagnosis then perform an endobronchial ultrasound with biopsy of these lymph nodes as well as the hilar lymph nodes.  I also discussed insertion of left subclavian Port-A-Cath.  We discussed alternatives, benefits, and risks including, but not limited to, bleeding, injury to mediastinal structures, infection, subclavian vein thrombosis, and inability to  establish a firm tissue diagnosis by any of these methods.  She understood all of this and agreed to proceed.  OPERATIVE PROCEDURE:  The patient was seen in preoperative holding area. Proper patient, proper operation, proper operative site were confirmed after reviewing the scans.  We decided to place a left subclavian Port-A- Cath for cancers on the right side.  The left side of the chest was signed by me.  She was given preoperative intravenous antibiotics and taken back to the operating room.  After induction of general endotracheal anesthesia, a roll was placed beneath the shoulders and the neck extended.  Then the neck and chest were prepped with Betadine soap and solution, draped in usual sterile manner.  A 2-cm curvilinear incision was placed in the lower neck just above the sternal notch and carried down through the subcutaneous tissue using electrocautery.  The strap muscles were separated in the midline and were retracted laterally to expose the trachea.  Pretracheal plane was developed bluntly.  The mediastinoscope was inserted.  The right lower paratracheal lymph nodes were identified.  These lymph nodes were removed and sent to Pathology. 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  No further biopsies were taken.  Hemostasis was achieved without difficulty.  Then the strap muscles were reapproximated in the midline using interrupted  3-0 Vicryl suture.  The subcutaneous tissue and platysma muscles reapproximated with interrupted 3-0 Vicryl suture and the skin with a 4-0 Vicryl subcuticular skin stitch.  Dermabond placed over this incision.  The sponge, needle, instrument counts were correct according to the scrub nurse.  Then, attention was turned to a Port-A-Cath placement.  We had already prepped and draped the right subclavian area and chest wall for the mediastinoscopy and therefore, the bed was just turned to 90-degree angle.  Then, a 2-cm  incision was made in the left infraclavicular region and carried down through the subcutaneous tissue using electrocautery.  A subcutaneous pocket was developed inferior to the incision just anterior to the pectoral fascia.  Then, the left subclavian vein was cannulated with a needle and a guidewire advanced to the right side of the heart under fluoroscopic guidance.  The needle was removed and a peel-away introducer and sheath was inserted over the guidewire and advanced under fluoroscopic guidance.  The introducer and wire were removed, and the 9.6-French single-lumen power port was then inserted through the sheath under fluoroscopic guidance and the peel- away sheath removed.  The catheter had good blood return and was flushed with heparin saline solution.  It was fixed to the pectoral fascia with interrupted 2-0 Vicryl sutures.  The catheter and port were then flushed with a final flush of with heparin solution at 1000 units/mL and used about 3 mL of this.  Hemostasis was complete.  Then, the subcutaneous tissue was closed with continuous 3-0 Vicryl suture and the skin with a 4-0 Vicryl subcuticular skin stitch.  Dermabond was placed over this incision.  The sponge, needle, and instrument counts were correct according to the scrub nurse.  The patient was awakened, extubated, and transferred to the Post-Anesthesia Care Unit in satisfactory and stable condition.     Evelene Croon, M.D.     BB/MEDQ  D:  04/03/2012  T:  04/03/2012  Job:  [id number]

## 2012-04-03 NOTE — Preoperative (Signed)
Beta Blockers   Reason not to administer Beta Blockers:Not Applicable 

## 2012-04-03 NOTE — Anesthesia Preprocedure Evaluation (Addendum)
Anesthesia Evaluation  Patient identified by MRN, date of birth, ID band Patient awake    Reviewed: Allergy & Precautions, H&P , NPO status , Patient's Chart, lab work & pertinent test results  Airway       Dental  (+) Teeth Intact and Dental Advidsory Given   Pulmonary sleep apnea , COPD COPD inhaler,  breath sounds clear to auscultation        Cardiovascular hypertension, Pt. on home beta blockers + CAD Rhythm:Regular Rate:Normal     Neuro/Psych PSYCHIATRIC DISORDERS Depression    GI/Hepatic   Endo/Other  Diabetes mellitus-  Renal/GU      Musculoskeletal   Abdominal   Peds  Hematology   Anesthesia Other Findings   Reproductive/Obstetrics                          Anesthesia Physical Anesthesia Plan  ASA: III  Anesthesia Plan: General   Post-op Pain Management:    Induction: Intravenous  Airway Management Planned: Oral ETT  Additional Equipment:   Intra-op Plan:   Post-operative Plan: Extubation in OR  Informed Consent: I have reviewed the patients History and Physical, chart, labs and discussed the procedure including the risks, benefits and alternatives for the proposed anesthesia with the patient or authorized representative who has indicated his/her understanding and acceptance.   Dental advisory given and Dental Advisory Given  Plan Discussed with: Anesthesiologist, CRNA and Surgeon  Anesthesia Plan Comments: (H/O adeno ca R. Upper Lung CAD S/P CABG 10/10 (-) Cardiolite  10/04/10 Type 2 DM CBG 131 Htn COPD  Plan GA  Kipp Brood)       Anesthesia Quick Evaluation

## 2012-04-03 NOTE — OR Nursing (Signed)
Procedure 1 Medianoscopy start at 1249 and end at 1337. Procedure 2 Port a Cath insertion start at 1345 and end at 32.

## 2012-04-03 NOTE — Brief Op Note (Addendum)
04/03/2012  2:14 PM  PATIENT:  Towanda Malkin  66 y.o. female  PRE-OPERATIVE DIAGNOSIS:  Lung Cancer  POST-OPERATIVE DIAGNOSIS:  Lung Cancer  PROCEDURE:  Procedure(s) (LRB): MEDIASTINOSCOPY (N/A) INSERTION PORT-A-CATH (Left)  SURGEON:  Surgeon(s) and Role:    * Alleen Borne, MD - Primary  PHYSICIAN ASSISTANT: none  ASSISTANTS: none   ANESTHESIA:   general  EBL:  Total I/O In: 1300 [I.V.:1300] Out: 100 [Blood:100]  BLOOD ADMINISTERED:none  DRAINS: none   LOCAL MEDICATIONS USED:  NONE  SPECIMEN:  Source of Specimen:  4R lymph node  DISPOSITION OF SPECIMEN:  PATHOLOGY  COUNTS:  YES  PLAN OF CARE: Discharge to home after PACU  PATIENT DISPOSITION:  PACU - hemodynamically stable.   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

## 2012-04-03 NOTE — Progress Notes (Signed)
Dr. Laneta Simmers notified of UA results. No further orders received.

## 2012-04-03 NOTE — Transfer of Care (Signed)
Immediate Anesthesia Transfer of Care Note  Patient: Veronica Duarte  Procedure(s) Performed: Procedure(s) (LRB): MEDIASTINOSCOPY (N/A) INSERTION PORT-A-CATH (Left)  Patient Location: PACU  Anesthesia Type: General  Level of Consciousness: awake, alert  and oriented  Airway & Oxygen Therapy: Patient Spontanous Breathing and Patient connected to face mask oxygen  Post-op Assessment: Report given to PACU RN, Post -op Vital signs reviewed and stable and Patient moving all extremities  Post vital signs: Reviewed and stable  Complications: No apparent anesthesia complications

## 2012-04-03 NOTE — Interval H&P Note (Signed)
History and Physical Interval Note:  04/03/2012 11:43 AM  Veronica Duarte  has presented today for surgery, with the diagnosis of Ca lung  The various methods of treatment have been discussed with the patient and family. After consideration of risks, benefits and other options for treatment, the patient has consented to  Procedure(s) (LRB): MEDIASTINOSCOPY (N/A) VIDEO BRONCHOSCOPY WITH ENDOBRONCHIAL ULTRASOUND (N/A) INSERTION PORT-A-CATH (Left) as a surgical intervention .  The patients' history has been reviewed, patient examined, no change in status, stable for surgery.  I have reviewed the patients' chart and labs.  Questions were answered to the patient's satisfaction.     Alleen Borne

## 2012-04-03 NOTE — OR Nursing (Signed)
Items wasted in supplies for EBUS procedure were open but not used.

## 2012-04-03 NOTE — Anesthesia Procedure Notes (Signed)
Procedure Name: Intubation Date/Time: 04/03/2012 12:20 PM Performed by: Carmela Rima Pre-anesthesia Checklist: Timeout performed, Patient identified, Emergency Drugs available, Suction available and Patient being monitored Patient Re-evaluated:Patient Re-evaluated prior to inductionOxygen Delivery Method: Circle system utilized Preoxygenation: Pre-oxygenation with 100% oxygen Intubation Type: IV induction Ventilation: Mask ventilation without difficulty Laryngoscope Size: Mac and 4 Grade View: Grade I Tube type: Oral Tube size: 7.5 mm Number of attempts: 1 Placement Confirmation: ETT inserted through vocal cords under direct vision,  breath sounds checked- equal and bilateral and positive ETCO2 Secured at: 22 cm Tube secured with: Tape Dental Injury: Teeth and Oropharynx as per pre-operative assessment

## 2012-04-03 NOTE — Discharge Instructions (Signed)
You may shower.  Incisions are coated with Dermabond surgical adhesive.  Do not apply creams or lotions to incisions.  Resume normal activity as tolerated.Cancer, General Information Cancer is a group of many related diseases that begin in cells. Cells are the building blocks of the body. Cells are also the basic unit of life. The body is made up of many types of cells. Normally, cells grow and divide to produce more cells only when the body needs them. This orderly process helps keep the body healthy. Sometimes cells keep dividing when new cells are not needed. These extra cells may form a mass of tissue. It is called a growth or tumor. Tumors can be either:  Not cancerous (benign).   Cancerous (malignant).  Cancer can begin in any organ or tissue of the body. The original tumor is where the tumor started out. It is called the primary cancer or primary tumor. Usually it is named for the part of the body in which it begins. Metastases mean the spread of cancer. Cancer cells can break away from a primary tumor and travel locally. These grow next to the tumor. Cancer cells can also travel through the bloodstream or lymphatic system to other parts of the body. Cancer cells may spread to lymph nodes near the primary tumor regional lymph nodes, so called because they are in the region. This is called:  Nodal involvement.   Positive nodes.   Regional disease.  Cancer cells can also spread to other parts of the body, distant from the primary tumor. Doctors use the term metastatic disease or distant disease to describe cancer that spreads to other organs or to lymph nodes other than those near the primary tumor. When cancer cells spread and form a new tumor, the new tumor is called a secondary, or metastatic, tumor. The cancer cells that form the secondary tumor are like those in the original tumor. That means, for example, that if breast cancer spreads (metastasizes) to the lung, the secondary tumor is  made up of abnormal breast cells. It is not made of abnormal lung cells. The disease in the lung is metastatic breast cancer. It is not lung cancer. A metastasis is a tumor that started from a cancer cell or cells in another part of the body. But sometimes a primary cancer is discovered only after a metastasis causes problems (symptoms). For example, a man whose prostate cancer has spread to the bones in the pelvis may have lower back pain (caused by the cancer in his bones) before experiencing any symptoms from the prostate tumor itself. The cells in a metastatic tumor resemble those in the primary tumor. The cancerous tissue is examined under a microscope to determine the cell type. Then a doctor can usually tell whether that type of cell is normally found in the part of the body from which the tissue sample was taken. For instance, breast cancer cells look the same whether they are found in the breast or have spread to another part of the body. So, if a tissue sample taken from a tumor in the lung contains cells that look like breast cells, the doctor determines that the lung tumor is a secondary tumor. Metastatic cancers may be found at the same time as the primary tumor, or months or years later. When a second tumor is found in a patient who has been treated for cancer in the past, it is more often a metastasis than another primary tumor. In a small number of cancer  patients, a secondary tumor is diagnosed, but no primary cancer can be found, in spite of extensive tests. Doctors refer to the primary tumor as unknown or occult. The patient is said tohave cancer of unknown primary origin (CUP). This means we do not know where the tumor came from. TREATMENT  When cancer has metastasized, it may be treated with:  Chemotherapy.   Radiation therapy.   Biologic therapy.   Hormone therapy.   Surgery.   A combination of these.  The choice of treatment generally depends on the:  Type of primary  cancer.   Size and location of the metastasis.   Patient's age and general health.   Types of treatments used previously.  In patients diagnosed with CUP, it is still possible to treat the disease even when the primary tumor cannot be located. It is also possible to treat the cancer even if the organ from which the cancer cells came cannot be identified. New cancer treatments are currently under study. To develop new treatments, the [employer] ([employer]) sponsors clinical trials (research studies) with cancer patients in many hospitals, universities, medical schools, and cancer centers around the country. Clinical trials are a critical step in the improvement of treatment. Before any new treatment can be recommended for general use, doctors conduct studies to find out whether the treatment is both safe for patients and effective against the disease. The results of such studies have led to progress not only in the treatment of cancer, but in the detection, diagnosis, improvement of survival rates, and prevention of the disease. Patients interested in participating in research should ask their doctor to find out whether they are eligible for a clinical trial. FOR MORE INFORMATION http://www.cancer.gov Document Released: 11/04/2005 Document Revised: 10/24/2011 Document Reviewed: 07/23/2007 [facility] Patient Information 2012 [place], [place].Thoracotomy Thoracotomy is surgery on the chest using a large cut (incision) to get access to the organs inside. This type of surgery is often used to repair or remove diseased or damaged tissue in the chest. LET YOUR CAREGIVER KNOW ABOUT:  Allergies to food or medicine.   Medicines taken, including vitamins, herbs, eyedrops, over-the-counter medicines, and creams.   Use of steroids (by mouth or creams).   Previous problems with anesthetics or numbing medicines.   History of bleeding problems or blood clots.   Previous surgery.   Other health  problems, including diabetes and kidney problems.   Possibility of pregnancy, if this applies.  RISKS AND COMPLICATIONS  Possible complications of general thoracic surgery include:   Respiratory failure.   Severe bleeding (hemorrhage).   Nerve injury.   Abnormal heart rhythm (arrhythmia).   Heart attack.   Blocked blood vessel (embolism).   Infection.   The chest tubes used for drainage after thoracic surgery may cause a buildup of fluid or air in the area around the lungs (pleural space). Both of these conditions can lead to lung collapse, trouble breathing, or other problems.  BEFORE THE PROCEDURE  If you are not having emergency surgery, a complete medical history and exam will be done before surgery.   If you are a smoker, quit. This improves recovery and decreases complications.   Other tests may be done before the surgery. Your surgeon can explain these tests to you. Tests may include:   X-rays.   MRI.   Sputum culture.   CT scans.   Blood gas analysis.   Lung (pulmonary) function tests.   Electrocardiography.   Endoscopy.   Pulmonary angiography.   Do  not eat for 10 to 12 hours prior to the surgery, or as directed by your surgeon.  PROCEDURE  You may be given medicine to help you relax (sedative) before surgery. An intravenous (IV) access is inserted into your arm or neck to give fluids and medicine. You will be given medicine that makes you sleep (general anesthetic). A tube is placed down your throat, into your trachea for the procedure. The chest is cut open to expose the chest cavity. The incision will be made by your surgeon over the area of the chest which will expose the problem best. The procedure differs depending on what is wrong and what needs to be done. Following the procedure, a chest tube is inserted between the ribs to drain the wound and re-expand the collapsed lung. AFTER THE PROCEDURE  You will be taken to a recovery room and watched  closely. Depending on the procedure performed, your breathing tube may be removed, or you may need to stay in the intensive care unit (ICU) with the breathing tube still in place. Ask your caregiver what to expect.   Patients usually have moderate to severe pain following this surgery. Pain medicines will be given to keep you comfortable.   Chest tubes are watched closely for signs of fluid or air buildup in the lungs. Your caregiver will remove them when it is safe.   During the recovery period, respiratory therapists and nurses will work with you on deep breathing and coughing exercises to improve lung function.  Document Released: 08/03/2003 Document Revised: 10/24/2011 Document Reviewed: 04/19/2011 [facility] Patient Information 2012 [place], [place].

## 2012-04-05 LAB — URINE CULTURE
Colony Count: >=100000
Culture  Setup Time: 201305170958

## 2012-04-06 ENCOUNTER — Encounter (HOSPITAL_COMMUNITY): Payer: Self-pay | Admitting: Surgery

## 2012-04-06 ENCOUNTER — Ambulatory Visit: Payer: Medicare Other

## 2012-04-06 ENCOUNTER — Ambulatory Visit: Payer: Medicare Other | Admitting: Internal Medicine

## 2012-04-08 ENCOUNTER — Ambulatory Visit (INDEPENDENT_AMBULATORY_CARE_PROVIDER_SITE_OTHER): Payer: Medicare Other

## 2012-04-08 ENCOUNTER — Ambulatory Visit: Payer: Medicare Other | Admitting: Internal Medicine

## 2012-04-08 ENCOUNTER — Other Ambulatory Visit: Payer: Medicare Other | Admitting: Lab

## 2012-04-08 VITALS — BP 134/74 | HR 72 | Temp 98.8°F | Resp 20

## 2012-04-08 DIAGNOSIS — Z09 Encounter for follow-up examination after completed treatment for conditions other than malignant neoplasm: Secondary | ICD-10-CM

## 2012-04-08 DIAGNOSIS — C3490 Malignant neoplasm of unspecified part of unspecified bronchus or lung: Secondary | ICD-10-CM

## 2012-04-08 DIAGNOSIS — I2510 Atherosclerotic heart disease of native coronary artery without angina pectoris: Secondary | ICD-10-CM

## 2012-04-08 NOTE — Progress Notes (Unsigned)
Dr. Laneta Simmers examined Veronica Duarte and reassured her that the operative site was progressing as it should. The area of residual bruising was normal and there was minimal swelling.  Her sore throat was due to intubation.  Warm salt water gargles will help or Chloraseptic spray.

## 2012-04-09 ENCOUNTER — Other Ambulatory Visit (HOSPITAL_BASED_OUTPATIENT_CLINIC_OR_DEPARTMENT_OTHER): Payer: Medicare Other | Admitting: Lab

## 2012-04-09 ENCOUNTER — Other Ambulatory Visit: Payer: Medicare Other

## 2012-04-09 ENCOUNTER — Encounter (HOSPITAL_COMMUNITY): Payer: Self-pay

## 2012-04-09 ENCOUNTER — Ambulatory Visit (HOSPITAL_BASED_OUTPATIENT_CLINIC_OR_DEPARTMENT_OTHER): Payer: Medicare Other

## 2012-04-09 ENCOUNTER — Other Ambulatory Visit: Payer: Self-pay | Admitting: Internal Medicine

## 2012-04-09 VITALS — BP 127/75 | HR 77 | Temp 98.4°F

## 2012-04-09 DIAGNOSIS — C3410 Malignant neoplasm of upper lobe, unspecified bronchus or lung: Secondary | ICD-10-CM

## 2012-04-09 DIAGNOSIS — Z5111 Encounter for antineoplastic chemotherapy: Secondary | ICD-10-CM

## 2012-04-09 LAB — CBC WITH DIFFERENTIAL/PLATELET
BASO%: 0.1 % (ref 0.0–2.0)
Eosinophils Absolute: 0 10*3/uL (ref 0.0–0.5)
LYMPH%: 9.6 % — ABNORMAL LOW (ref 14.0–49.7)
MCHC: 33.9 g/dL (ref 31.5–36.0)
MONO#: 0.3 10*3/uL (ref 0.1–0.9)
NEUT#: 9.2 10*3/uL — ABNORMAL HIGH (ref 1.5–6.5)
RBC: 4.58 10*6/uL (ref 3.70–5.45)
RDW: 13.3 % (ref 11.2–14.5)
WBC: 10.5 10*3/uL — ABNORMAL HIGH (ref 3.9–10.3)
lymph#: 1 10*3/uL (ref 0.9–3.3)
nRBC: 0 % (ref 0–0)

## 2012-04-09 LAB — COMPREHENSIVE METABOLIC PANEL
ALT: 14 U/L (ref 0–35)
AST: 15 U/L (ref 0–37)
CO2: 22 mEq/L (ref 19–32)
Calcium: 10.1 mg/dL (ref 8.4–10.5)
Chloride: 102 mEq/L (ref 96–112)
Creatinine, Ser: 0.7 mg/dL (ref 0.50–1.10)
Sodium: 140 mEq/L (ref 135–145)
Total Protein: 6.8 g/dL (ref 6.0–8.3)

## 2012-04-09 MED ORDER — SODIUM CHLORIDE 0.9 % IV SOLN
500.0000 mg/m2 | Freq: Once | INTRAVENOUS | Status: AC
  Administered 2012-04-09: 1025 mg via INTRAVENOUS
  Filled 2012-04-09: qty 41

## 2012-04-09 MED ORDER — SODIUM CHLORIDE 0.9 % IV SOLN
Freq: Once | INTRAVENOUS | Status: AC
  Administered 2012-04-09: 15:00:00 via INTRAVENOUS

## 2012-04-09 MED ORDER — HEPARIN SOD (PORK) LOCK FLUSH 100 UNIT/ML IV SOLN
500.0000 [IU] | Freq: Once | INTRAVENOUS | Status: AC | PRN
  Administered 2012-04-09: 500 [IU]
  Filled 2012-04-09: qty 5

## 2012-04-09 MED ORDER — ONDANSETRON 16 MG/50ML IVPB (CHCC)
16.0000 mg | Freq: Once | INTRAVENOUS | Status: AC
  Administered 2012-04-09: 16 mg via INTRAVENOUS

## 2012-04-09 MED ORDER — DEXAMETHASONE SODIUM PHOSPHATE 4 MG/ML IJ SOLN
20.0000 mg | Freq: Once | INTRAMUSCULAR | Status: AC
  Administered 2012-04-09: 20 mg via INTRAVENOUS

## 2012-04-09 MED ORDER — SODIUM CHLORIDE 0.9 % IJ SOLN
10.0000 mL | INTRAMUSCULAR | Status: DC | PRN
  Administered 2012-04-09: 10 mL
  Filled 2012-04-09: qty 10

## 2012-04-09 MED ORDER — SODIUM CHLORIDE 0.9 % IV SOLN
685.5000 mg | Freq: Once | INTRAVENOUS | Status: AC
  Administered 2012-04-09: 690 mg via INTRAVENOUS
  Filled 2012-04-09: qty 69

## 2012-04-09 NOTE — Patient Instructions (Signed)
[facility] Discharge Instructions for Patients Receiving Chemotherapy  Today you received the following chemotherapy agents Alimta/Carboplatin  To help prevent nausea and vomiting after your treatment, we encourage you to take your nausea medication Compazine discontinued per med list.  Instructed to call if any nausea.    If you develop nausea and vomiting that is not controlled by your nausea medication, call the clinic. If it is after clinic hours your family physician or the after hours number for the clinic or go to the Emergency Department.   BELOW ARE SYMPTOMS THAT SHOULD BE REPORTED IMMEDIATELY:  *FEVER GREATER THAN 100.5 F  *CHILLS WITH OR WITHOUT FEVER  NAUSEA AND VOMITING THAT IS NOT CONTROLLED WITH YOUR NAUSEA MEDICATION  *UNUSUAL SHORTNESS OF BREATH  *UNUSUAL BRUISING OR BLEEDING  TENDERNESS IN MOUTH AND THROAT WITH OR WITHOUT PRESENCE OF ULCERS  *URINARY PROBLEMS  *BOWEL PROBLEMS  UNUSUAL RASH Items with * indicate a potential emergency and should be followed up as soon as possible.  One of the nurses will contact you 24 hours after your treatment. Please let the nurse know about any problems that you may have experienced. Feel free to call the clinic you have any questions or concerns. The clinic phone number is [phone].   I have been informed and understand all the instructions given to me. I know to contact the clinic, my physician, or go to the Emergency Department if any problems should occur. I do not have any questions at this time, but understand that I may call the clinic during office hours   should I have any questions or need assistance in obtaining follow up care.    __________________________________________  _____________  __________ Signature of Patient or Authorized Representative            Date                   Time    __________________________________________ Nurse's Signature

## 2012-04-10 ENCOUNTER — Telehealth: Payer: Self-pay | Admitting: *Deleted

## 2012-04-10 NOTE — Telephone Encounter (Signed)
No adverse event from chemo tx

## 2012-04-14 ENCOUNTER — Encounter: Payer: Self-pay | Admitting: Oncology

## 2012-04-14 NOTE — Progress Notes (Signed)
Patient having dysuria. Urine culture from 04/03/2012 showed greater than 100,000 col/ml of E.coli--multi sensitive.  I called in a script for Keflex 500 mg 2x daily to [facility] in [place], [phone].  I spoke to patient's daughter, Natalia Leatherwood.    Samul Dada 04/14/2012 9:05 PM

## 2012-04-15 ENCOUNTER — Encounter: Payer: Self-pay | Admitting: *Deleted

## 2012-04-15 ENCOUNTER — Telehealth: Payer: Self-pay | Admitting: *Deleted

## 2012-04-15 ENCOUNTER — Other Ambulatory Visit: Payer: Self-pay | Admitting: *Deleted

## 2012-04-15 ENCOUNTER — Ambulatory Visit (HOSPITAL_BASED_OUTPATIENT_CLINIC_OR_DEPARTMENT_OTHER): Payer: Medicare Other

## 2012-04-15 DIAGNOSIS — C3410 Malignant neoplasm of upper lobe, unspecified bronchus or lung: Secondary | ICD-10-CM

## 2012-04-15 DIAGNOSIS — R197 Diarrhea, unspecified: Secondary | ICD-10-CM

## 2012-04-15 MED ORDER — SODIUM CHLORIDE 0.9 % IV SOLN
INTRAVENOUS | Status: DC
  Administered 2012-04-15: 12:00:00 via INTRAVENOUS

## 2012-04-15 NOTE — Telephone Encounter (Signed)
Verbal order received and read back from Dr. Arbutus Ped for pt. to come in for IVF today.  Patient added to our add-on wait list for today.  No openings at [facility] or [employer] [facility] [employer] today or tomorrow per [place]. Called daughter [name] with this information.  Instructed to take mom to ER if she is in distress and not to give mom her b/p medications due to low b/p and her dizziness.  [name] says mom does not need to go to the ER.  Asked if she should awaken her and what to do while she waits.  Instructed to awaken mom and offer food and beverages and to be sure she is helping her when she ambulates to the bathroom.

## 2012-04-15 NOTE — Telephone Encounter (Signed)
Daughter [name] called reporting mom's b/p - 70 systolic.  Last night she had a loose stool before starting the keflex.  C/O feeling dizzy and stopped up ears.  [name] helped her upstairs, mom couldn't make it and had to sit down.  This is when I checked her b/p.  Once in bed, with feet elevated b/p = 90 systolic.  She had another diarrhea stool and b/p = 72/60.  She has remained in bed with feet elevated and b/p will stay in the 90's.  She has not taken any b/p meds today.  Last bystolic, diltiazem and losartan were taken yesterday.  She is drinking and eating well.  Goes to the bathroom often either from Water intake or the UTI.  Will notify providers.

## 2012-04-15 NOTE — Progress Notes (Signed)
[facility] Brief Psychosocial Assessment Clinical Social Work  Clinical Social Work received call from patient's daughter, Veronica Duarte, requesting support and resources.  The patient's daughter states she is the primary caregiver and patient has recently moved in with her and her family.  She states Veronica Duarte needs assistance with all ADL's and reports her first round of chemo has been very difficult for them.  Veronica Duarte states she is very overwhelmed juggling caring for her mother and her children.  She states her mother's physical and cognitive abilities have declined since beginning her first round of chemotherapy treatment.  CSW validated Veronica Duarte feelings and counseled her on caregiver resilience.  CSW provided her with information on [employer]/Road to Recovery, Lawyer, and home care resources.  The patient's daughter questioned whether Hospice was appropriate.  CSW stated she would need to discuss this with the patient's medical oncologist.  CSW provided brief Hospice education and stated they may contact Hospice to better understand all resources, however, they would need to discuss this further with their MD.  CSW encouraged patient to discuss medical needs with the medical oncologist and contact CSW as support/resource needs arise.  Veronica Duarte, MSW, [facility] Clinical Social Worker [facility] [phone]

## 2012-04-15 NOTE — Telephone Encounter (Signed)
Veronica Duarte added patient on for 04-15-2012 aware patient of the appointment

## 2012-04-16 ENCOUNTER — Encounter: Payer: Self-pay | Admitting: *Deleted

## 2012-04-16 ENCOUNTER — Ambulatory Visit (HOSPITAL_BASED_OUTPATIENT_CLINIC_OR_DEPARTMENT_OTHER): Payer: Medicare Other | Admitting: Internal Medicine

## 2012-04-16 ENCOUNTER — Other Ambulatory Visit (HOSPITAL_BASED_OUTPATIENT_CLINIC_OR_DEPARTMENT_OTHER): Payer: Medicare Other | Admitting: Lab

## 2012-04-16 ENCOUNTER — Telehealth: Payer: Self-pay | Admitting: Internal Medicine

## 2012-04-16 VITALS — BP 96/55 | HR 78 | Temp 97.8°F | Ht 64.0 in | Wt 191.9 lb

## 2012-04-16 DIAGNOSIS — C3490 Malignant neoplasm of unspecified part of unspecified bronchus or lung: Secondary | ICD-10-CM

## 2012-04-16 DIAGNOSIS — C3410 Malignant neoplasm of upper lobe, unspecified bronchus or lung: Secondary | ICD-10-CM

## 2012-04-16 LAB — CBC WITH DIFFERENTIAL/PLATELET
BASO%: 0.2 % (ref 0.0–2.0)
EOS%: 1 % (ref 0.0–7.0)
HCT: 40.2 % (ref 34.8–46.6)
LYMPH%: 22.4 % (ref 14.0–49.7)
MCH: 28.3 pg (ref 25.1–34.0)
MCHC: 34.2 g/dL (ref 31.5–36.0)
NEUT%: 73.1 % (ref 38.4–76.8)
Platelets: 176 10*3/uL (ref 145–400)
RBC: 4.85 10*6/uL (ref 3.70–5.45)
WBC: 6.9 10*3/uL (ref 3.9–10.3)
lymph#: 1.6 10*3/uL (ref 0.9–3.3)
nRBC: 0 % (ref 0–0)

## 2012-04-16 LAB — COMPREHENSIVE METABOLIC PANEL
ALT: 24 U/L (ref 0–35)
AST: 26 U/L (ref 0–37)
Alkaline Phosphatase: 49 U/L (ref 39–117)
BUN: 42 mg/dL — ABNORMAL HIGH (ref 6–23)
Creatinine, Ser: 2.48 mg/dL — ABNORMAL HIGH (ref 0.50–1.10)

## 2012-04-16 NOTE — Progress Notes (Signed)
Per Dr Donnald Garre, pt can have [facility] do a PT/OT evaluate and treat due to increased weakness and decreased ADL's.  Orders signed and faxed to [facility].  SLJ

## 2012-04-16 NOTE — Progress Notes (Signed)
[facility] [phone]   [phone]  OFFICE PROGRESS NOTE  [name], MD, MD [address] [facility] [address] [place], [address] [place] [address]  PRINCIPAL DIAGNOSIS: Metastatic non-small cell lung cancer, adenocarcinoma initially diagnosed as stage IB (T2a, N0, N0) in March 2012.   PRIOR THERAPY: Status post wedge resection of the right upper lobe under the care of Dr. Edwyna Shell on February 14, 2011.   CURRENT THERAPY: Systemic chemotherapy with carboplatin for AUC of 5 and Alimta 500 mg/M2 every 3 weeks, the patient is status post 1 cycle.   INTERVAL HISTORY: Veronica Duarte 66 y.o. female returns to the clinic today for followup visit accompanied by her daughter and sister. The patient was started the first cycle of her systemic chemotherapy with carboplatin and Alimta last week. Her daughter mentions that her mother has been complaining of increasing fatigue and weakness over the last several days in addition to hypotension with systolic in the range of 70s. She received IV normal saline hydration yesterday. She also has poor appetite and lost around 11 pounds since her last visit. She denied having any significant fever or chills. She has no nausea or vomiting. No significant chest pain but continues to have shortness breath with exertion. Her daughter is looking for some assistance to help with her mother at home.  The patient was seen by Dr. Laneta Simmers on 04/03/2012 and underwent mediastinoscopy with lymph node biopsy that was consistent with adenocarcinoma. The tissue blocks were sent for EGFR mutation as well as ALK gene translocation. These results still pending.  MEDICAL HISTORY: Past Medical History  Diagnosis Date  . Diabetes mellitus     type 2- metformin  . ADENOCARCINOMA, RIGHT LUNG, UPPER LOBE 02/04/2011  . Depression   . RAS (renal artery stenosis)   . AAA (abdominal aortic aneurysm)   . Osteoarthritis   . COPD (chronic obstructive  pulmonary disease)   . Hx of colonic polyps   . CAD (coronary artery disease)   . OSA (obstructive sleep apnea)     Uses CPAP pressure is 6 study was done > 5 years  . Hypertension     Sees Dr. Tresa Endo with [facility]    ALLERGIES:   has no known allergies.  MEDICATIONS:  Current Outpatient Prescriptions  Medication Sig Dispense Refill  . Aclidinium Bromide (TUDORZA PRESSAIR IN) Inhale 1 puff into the lungs 2 (two) times daily.      Marland Kitchen albuterol (PROVENTIL HFA;VENTOLIN HFA) 108 (90 BASE) MCG/ACT inhaler Inhale 2 puffs into the lungs every 6 (six) hours as needed. For shortness of breath      . budesonide-formoterol (SYMBICORT) 160-4.5 MCG/ACT inhaler Inhale 2 puffs into the lungs 2 (two) times daily.      . Cephalexin (KEFLEX PO) Take by mouth. MD on call put her on keflex, unsure of dose      . cetirizine (ZYRTEC) 10 MG tablet Take 10 mg by mouth daily as needed. For allergies      . dexamethasone (DECADRON) 4 MG tablet Take 4 mg by mouth as directed. 1 tab BID day before of and after chemo      . DULoxetine (CYMBALTA) 60 MG capsule Take 60 mg by mouth daily.      Marland Kitchen ezetimibe (ZETIA) 10 MG tablet Take 10 mg by mouth daily.       . ferrous sulfate 325 (65 FE) MG tablet Take 325 mg by mouth daily with breakfast.       .  folic acid (FOLVITE) 1 MG tablet Take 1 mg by mouth daily.      Marland Kitchen guaiFENesin (MUCINEX) 600 MG 12 hr tablet Take 600 mg by mouth 2 (two) times daily as needed. For congestion      . lidocaine-prilocaine (EMLA) cream Apply 1 application topically as needed. Apply to port 1 hour before chemo appointment      . MECLIZINE HCL PO Take 25 mg by mouth as needed.      . metFORMIN (GLUCOPHAGE) 500 MG tablet Take 500 mg by mouth 2 (two) times daily with a meal.      . Multiple Vitamin (MULTIVITAMIN) tablet Take 1 tablet by mouth daily.       . niacin (NIASPAN) 1000 MG CR tablet Take 1,000 mg by mouth at bedtime.       Marland Kitchen Phenazopyridine HCl (PYRIDIUM PO) Take by mouth. OTX AZO Standard        . prochlorperazine (COMPAZINE) 10 MG tablet Take 10 mg by mouth every 6 (six) hours as needed. For nausea      . Risedronate Sodium 35 MG TBEC Take 1 tablet by mouth once a week. Sundays      . rosuvastatin (CRESTOR) 20 MG tablet Take 20 mg by mouth daily.       . vitamin C (ASCORBIC ACID) 500 MG tablet Take 500 mg by mouth 2 (two) times daily.       Marland Kitchen aspirin 81 MG tablet Take 81 mg by mouth daily.       . clopidogrel (PLAVIX) 75 MG tablet Take 75 mg by mouth daily.       Marland Kitchen diltiazem (DILACOR XR) 120 MG 24 hr capsule Take 120 mg by mouth at bedtime.       Marland Kitchen losartan (COZAAR) 50 MG tablet Take 1 tablet by mouth daily.      . Nebivolol HCl (BYSTOLIC) 20 MG TABS Take 1 tablet by mouth daily.         SURGICAL HISTORY:  Past Surgical History  Procedure Date  . 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 Vesicovaginal fistula closure w/ tah   . Abdominal hysterectomy   . Right video-assisted thoracoscopy with wedge resection of 02/14/11    Dr Evelene Croon  . Cardiac catheterization 08/30/2009    Dr Aleen Campi  . Intraoperative transesophageal echocardiography. 08/30/2009    Dr Kipp Brood  . Mediastinoscopy 04/03/2012    Procedure: MEDIASTINOSCOPY;  Surgeon: Alleen Borne, MD;  Location: [facility] OR;  Service: Thoracic;  Laterality: N/A;  . Portacath placement 04/03/2012    Procedure: INSERTION PORT-A-CATH;  Surgeon: Alleen Borne, MD;  Location: [facility] OR;  Service: Thoracic;  Laterality: Left;  Insertion of 9.6Fr. Pre-attached power port in Left Subclavian    REVIEW OF SYSTEMS:  A comprehensive review of systems was negative.   PHYSICAL EXAMINATION: General appearance: alert, cooperative and no distress Head: Normocephalic, without obvious abnormality, atraumatic Neck: no adenopathy Lymph nodes: Cervical, supraclavicular, and axillary nodes normal. Resp: clear to auscultation bilaterally Cardio: regular rate and rhythm, S1, S2 normal, no  murmur, click, rub or gallop GI: soft, non-tender; bowel sounds normal; no masses,  no organomegaly Extremities: extremities normal, atraumatic, no cyanosis or edema Neurologic: Alert and oriented X 3, normal strength and tone. Normal symmetric reflexes. Normal coordination and gait  ECOG PERFORMANCE STATUS: 2 - Symptomatic, <50% confined to bed  Blood pressure 96/55, pulse 78, temperature 97.8 F (36.6 C), temperature  source Oral, height 5\' 4"  (1.626 m), weight 191 lb 14.4 oz (87.045 kg).  LABORATORY DATA: Lab Results  Component Value Date   WBC 6.9 04/16/2012   HGB 13.7 04/16/2012   HCT 40.2 04/16/2012   MCV 82.9 04/16/2012   PLT 176 04/16/2012      Chemistry      Component Value Date/Time   NA 140 04/09/2012 1351   NA 145 09/06/2011 0902   K 3.7 04/09/2012 1351   K 3.9 09/06/2011 0902   CL 102 04/09/2012 1351   CL 100 09/06/2011 0902   CO2 22 04/09/2012 1351   CO2 26 09/06/2011 0902   BUN 14 04/09/2012 1351   BUN 12 09/06/2011 0902   CREATININE 0.70 04/09/2012 1351   CREATININE 0.5* 09/06/2011 0902      Component Value Date/Time   CALCIUM 10.1 04/09/2012 1351   CALCIUM 9.5 09/06/2011 0902   ALKPHOS 58 04/09/2012 1351   ALKPHOS 55 09/06/2011 0902   AST 15 04/09/2012 1351   AST 24 09/06/2011 0902   ALT 14 04/09/2012 1351   BILITOT 0.5 04/09/2012 1351   BILITOT 0.70 09/06/2011 0902       RADIOGRAPHIC STUDIES: Dg Chest 2 View Within Previous 72 Hours.  Films Obtained On Friday Are Acceptable For Monday And Tuesday Cases  04/02/2012  *RADIOLOGY REPORT*  Clinical Data: Preop lung cancer  CHEST - 2 VIEW  Comparison: PET CT scan 03/06/2038  Findings: Sternotomy wires overlie stable cardiac silhouette. Nodularity in the left upper lobe is unchanged.   No evidence of pulmonary edema or pneumothorax.  Surgical staples in the right upper lobe.  IMPRESSION: . 1.  Stable postoperative findings the right upper lobe.  2.   Stable right lung nodularity. 3.  No acute findings.  Original Report  Authenticated By: Genevive Bi, M.D.   US Biopsy  03/24/2012  *RADIOLOGY REPORT*  Clinical Data: Recurrent lung carcinoma.  Hypermetabolic lesion in the liver demonstrated by PET scan.  ULTRASOUND GUIDED CORE BIOPSY OF LIVER  Sedation:  2.0 mg IV Versed;  100 mcg IV Fentanyl  Total Moderate Sedation Time: 40 minutes.  Procedure:  The procedure, risks, benefits, and alternatives were explained to the patient.  Questions regarding the procedure were encouraged and answered.  The patient understands and consents to the procedure.  The right abdominal wall was prepped with betadine in a sterile fashion, and a sterile drape was applied covering the operative field.  A sterile gown and sterile gloves were used for the procedure. Local anesthesia was provided with 1% Lidocaine.  Ultrasound was used to localize a liver lesion in the right lobe. Under ultrasound guidance, a 17 gauge needle was advanced into the liver.  Core biopsy was performed with an 18 gauge device.  A total of four samples were obtained and submitted in formalin.  Complications: None  Findings: A rounded, roughly 9 mm hypoechoic lesion was localized in the posterior aspect of the right lobe of the liver in the region of increased metabolic activity noted on recent PET scan of 03/06/2012.  This lesion predominately was located under a rib and did show significant variance in position with respiration.  It was difficult to accurately place a needle to the level of the lesion. It was felt by ultrasound that the outer needle was located close to the edge of the lesion.  Depending on results of the biopsy, there may be some degree of sampling error due to the difficult nature of the biopsy.  IMPRESSION  Ultrasound guided core biopsy performed of a small lesion in the peripheral right lobe of the liver.  This lesion was difficult to accurately sample given its location and movement with respiration.  Original Report Authenticated By: Reola Calkins,  M.D.   Dg Chest Port 1 View  04/03/2012  *RADIOLOGY REPORT*  Clinical Data: Post mediastinoscopy and Port-A-Cath insertion  PORTABLE CHEST - 1 VIEW  Comparison: Portable exam 1435 hours compared to 04/02/2012  Findings: New left subclavian Port-A-Cath, tip projecting over SVC. Normal-sized cardiac silhouette post CABG. Calcified tortuous aorta. Pulmonary vascularity normal. Emphysematous changes with bibasilar atelectasis. No infiltrate, pleural effusion or pneumothorax. No acute osseous findings.  IMPRESSION: No pneumothorax following Port-A-Cath insertion. Emphysematous changes with bibasilar atelectasis. Post CABG.  Original Report Authenticated By: Lollie Marrow, M.D.   Dg Fluoro Guide Cv Line-no Report  04/03/2012  CLINICAL DATA: Port a cath   FLOURO GUIDE CV LINE  Fluoroscopy was utilized by the requesting physician.  No radiographic  interpretation.      ASSESSMENT: This is a very pleasant 66 years old white female with metastatic non-small cell lung cancer, adenocarcinoma status post 1 cycle of systemic chemotherapy with carboplatin and Alimta. The patient has significant fatigue and generalized weakness as well as lack of appetite and weight loss, but no significant nausea or vomiting and no fever or chills. Her CBC today is unremarkable.  PLAN: I have a lengthy discussion with the patient and her family today about her condition. I encouraged her to increase her by mouth intake and eat scan except regular basis. We will consult advanced home care to evaluate the patient for physical therapy and occupational therapy. I will continue to monitor the patient closely with weekly blood work. She would come back for followup visit in 2 weeks for reevaluation before starting the next cycle of her chemotherapy. She was advised to call me immediately if she has any concerning symptoms in the interval.  All questions were answered. The patient knows to call the clinic with any problems, questions or  concerns. We can certainly see the patient much sooner if necessary.  I spent 15 minutes counseling the patient face to face. The total time spent in the appointment was 25 minutes.

## 2012-04-16 NOTE — Telephone Encounter (Signed)
Gv pt appt for [id number] 

## 2012-04-17 ENCOUNTER — Encounter (HOSPITAL_COMMUNITY): Payer: Self-pay

## 2012-04-17 ENCOUNTER — Inpatient Hospital Stay (HOSPITAL_COMMUNITY)
Admission: AD | Admit: 2012-04-17 | Discharge: 2012-04-20 | DRG: 683 | Disposition: A | Discharge status: Home / Self Care | Payer: Medicare Other | Source: Ambulatory Visit | Attending: Internal Medicine | Admitting: Internal Medicine

## 2012-04-17 ENCOUNTER — Other Ambulatory Visit: Payer: Self-pay | Admitting: Physician Assistant

## 2012-04-17 ENCOUNTER — Encounter: Payer: Self-pay | Admitting: *Deleted

## 2012-04-17 ENCOUNTER — Inpatient Hospital Stay (HOSPITAL_COMMUNITY): Payer: Medicare Other

## 2012-04-17 DIAGNOSIS — A498 Other bacterial infections of unspecified site: Secondary | ICD-10-CM | POA: Diagnosis present

## 2012-04-17 DIAGNOSIS — Z951 Presence of aortocoronary bypass graft: Secondary | ICD-10-CM

## 2012-04-17 DIAGNOSIS — E876 Hypokalemia: Secondary | ICD-10-CM

## 2012-04-17 DIAGNOSIS — C3410 Malignant neoplasm of upper lobe, unspecified bronchus or lung: Secondary | ICD-10-CM

## 2012-04-17 DIAGNOSIS — N390 Urinary tract infection, site not specified: Secondary | ICD-10-CM | POA: Diagnosis present

## 2012-04-17 DIAGNOSIS — E119 Type 2 diabetes mellitus without complications: Secondary | ICD-10-CM | POA: Diagnosis present

## 2012-04-17 DIAGNOSIS — N179 Acute kidney failure, unspecified: Principal | ICD-10-CM | POA: Diagnosis present

## 2012-04-17 DIAGNOSIS — J4489 Other specified chronic obstructive pulmonary disease: Secondary | ICD-10-CM | POA: Diagnosis present

## 2012-04-17 DIAGNOSIS — G4700 Insomnia, unspecified: Secondary | ICD-10-CM | POA: Diagnosis present

## 2012-04-17 DIAGNOSIS — N19 Unspecified kidney failure: Secondary | ICD-10-CM

## 2012-04-17 DIAGNOSIS — F068 Other specified mental disorders due to known physiological condition: Secondary | ICD-10-CM

## 2012-04-17 DIAGNOSIS — A0472 Enterocolitis due to Clostridium difficile, not specified as recurrent: Secondary | ICD-10-CM | POA: Diagnosis present

## 2012-04-17 DIAGNOSIS — E860 Dehydration: Secondary | ICD-10-CM | POA: Diagnosis present

## 2012-04-17 DIAGNOSIS — J449 Chronic obstructive pulmonary disease, unspecified: Secondary | ICD-10-CM | POA: Diagnosis present

## 2012-04-17 DIAGNOSIS — F329 Major depressive disorder, single episode, unspecified: Secondary | ICD-10-CM

## 2012-04-17 DIAGNOSIS — R5383 Other fatigue: Secondary | ICD-10-CM

## 2012-04-17 DIAGNOSIS — Z95828 Presence of other vascular implants and grafts: Secondary | ICD-10-CM

## 2012-04-17 DIAGNOSIS — C3490 Malignant neoplasm of unspecified part of unspecified bronchus or lung: Secondary | ICD-10-CM | POA: Diagnosis present

## 2012-04-17 DIAGNOSIS — I2510 Atherosclerotic heart disease of native coronary artery without angina pectoris: Secondary | ICD-10-CM

## 2012-04-17 LAB — CBC
HCT: 36.4 % (ref 36.0–46.0)
Hemoglobin: 12.4 g/dL (ref 12.0–15.0)
RBC: 4.46 MIL/uL (ref 3.87–5.11)
WBC: 6.2 10*3/uL (ref 4.0–10.5)

## 2012-04-17 LAB — CREATININE, SERUM
Creatinine, Ser: 1.99 mg/dL — ABNORMAL HIGH (ref 0.50–1.10)
GFR calc Af Amer: 29 mL/min — ABNORMAL LOW (ref >90)
GFR calc non Af Amer: 25 mL/min — ABNORMAL LOW (ref >90)

## 2012-04-17 MED ORDER — ENOXAPARIN SODIUM 30 MG/0.3ML ~~LOC~~ SOLN
30.0000 mg | SUBCUTANEOUS | Status: DC
  Administered 2012-04-17: 30 mg via SUBCUTANEOUS
  Filled 2012-04-17 (×2): qty 0.3

## 2012-04-17 MED ORDER — PROCHLORPERAZINE MALEATE 10 MG PO TABS
10.0000 mg | ORAL_TABLET | Freq: Four times a day (QID) | ORAL | Status: DC | PRN
  Administered 2012-04-19: 10 mg via ORAL
  Filled 2012-04-17: qty 1

## 2012-04-17 MED ORDER — METFORMIN HCL 500 MG PO TABS: 500.0000 mg | ORAL_TABLET | Freq: Two times a day (BID) | ORAL | Status: DC

## 2012-04-17 MED ORDER — DULOXETINE HCL 60 MG PO CPEP: 60.0000 mg | ORAL_CAPSULE | Freq: Every day | ORAL | Status: DC

## 2012-04-17 MED ORDER — CEPHALEXIN 500 MG PO CAPS
500.0000 mg | ORAL_CAPSULE | Freq: Two times a day (BID) | ORAL | Status: DC
  Administered 2012-04-17 – 2012-04-20 (×7): 500 mg via ORAL
  Filled 2012-04-17 (×9): qty 1

## 2012-04-17 MED ORDER — POTASSIUM CHLORIDE IN NACL 20-0.9 MEQ/L-% IV SOLN
INTRAVENOUS | Status: DC
  Administered 2012-04-17: 14:00:00 via INTRAVENOUS
  Filled 2012-04-17 (×3): qty 1000

## 2012-04-17 MED ORDER — FOLIC ACID 1 MG PO TABS
1.0000 mg | ORAL_TABLET | Freq: Every day | ORAL | Status: DC
  Administered 2012-04-17 – 2012-04-20 (×4): 1 mg via ORAL
  Filled 2012-04-17 (×4): qty 1

## 2012-04-17 MED ORDER — BUDESONIDE-FORMOTEROL FUMARATE 160-4.5 MCG/ACT IN AERO
2.0000 | INHALATION_SPRAY | Freq: Two times a day (BID) | RESPIRATORY_TRACT | Status: DC
  Administered 2012-04-17 – 2012-04-20 (×6): 2 via RESPIRATORY_TRACT
  Filled 2012-04-17: qty 6

## 2012-04-17 MED ORDER — GUAIFENESIN ER 600 MG PO TB12
600.0000 mg | ORAL_TABLET | Freq: Two times a day (BID) | ORAL | Status: DC | PRN
  Filled 2012-04-17: qty 1

## 2012-04-17 MED ORDER — FERROUS SULFATE 325 (65 FE) MG PO TABS
325.0000 mg | ORAL_TABLET | Freq: Every day | ORAL | Status: DC
  Administered 2012-04-18 – 2012-04-20 (×3): 325 mg via ORAL
  Filled 2012-04-17 (×5): qty 1

## 2012-04-17 MED ORDER — POTASSIUM CHLORIDE 10 MEQ/100ML IV SOLN
10.0000 meq | INTRAVENOUS | Status: AC
  Administered 2012-04-17 (×4): 10 meq via INTRAVENOUS
  Filled 2012-04-17 (×4): qty 100

## 2012-04-17 MED ORDER — ADULT MULTIVITAMIN W/MINERALS CH
1.0000 | ORAL_TABLET | Freq: Every day | ORAL | Status: DC
  Administered 2012-04-17 – 2012-04-20 (×4): 1 via ORAL
  Filled 2012-04-17 (×4): qty 1

## 2012-04-17 MED ORDER — ALBUTEROL SULFATE HFA 108 (90 BASE) MCG/ACT IN AERS
2.0000 | INHALATION_SPRAY | Freq: Four times a day (QID) | RESPIRATORY_TRACT | Status: DC | PRN
  Filled 2012-04-17: qty 6.7

## 2012-04-17 NOTE — Progress Notes (Signed)
UR complete 

## 2012-04-17 NOTE — Progress Notes (Signed)
Pt has her home cpap machine & has been checked by [employer]. Pt will place on & off herself.  Jacqulynn Cadet RRT

## 2012-04-17 NOTE — H&P (Signed)
Veronica Duarte is an 66 y.o. female.   Chief Complaint: Patient is being admitted with acute renal failure, hypokalemia and dehydration. HPI: The patient is a pleasant 66 year old white female with metastatic non-small cell lung cancer, adenocarcinoma initially diagnosed as stage IB (T2 A, N0, M0 ) in March of 2012. She is status post wedge resection of the right upper lobe under the care Dr. Edwyna Shell in March 2012. She is also status post 1 cycle of systemic chemotherapy with carboplatin for an AUC of 5 and Alimta 500 mg region squared every 3 weeks. She was evaluated in the [facility] on 04/16/2012 by Dr. Arbutus Ped. Her C. met revealed a potassium level II.9 with a BUN of 42 creatinine of 2.48. She admits to not drinking fluids as well as she should and decrease by mouth intake. She also reports urinary frequency as well as some dysuria. She'll year in culture and sensitivity there revealed Escherichia coli and was placed on a seven-day course of Keflex at 500 mg by mouth twice daily by Dr. Monia Sabal and 7. This antibiotic course was started on 04/14/2012. She complains of increased shortness breath with exertion as well as a cough that has been productive of yellow secretions. She denied any fever or chills. She has a history of sleep apnea and is brought her own CPAP machine to use while she is in the hospital. She also complains of having diarrhea for the past 2-3 days.   Her daughter is at the bedside and if this with the history. She recently had a discussion with her brothers cardiologist and they have held all of her blood pressure medication secondary to recent significant hypotension. The patient is due to see her primary care physician shortly regarding a change in her antidepressant medication. She apparently also has a history of some minor dementia according to the patient's daughter.  A comprehensive review of systems was negative except for: Constitutional: positive for anorexia and  malaise Respiratory: positive for cough and sputum Cardiovascular: positive for dyspnea and Hypotension Gastrointestinal: positive for diarrhea Genitourinary: positive for dysuria and frequency   Past Medical History  Diagnosis Date  . Diabetes mellitus     type 2- metformin  . ADENOCARCINOMA, RIGHT LUNG, UPPER LOBE 02/04/2011  . Depression   . RAS (renal artery stenosis)   . AAA (abdominal aortic aneurysm)   . Osteoarthritis   . COPD (chronic obstructive pulmonary disease)   . Hx of colonic polyps   . CAD (coronary artery disease)   . OSA (obstructive sleep apnea)     Uses CPAP pressure is 6 study was done > 5 years  . Hypertension     Sees Dr. Tresa Endo with [facility]    Past Surgical History  Procedure Date  . 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 Vesicovaginal fistula closure w/ tah   . Abdominal hysterectomy   . Right video-assisted thoracoscopy with wedge resection of 02/14/11    Dr Evelene Croon  . Cardiac catheterization 08/30/2009    Dr Aleen Campi  . Intraoperative transesophageal echocardiography. 08/30/2009    Dr Kipp Brood  . Mediastinoscopy 04/03/2012    Procedure: MEDIASTINOSCOPY;  Surgeon: Alleen Borne, MD;  Location: [facility] OR;  Service: Thoracic;  Laterality: N/A;  . Portacath placement 04/03/2012    Procedure: INSERTION PORT-A-CATH;  Surgeon: Alleen Borne, MD;  Location: [facility] OR;  Service: Thoracic;  Laterality: Left;  Insertion of 9.6Fr. Pre-attached power  port in Left Subclavian    Family History  Problem Relation Age of Onset  . Arthritis    . Hyperlipidemia    . Hypertension    . Depression    . Anesthesia problems Neg Hx    Social History:  reports that she quit smoking about 9 years ago. Her smoking use included Cigarettes. She has a 60 pack-year smoking history. She has never used smokeless tobacco. She reports that she does not drink alcohol or use illicit drugs.  Allergies: No Known  Allergies  Medications Prior to Admission  Medication Sig Dispense Refill  . Aclidinium Bromide (TUDORZA PRESSAIR IN) Inhale 1 puff into the lungs 2 (two) times daily.      Marland Kitchen albuterol (PROVENTIL HFA;VENTOLIN HFA) 108 (90 BASE) MCG/ACT inhaler Inhale 2 puffs into the lungs every 6 (six) hours as needed. For shortness of breath      . budesonide-formoterol (SYMBICORT) 160-4.5 MCG/ACT inhaler Inhale 2 puffs into the lungs 2 (two) times daily.      . cephALEXin (KEFLEX) 500 MG capsule Take 500 mg by mouth 2 (two) times daily. 7 day course of therapy started 04/14/12      . cetirizine (ZYRTEC) 10 MG tablet Take 10 mg by mouth daily as needed. For allergies      . dexamethasone (DECADRON) 4 MG tablet Take 4 mg by mouth See admin instructions. 1 tab BID day before of and after chemo      . DULoxetine (CYMBALTA) 60 MG capsule Take 60 mg by mouth daily.      Marland Kitchen ezetimibe (ZETIA) 10 MG tablet Take 10 mg by mouth daily.       . ferrous sulfate 325 (65 FE) MG tablet Take 325 mg by mouth daily with breakfast.       . folic acid (FOLVITE) 1 MG tablet Take 1 mg by mouth daily.      Marland Kitchen guaiFENesin (MUCINEX) 600 MG 12 hr tablet Take 600 mg by mouth 2 (two) times daily as needed. For congestion      . lidocaine-prilocaine (EMLA) cream Apply 1 application topically as needed. Apply to port 1 hour before chemo appointment      . meclizine (ANTIVERT) 25 MG tablet Take 25 mg by mouth 3 (three) times daily as needed. For dizziness.      . metFORMIN (GLUCOPHAGE) 500 MG tablet Take 500 mg by mouth 2 (two) times daily with a meal.      . Multiple Vitamin (MULITIVITAMIN WITH MINERALS) TABS Take 1 tablet by mouth daily.      . niacin (NIASPAN) 1000 MG CR tablet Take 1,000 mg by mouth at bedtime.       Marland Kitchen Phenazopyridine HCl (PYRIDIUM PO) Take 2 tablets by mouth 3 (three) times daily as needed. For UTI or urinary tract pain.      Marland Kitchen PRESCRIPTION MEDICATION Inject into the vein every 21 ( twenty-one) days. Alimta and Carboplatin  every 3 weeks.      . prochlorperazine (COMPAZINE) 10 MG tablet Take 10 mg by mouth every 6 (six) hours as needed. For nausea      . Risedronate Sodium 35 MG TBEC Take 1 tablet by mouth once a week. Sundays      . rosuvastatin (CRESTOR) 20 MG tablet Take 20 mg by mouth daily.       . vitamin C (ASCORBIC ACID) 500 MG tablet Take 500 mg by mouth 2 (two) times daily.       Marland Kitchen diltiazem (DILACOR  XR) 120 MG 24 hr capsule Take 120 mg by mouth at bedtime.       Marland Kitchen losartan (COZAAR) 50 MG tablet Take 1 tablet by mouth daily.      . Nebivolol HCl (BYSTOLIC) 20 MG TABS Take 1 tablet by mouth daily.         Results for orders placed in visit on 04/16/12 (from the past 48 hour(s))  CBC WITH DIFFERENTIAL     Status: Normal   Collection Time   04/16/12 11:06 AM      Component Value Range Comment   WBC 6.9  3.9 - 10.3 (10e3/uL)    NEUT# 5.1  1.5 - 6.5 (10e3/uL)    HGB 13.7  11.6 - 15.9 (g/dL)    HCT 21.3  08.6 - 57.8 (%)    Platelets 176  145 - 400 (10e3/uL)    MCV 82.9  79.5 - 101.0 (fL)    MCH 28.3  25.1 - 34.0 (pg)    MCHC 34.2  31.5 - 36.0 (g/dL)    RBC 4.69  6.29 - 5.28 (10e6/uL)    RDW 13.5  11.2 - 14.5 (%)    lymph# 1.6  0.9 - 3.3 (10e3/uL)    MONO# 0.2  0.1 - 0.9 (10e3/uL)    Eosinophils Absolute 0.1  0.0 - 0.5 (10e3/uL)    Basophils Absolute 0.0  0.0 - 0.1 (10e3/uL)    NEUT% 73.1  38.4 - 76.8 (%)    LYMPH% 22.4  14.0 - 49.7 (%)    MONO% 3.3  0.0 - 14.0 (%)    EOS% 1.0  0.0 - 7.0 (%)    BASO% 0.2  0.0 - 2.0 (%)    nRBC 0  0 - 0 (%)   COMPREHENSIVE METABOLIC PANEL     Status: Abnormal   Collection Time   04/16/12 11:06 AM      Component Value Range Comment   Sodium 134 (*) 135 - 145 (mEq/L)    Potassium 2.9 (*) 3.5 - 5.3 (mEq/L)    Chloride 96  96 - 112 (mEq/L)    CO2 23  19 - 32 (mEq/L)    Glucose, Bld 121 (*) 70 - 99 (mg/dL)    BUN 42 (*) 6 - 23 (mg/dL)    Creatinine, Ser 4.13 (*) 0.50 - 1.10 (mg/dL)    Total Bilirubin 1.4 (*) 0.3 - 1.2 (mg/dL)    Alkaline Phosphatase 49  39 -  117 (U/L)    AST 26  0 - 37 (U/L)    ALT 24  0 - 35 (U/L)    Total Protein 6.7  6.0 - 8.3 (g/dL)    Albumin 4.2  3.5 - 5.2 (g/dL)    Calcium 9.1  8.4 - 10.5 (mg/dL)    No results found.    BP 103/71  Pulse 81  Temp(Src) 98.2 F (36.8 C) (Oral)  Resp 15  Ht 5\' 4"  (1.626 m)  Wt 87.045 kg (191 lb 14.4 oz)  BMI 32.94 kg/m2  SpO2 97% General appearance: alert, cooperative and no distress Head: Normocephalic, without obvious abnormality, atraumatic Neck: no adenopathy, no carotid bruit, no JVD, supple, symmetrical, trachea midline, thyroid not enlarged, symmetric, no tenderness/mass/nodules and resolving area of ecchymosis mid neck, beneath the chin Resp: clear to auscultation bilaterally Cardio: regular rate and rhythm, S1, S2 normal, no murmur, click, rub or gallop GI: soft, non-tender; bowel sounds normal; no masses,  no organomegaly Extremities: extremities normal, atraumatic, no cyanosis or edema Neurologic: Grossly normal  Assessment/Plan The patient is a pleasant 66 year old white female with metastatic non-small cell adenocarcinoma. She is being admitted to address acute renal failure, hypokalemia and dehydration. She will be admitted to the inpatient oncology service, placed on IV fluids consisting of normal saline with 20 mEq of potassium as well as receiving 4 "K. runs" of the 10 mEq of potassium chloride each. To further evaluate her productive cough we'll obtain a chest x-ray. To evaluate the diarrhea a sample will be sent for C. difficile culture and she will be placed on contact precautions until the results are known. Should occult from the positive appropriate treatment will be instituted. She will continue her course of Keflex for her Escherichia coli urinary tract infection. We will continue to hold her blood pressure medications but we'll monitor her blood pressure closely. She will continue her diabetes medications and and we'll follow a moderate carbohydrate "diabetic"  diet. Her blood sugars will be monitored as well. We will monitor her renal function closely as well.  Tiana Loft E PA-C 04/17/2012, 1:22 PM  Hematology/oncology attending: Ms. Schall is seen and examined. I agree with the above note. The patient has recurrent non-small cell lung cancer, adenocarcinoma. She was started on systemic chemotherapy with carboplatin and Alimta status post 1 cycle. She has been complaining of increasing fatigue and weakness as well as poor by mouth intake. Comprehensive metabolic panel performed yesterday showed a patient of her serum creatinine from 0.7 to 2.49 indicating severe dehydration and acute renal failure. The patient was also found to have hypokalemia with potassium of 2.9. She was admitted today for evaluation and IV hydration. She continues to have diarrhea. We will check her stool for C. Difficile. We'll continue to monitor the patient closely during this admission and discharge her home once she is stable with improvement in her renal function.

## 2012-04-17 NOTE — Progress Notes (Signed)
PHARMACIST - PHYSICIAN COMMUNICATION CONCERNING:  METFORMIN SAFE ADMINISTRATION POLICY - Patient in Acute Renal Failure  RECOMMENDATION: Metformin has been placed on DISCONTINUE (rejected order) STATUS and should be reordered only after any of the conditions below are ruled out.  Current safety recommendations include avoiding metformin for a minimum of 48 hours after the patient's exposure to intravenous contrast media.  DESCRIPTION:  The Pharmacy Committee has adopted a policy that restricts the use of metformin in hospitalized patients until all the contraindications to administration have been ruled out. Specific contraindications are: []  Serum creatinine ? 1.5 for males []  Serum creatinine ? 1.4 for females []  Shock, acute MI, sepsis, hypoxemia, dehydration []  Planned administration of intravenous iodinated contrast media []  Heart Failure patients with low EF []  Acute or chronic metabolic acidosis (including DKA)     **Cymbalta has also been discontinued since use is contraindicated in CrCl<30 ml/min.  May reorder once renal function has improved.  Clance Boll, PharmD, BCPS Pager: [phone] 04/17/2012 1:55 PM

## 2012-04-17 NOTE — Progress Notes (Signed)
Per Dr Donnald Garre, based on CMET results, pt needs to be directly admitted for hypokalemia, deyhydration, and acute renal failure.  Pt's daughter aware, will be bringing pt to admitting.  Pt will be admitted to [facility].  Informed Thelma Barge in medical mgmt.  SLJ

## 2012-04-17 NOTE — Progress Notes (Signed)
Quick Note:  Call patient with the result and start K Dur 40 meq PO X 7 days. Encourage PO intake for IV NS today. May need admission via ED if not drinking enough. ______

## 2012-04-18 ENCOUNTER — Other Ambulatory Visit: Payer: Self-pay | Admitting: Oncology

## 2012-04-18 DIAGNOSIS — R197 Diarrhea, unspecified: Secondary | ICD-10-CM

## 2012-04-18 LAB — BASIC METABOLIC PANEL
Calcium: 8.5 mg/dL (ref 8.4–10.5)
GFR calc Af Amer: 32 mL/min — ABNORMAL LOW (ref >90)
GFR calc non Af Amer: 28 mL/min — ABNORMAL LOW (ref >90)
Potassium: 2.8 mEq/L — ABNORMAL LOW (ref 3.5–5.1)
Sodium: 135 mEq/L (ref 135–145)

## 2012-04-18 LAB — GLUCOSE, CAPILLARY

## 2012-04-18 MED ORDER — ENOXAPARIN SODIUM 40 MG/0.4ML ~~LOC~~ SOLN
40.0000 mg | SUBCUTANEOUS | Status: DC
  Administered 2012-04-18 – 2012-04-19 (×2): 40 mg via SUBCUTANEOUS
  Filled 2012-04-18 (×3): qty 0.4

## 2012-04-18 MED ORDER — SODIUM CHLORIDE 0.9 % IV SOLN
INTRAVENOUS | Status: DC
  Administered 2012-04-18 – 2012-04-19 (×4): via INTRAVENOUS
  Filled 2012-04-18 (×10): qty 1000

## 2012-04-18 MED ORDER — DULOXETINE HCL 60 MG PO CPEP
60.0000 mg | ORAL_CAPSULE | Freq: Every day | ORAL | Status: DC
  Administered 2012-04-18 – 2012-04-20 (×3): 60 mg via ORAL
  Filled 2012-04-18 (×3): qty 1

## 2012-04-18 MED ORDER — POTASSIUM CHLORIDE CRYS ER 20 MEQ PO TBCR
20.0000 meq | EXTENDED_RELEASE_TABLET | Freq: Two times a day (BID) | ORAL | Status: DC
  Administered 2012-04-18 – 2012-04-20 (×5): 20 meq via ORAL
  Filled 2012-04-18 (×6): qty 1

## 2012-04-18 MED ORDER — METRONIDAZOLE 500 MG PO TABS
500.0000 mg | ORAL_TABLET | Freq: Four times a day (QID) | ORAL | Status: DC
  Administered 2012-04-18 – 2012-04-20 (×8): 500 mg via ORAL
  Filled 2012-04-18 (×10): qty 1

## 2012-04-18 NOTE — Progress Notes (Signed)
Subjective: 66 yo with stage 4 lung ca , on premetraxate/carbo , C1; admitted with dehydration, reanl failure and hypokalemia Feeling better today, bo diarrhea, no n/v.  Objective: Temp:  [97.2 F (36.2 C)-99.4 F (37.4 C)] 97.2 F (36.2 C) (06/01 0445) Pulse Rate:  [71-81] 71  (06/01 0445) Resp:  [15-18] 16  (06/01 0445) BP: (93-115)/(59-71) 115/59 mmHg (06/01 0445) SpO2:  [97 %-100 %] 100 % (06/01 0445) Weight:  [191 lb 14.4 oz (87.045 kg)] 191 lb 14.4 oz (87.045 kg) (05/31 1100)  General appearance: alert, cooperative and appears stated age Throat: lips, mucosa, and tongue normal; teeth and gums normal Resp: clear to auscultation bilaterally and normal percussion bilaterally GI: soft, non-tender; bowel sounds normal; no masses,  no organomegaly Extremities: extremities normal, atraumatic, no cyanosis or edema Pulses: 2+ and symmetric  Labs:  Basename 04/17/12 1345 04/16/12 1106  WBC 6.2 6.9  HGB 12.4 13.7  HCT 36.4 40.2  PLT 122* 176    Basename 04/18/12 0430 04/17/12 1345 04/16/12 1106  NA 135 -- 134*  K 2.8* -- 2.9*  CL 99 -- 96  CO2 23 -- 23  GLUCOSE 100* -- 121*  BUN 26* -- 42*  CREATININE 1.79* 1.99* --  CALCIUM 8.5 -- 9.1    Results for orders placed during the hospital encounter of 04/03/12  SURGICAL PCR SCREEN     Status: Normal   Collection Time   04/02/12  1:35 PM      Component Value Range Status Comment   MRSA, PCR NEGATIVE  NEGATIVE  Final    Staphylococcus aureus NEGATIVE  NEGATIVE  Final   URINE CULTURE     Status: Normal   Collection Time   04/03/12  8:45 AM      Component Value Range Status Comment   Specimen Description URINE, CLEAN CATCH   Final    Special Requests NONE   Final    Culture  Setup Time [id number]   Final    Colony Count >=100,000 COLONIES/ML   Final    Culture ESCHERICHIA COLI   Final    Report Status 04/05/2012 FINAL   Final    Organism ID, Bacteria ESCHERICHIA COLI   Final      X-ray Chest Pa And Lateral   04/17/2012   *RADIOLOGY REPORT*  Clinical Data: Productive cough, COPD  CHEST - 2 VIEW  Comparison: 04/03/2012  Findings: 11 mm nodule in the right mid lung.  Additional known right pulmonary nodule is not radiographically apparent.  Chronic interstitial markings/emphysematous changes.  The heart is normal in size.  Postsurgical changes related to prior CABG.  Mild prominence of the right paratracheal region/hilum, corresponding to known adenopathy.  Left chest port.  IMPRESSION: 11 mm nodule in the right mid lung.  Additional known right upper lobe nodule is not radiographically apparent.  Prominence of the right paratracheal region/hilum, corresponding to known adenopathy.  Original Report Authenticated By: Charline Bills, M.D.  :  Pathology: NSCLC :  Disposition :Hypokalemia, K 2.8- Increase IVF with 30 meq KCL and po K+, check Mg Diarrhea-improved, c diff pending. Restart cymbalat and oral inhaler.

## 2012-04-19 DIAGNOSIS — G4700 Insomnia, unspecified: Secondary | ICD-10-CM

## 2012-04-19 DIAGNOSIS — C3410 Malignant neoplasm of upper lobe, unspecified bronchus or lung: Secondary | ICD-10-CM

## 2012-04-19 LAB — BASIC METABOLIC PANEL
BUN: 15 mg/dL (ref 6–23)
Chloride: 106 mEq/L (ref 96–112)
Creatinine, Ser: 1.43 mg/dL — ABNORMAL HIGH (ref 0.50–1.10)
GFR calc Af Amer: 43 mL/min — ABNORMAL LOW (ref >90)
GFR calc non Af Amer: 37 mL/min — ABNORMAL LOW (ref >90)
Glucose, Bld: 96 mg/dL (ref 70–99)

## 2012-04-19 MED ORDER — MEGESTROL ACETATE 400 MG/10ML PO SUSP
400.0000 mg | Freq: Every day | ORAL | Status: DC
Start: 2012-04-19 — End: 2012-04-20
  Administered 2012-04-19 – 2012-04-20 (×2): 400 mg via ORAL
  Filled 2012-04-19 (×2): qty 10

## 2012-04-19 MED ORDER — ZOLPIDEM TARTRATE 5 MG PO TABS: 5.0000 mg | ORAL_TABLET | Freq: Every evening | ORAL | Status: DC | PRN

## 2012-04-19 MED ORDER — ACETAMINOPHEN 325 MG PO TABS
650.0000 mg | ORAL_TABLET | Freq: Once | ORAL | Status: AC
  Administered 2012-04-19: 650 mg via ORAL
  Filled 2012-04-19: qty 1

## 2012-04-19 NOTE — Progress Notes (Signed)
Patient complaining of a headache 5/10. Frontal headache not affected by light. Notified physician, orders entered.  Will continue to monitor throughout shift.

## 2012-04-19 NOTE — Progress Notes (Signed)
04/18/12 2315 C-diff positive result called to Dr. Donnie Coffin and orders received.

## 2012-04-19 NOTE — Progress Notes (Signed)
SUBJECTIVE: Feels a little better although appetite not improved.  No diarrhea this am but c.diff positive.      MEDICATIONS:  Scheduled:   . budesonide-formoterol  2 puff Inhalation BID  . cephALEXin  500 mg Oral Q12H  . DULoxetine  60 mg Oral Daily  . enoxaparin  40 mg Subcutaneous Q24H  . ferrous sulfate  325 mg Oral Q breakfast  . folic acid  1 mg Oral Daily  . metroNIDAZOLE  500 mg Oral Q6H  . mulitivitamin with minerals  1 tablet Oral Daily  . potassium chloride  20 mEq Oral BID  . DISCONTD: enoxaparin  30 mg Subcutaneous Q24H   Continuous:   . 0.9 % sodium chloride with kcl 125 mL/hr at 04/19/12 1054   ZOX:[id number], guaiFENesin, prochlorperazine   PHYSICAL EXAM  Vital signs in last 24 hours: Temp:  [97.9 F (36.6 C)-98.4 F (36.9 C)] 98.4 F (36.9 C) (06/02 0527) Pulse Rate:  [62-75] 62  (06/02 0527) Resp:  [16-18] 18  (06/02 0527) BP: (123-125)/(66-76) 123/76 mmHg (06/02 0527) SpO2:  [95 %-96 %] 96 % (06/02 0527)  Intake/Output from previous day: 06/01 0701 - 06/02 0700 In: 3989.3 [P.O.:640; I.V.:3349.3] Out: 300 [Urine:300] Intake/Output this shift: Total I/O In: -  Out: 750 [Urine:750]  General appearance: alert and cooperative Resp: clear to auscultation bilaterally and normal percussion bilaterally Cardio: regular rate and rhythm, S1, S2 normal, no murmur, click, rub or gallop GI: soft, non-tender; bowel sounds normal; no masses,  no organomegaly Extremities: extremities normal, atraumatic, no cyanosis or edema   LABS:  CBC    Component Value Date/Time   WBC 6.2 04/17/2012 1345   WBC 6.9 04/16/2012 1106   RBC 4.46 04/17/2012 1345   RBC 4.85 04/16/2012 1106   HGB 12.4 04/17/2012 1345   HGB 13.7 04/16/2012 1106   HCT 36.4 04/17/2012 1345   HCT 40.2 04/16/2012 1106   PLT 122* 04/17/2012 1345   PLT 176 04/16/2012 1106   MCV 81.6 04/17/2012 1345   MCV 82.9 04/16/2012 1106   MCH 27.8 04/17/2012 1345   MCH 28.3 04/16/2012 1106   MCHC 34.1 04/17/2012 1345     MCHC 34.2 04/16/2012 1106   RDW 12.7 04/17/2012 1345   RDW 13.5 04/16/2012 1106   LYMPHSABS 1.6 04/16/2012 1106   LYMPHSABS 2.2 12/31/2011 0925   MONOABS 0.2 04/16/2012 1106   MONOABS 0.5 12/31/2011 0925   EOSABS 0.1 04/16/2012 1106   EOSABS 0.1 12/31/2011 0925   BASOSABS 0.0 04/16/2012 1106   BASOSABS 0.0 12/31/2011 0925     Basename 04/19/12 0653 04/18/12 0430  NA 137 135  K 3.4* 2.8*  CL 106 99  CO2 21 23  GLUCOSE 96 100*  BUN 15 26*  CREATININE 1.43* 1.79*  CALCIUM 8.0* 8.5    ASSESSMENT and PLAN: 1. Stage IV lung cancer admitted with acute renal failure secondary to dehydration.  Renal function much improved.  Encourage po fluid intake. 2. Anorexia - No history for blood clots, so will attempt megace elixir. Declined dietician consult.  Boost/Ensure with meals. 3. Clostridia difficile colitis - On flagyl.  4. Insomnia - Ambien prn for sleep.  5.  Continue with K+ replacement.   Arlan Organ I., MD 04/19/2012

## 2012-04-20 ENCOUNTER — Telehealth: Payer: Self-pay | Admitting: Medical Oncology

## 2012-04-20 ENCOUNTER — Other Ambulatory Visit: Payer: Self-pay | Admitting: Physician Assistant

## 2012-04-20 DIAGNOSIS — C3490 Malignant neoplasm of unspecified part of unspecified bronchus or lung: Secondary | ICD-10-CM

## 2012-04-20 DIAGNOSIS — N390 Urinary tract infection, site not specified: Secondary | ICD-10-CM

## 2012-04-20 DIAGNOSIS — A0472 Enterocolitis due to Clostridium difficile, not specified as recurrent: Secondary | ICD-10-CM

## 2012-04-20 DIAGNOSIS — R630 Anorexia: Secondary | ICD-10-CM

## 2012-04-20 LAB — BASIC METABOLIC PANEL
BUN: 12 mg/dL (ref 6–23)
CO2: 21 mEq/L (ref 19–32)
GFR calc non Af Amer: 40 mL/min — ABNORMAL LOW (ref >90)
Glucose, Bld: 103 mg/dL — ABNORMAL HIGH (ref 70–99)
Potassium: 3.8 mEq/L (ref 3.5–5.1)
Sodium: 140 mEq/L (ref 135–145)

## 2012-04-20 LAB — GLUCOSE, CAPILLARY: Glucose-Capillary: 103 mg/dL — ABNORMAL HIGH (ref 70–99)

## 2012-04-20 MED ORDER — HEPARIN SOD (PORK) LOCK FLUSH 100 UNIT/ML IV SOLN
500.0000 [IU] | Freq: Once | INTRAVENOUS | Status: AC
  Administered 2012-04-20: 500 [IU] via INTRAVENOUS
  Filled 2012-04-20: qty 5

## 2012-04-20 MED ORDER — MEGESTROL ACETATE 400 MG/10ML PO SUSP: 400.0000 mg | Freq: Every day | ORAL | Status: DC

## 2012-04-20 MED ORDER — METRONIDAZOLE 500 MG PO TABS: 500.0000 mg | ORAL_TABLET | Freq: Four times a day (QID) | ORAL | Status: AC

## 2012-04-20 NOTE — Telephone Encounter (Signed)
Message copied by Charma Igo on Mon Apr 20, 2012 10:32 AM ------      Message from: Si Gaul      Created: Fri Apr 17, 2012  9:06 AM       Call patient with the result and start K Dur 40 meq PO X 7 days. Encourage PO intake for IV NS today. May need admission via ED if not drinking enough.

## 2012-04-20 NOTE — Progress Notes (Signed)
Patient given all discharge instructions. Expressed understanding. Will follow up with Dr. Ivonne Andrew. Will discharge home with daughter.

## 2012-04-20 NOTE — Progress Notes (Signed)
Patient ID: Veronica Duarte, female   DOB: 1946/06/14, 66 y.o.   MRN: [id number] Subjective: Feeling a little better today. Appetite remains poor. C. Diff was positive, currently on treatment with Flagyl. No diarrhea last night or thus far this morning. Will finish a 7 day course of Keflex for her E. Coli UTI today.  Objective: Vital signs in last 24 hours: Temp:  [98.1 F (36.7 C)-98.5 F (36.9 C)] 98.5 F (36.9 C) (06/03 0551) Pulse Rate:  [62-68] 62  (06/03 0551) Resp:  [16-20] 20  (06/03 0551) BP: (110-134)/(70-85) 132/85 mmHg (06/03 0551) SpO2:  [96 %-99 %] 99 % (06/03 0551)  Intake/Output from previous day: 06/02 0701 - 06/03 0700 In: 1866.7 [P.O.:300; I.V.:1566.7] Out: 1300 [Urine:1300] Intake/Output this shift:    General appearance: alert, cooperative, appears stated age and no distress Resp: clear to auscultation bilaterally Cardio: regular rate and rhythm, S1, S2 normal, no murmur, click, rub or gallop GI: soft, non-tender; bowel sounds normal; no masses,  no organomegaly Extremities: extremities normal, atraumatic, no cyanosis or edema  Lab Results:   Basename 04/17/12 1345  WBC 6.2  HGB 12.4  HCT 36.4  PLT 122*   BMET  Basename 04/20/12 0600 04/19/12 0653  NA 140 137  K 3.8 3.4*  CL 110 106  CO2 21 21  GLUCOSE 103* 96  BUN 12 15  CREATININE 1.33* 1.43*  CALCIUM 8.1* 8.0*    Studies/Results: No results found.  Medications: I have reviewed the patient's current medications.  Assessment/Plan: 1. 1. Metastatic non-small cell lung cancer, adenocarcinoma, status post 1 cycle of systemic chemotherapy with carboplatin and Alimta. Admitted with acute renal failure secondary to dehydration. 2. Hypokalemia- repleated and resolved 3. Anorexia- continues. Patient has no history or blood clots and is being given a trial of megace elixir for appetite stimulation 4. E. Coli UTI- discontinue Keflex after today as she will have completed a 7 day course. 5. C.  Difficile colitis, continue flagyl.   LOS: 3 days    Marlana Salvage 04/20/2012 8:39 AM  Hematology/oncology attending: The patient is seen and examined. I agree with the above note. She feels better today and ready to go home. She will continue on treatment for C. Difficile colitis on an outpatient basis with Flagyl. She would come back for followup visit as previously scheduled.

## 2012-04-20 NOTE — Discharge Summary (Signed)
Physician Discharge Summary  Patient ID: Veronica Duarte MRN: [id number] DOB/AGE: [birth year] 66 y.o.  Admit date: 04/17/2012 Discharge date: 04/20/2012   Discharge Diagnoses:  1. Metastatic non-small cell lung cancer-adenocarcinoma, status post 1 cycle of systemic chemotherapy with carboplatin and Alimta  2.  acute renal failure secondary to dehydration-given IV fluids admitted with improvement in renal function 3. Hypokalemia-depleted and resolved 4. E. coli urinary tract infection-7 day course of Keflex continued while admitted 5. C. difficile colitis treated with Flagyl 6. Anorexia-no history of deep vein thrombosis patient placed on Megace for appetite stimulation Active Problems:  * No active hospital problems. *    Discharged Condition: fair  Hospital Course: The patient is a pleasant 66 year old white female diagnosed with metastatic non-small cell lung cancer, admitted with acute renal failure secondary to dehydration, significant hypokalemia, and anorexia. She was admitted to the inpatient oncology service and placed on IV fluids for rehydration with improvement in her renal function. The hypokalemia was repleted. A few days prior to admission she had been diagnosed with an Escherichia coli urinary tract infection and placed on a seven-day course of Keflex that was continued while she was an inpatient. Prior to the patient's admission, the patient's daughter discussed her hypotension with her cardiologist who recommended that all of her blood pressure medications be held. We continue to hold her blood pressure medications while she was admitted and her blood pressure remained in good control while off her medications. She complained of to 3 days of diarrhea prior to admission. The stool was tested for clostridium difficile and was found to be positive. She was placed on contact precautions and started on Flagyl for appropriate treatment. Her appetite remained poor. She refused a dietitians  consultation. She had no prior history of deep vein thrombosis and was started on Megace elixir for appetite stimulation. The day of discharge her her renal function had improved and she had no further episodes of diarrhea. The hypokalemia had resolved. She has a history of sleep apnea and used her home CPAP machine while she was admitted.  Consults: Respiratory therapy  Significant Diagnostic Studies: Stool for C. difficile  Treatments: respiratory therapy: Check patient's CPAP machine as well as administered her inhalers.  Discharge Exam: Blood pressure 105/67, pulse 78, temperature 97.8 F (36.6 C), temperature source Oral, resp. rate 20, height 5\' 4"  (1.626 m), weight 191 lb 14.4 oz (87.045 kg), SpO2 100.00%. General appearance: alert, cooperative, appears stated age and no distress Head: Normocephalic, without obvious abnormality, atraumatic Neck: no adenopathy, no carotid bruit, no JVD, supple, symmetrical, trachea midline and thyroid not enlarged, symmetric, no tenderness/mass/nodules Resp: clear to auscultation bilaterally Cardio: regular rate and rhythm, S1, S2 normal, no murmur, click, rub or gallop GI: soft, non-tender; bowel sounds normal; no masses,  no organomegaly Extremities: extremities normal, atraumatic, no cyanosis or edema Neurologic: Grossly normal  Disposition: 01-Home or Self Care  Discharge Orders    Future Appointments: Provider: Department: Dept Phone: Center:   04/21/2012 8:15 AM Etta Grandchild, MD [facility] [phone] [facility]   04/23/2012 10:45 AM Joycie Peek Hickerson [facility] [phone] None   04/30/2012 1:30 PM Dava Najjar Idelle Jo [facility] [phone] None   04/30/2012 2:00 PM Conni Slipper, PA [facility] [phone] None   04/30/2012 3:00 PM [facility] [phone] None   05/07/2012 1:00 PM Delcie Roch [facility] [phone] None   05/14/2012 11:00 AM Windell Hummingbird [facility] [phone] None   05/22/2012 12:45 PM  Devonne Doughty  R Perkins [facility] [phone] None   05/22/2012 1:15 PM [facility] [phone] None     Medication List  As of 04/20/2012  4:30 PM   STOP taking these medications         BYSTOLIC 20 MG Tabs      diltiazem 120 MG 24 hr capsule      losartan 50 MG tablet      PYRIDIUM PO         TAKE these medications         albuterol 108 (90 BASE) MCG/ACT inhaler   Commonly known as: PROVENTIL HFA;VENTOLIN HFA   Inhale 2 puffs into the lungs every 6 (six) hours as needed. For shortness of breath      budesonide-formoterol 160-4.5 MCG/ACT inhaler   Commonly known as: SYMBICORT   Inhale 2 puffs into the lungs 2 (two) times daily.      cephALEXin 500 MG capsule   Commonly known as: KEFLEX   Take 500 mg by mouth 2 (two) times daily. 7 day course of therapy started 04/14/12      cetirizine 10 MG tablet   Commonly known as: ZYRTEC   Take 10 mg by mouth daily as needed. For allergies      dexamethasone 4 MG tablet   Commonly known as: DECADRON   Take 4 mg by mouth See admin instructions. 1 tab BID day before of and after chemo      DULoxetine 60 MG capsule   Commonly known as: CYMBALTA   Take 60 mg by mouth daily.      ezetimibe 10 MG tablet   Commonly known as: ZETIA   Take 10 mg by mouth daily.      ferrous sulfate 325 (65 FE) MG tablet   Take 325 mg by mouth daily with breakfast.      folic acid 1 MG tablet   Commonly known as: FOLVITE   Take 1 mg by mouth daily.      guaiFENesin 600 MG 12 hr tablet   Commonly known as: MUCINEX   Take 600 mg by mouth 2 (two) times daily as needed. For congestion      lidocaine-prilocaine cream   Commonly known as: EMLA   Apply 1 application topically as needed. Apply to port 1 hour before chemo appointment      meclizine 25 MG tablet   Commonly known as: ANTIVERT   Take 25 mg by mouth 3 (three) times daily as needed. For dizziness.      megestrol 400 MG/10ML suspension   Commonly known as: MEGACE   Take 10  mLs (400 mg total) by mouth daily.      metFORMIN 500 MG tablet   Commonly known as: GLUCOPHAGE   Take 500 mg by mouth 2 (two) times daily with a meal.      metroNIDAZOLE 500 MG tablet   Commonly known as: FLAGYL   Take 1 tablet (500 mg total) by mouth every 6 (six) hours.      mulitivitamin with minerals Tabs   Take 1 tablet by mouth daily.      niacin 1000 MG CR tablet   Commonly known as: NIASPAN   Take 1,000 mg by mouth at bedtime.      PRESCRIPTION MEDICATION   Inject into the vein every 21 ( twenty-one) days. Alimta and Carboplatin every 3 weeks.      prochlorperazine 10 MG tablet   Commonly known as: COMPAZINE   Take 10  mg by mouth every 6 (six) hours as needed. For nausea      Risedronate Sodium 35 MG Tbec   Take 1 tablet by mouth once a week. Sundays      rosuvastatin 20 MG tablet   Commonly known as: CRESTOR   Take 20 mg by mouth daily.      TUDORZA PRESSAIR IN   Inhale 1 puff into the lungs 2 (two) times daily.      vitamin C 500 MG tablet   Commonly known as: ASCORBIC ACID   Take 500 mg by mouth 2 (two) times daily.             SignedConni Slipper PA-C 04/20/2012, 4:31 PM  Hematology/oncology attending: The patient is seen and examined. I agree with the above note. She is feeling better today. She would be discharged home with continuous treatment for C. Difficile on an outpatient basis. She has followup with [place] at the [facility] on June 13 before starting the next cycle of her chemotherapy.

## 2012-04-20 NOTE — Telephone Encounter (Signed)
I called pt daughter and told her pt is ready for discharge. Dtr can pick h er up at lunch today

## 2012-04-20 NOTE — Telephone Encounter (Signed)
I told dtr to pick pt up at 5p today. She voices understanding

## 2012-04-20 NOTE — Telephone Encounter (Signed)
This was taken care of last week 

## 2012-04-21 ENCOUNTER — Ambulatory Visit: Payer: Medicare Other

## 2012-04-21 ENCOUNTER — Other Ambulatory Visit: Payer: Self-pay | Admitting: Physician Assistant

## 2012-04-21 ENCOUNTER — Encounter: Payer: Self-pay | Admitting: Physician Assistant

## 2012-04-21 ENCOUNTER — Ambulatory Visit: Payer: Medicare Other | Admitting: Internal Medicine

## 2012-04-21 DIAGNOSIS — Z0289 Encounter for other administrative examinations: Secondary | ICD-10-CM

## 2012-04-21 NOTE — Progress Notes (Signed)
Addendum to discharge summary dated 04/20/2012: The admitting serum creatinine and serum potassium were based on laboratory data from her office visit of 04/16/2012. The serum creatinine was 2.48 and the serum potassium 2.9. Day of discharge the serum creatinine was improved at 1.33 and the serum potassium totally within normal limits at 3.8.  Laural Benes, Wen Munford E, PA-C

## 2012-04-23 ENCOUNTER — Ambulatory Visit: Payer: Medicare Other

## 2012-04-23 ENCOUNTER — Other Ambulatory Visit (HOSPITAL_BASED_OUTPATIENT_CLINIC_OR_DEPARTMENT_OTHER): Payer: Medicare Other | Admitting: Lab

## 2012-04-23 DIAGNOSIS — C3410 Malignant neoplasm of upper lobe, unspecified bronchus or lung: Secondary | ICD-10-CM

## 2012-04-23 LAB — CBC WITH DIFFERENTIAL/PLATELET
Eosinophils Absolute: 0 10*3/uL (ref 0.0–0.5)
LYMPH%: 55.5 % — ABNORMAL HIGH (ref 14.0–49.7)
MCH: 27.8 pg (ref 25.1–34.0)
MCV: 80.9 fL (ref 79.5–101.0)
MONO%: 14.6 % — ABNORMAL HIGH (ref 0.0–14.0)
NEUT#: 0.7 10*3/uL — ABNORMAL LOW (ref 1.5–6.5)
Platelets: 75 10*3/uL — ABNORMAL LOW (ref 145–400)
RBC: 4.39 10*6/uL (ref 3.70–5.45)
nRBC: 0 % (ref 0–0)

## 2012-04-23 LAB — COMPREHENSIVE METABOLIC PANEL
ALT: 55 U/L — ABNORMAL HIGH (ref 0–35)
CO2: 25 mEq/L (ref 19–32)
Creatinine, Ser: 1.37 mg/dL — ABNORMAL HIGH (ref 0.50–1.10)
Glucose, Bld: 112 mg/dL — ABNORMAL HIGH (ref 70–99)
Total Bilirubin: 0.6 mg/dL (ref 0.3–1.2)

## 2012-04-24 ENCOUNTER — Other Ambulatory Visit: Payer: Self-pay | Admitting: Medical Oncology

## 2012-04-24 ENCOUNTER — Telehealth: Payer: Self-pay | Admitting: Medical Oncology

## 2012-04-24 DIAGNOSIS — E876 Hypokalemia: Secondary | ICD-10-CM

## 2012-04-24 DIAGNOSIS — C3410 Malignant neoplasm of upper lobe, unspecified bronchus or lung: Secondary | ICD-10-CM

## 2012-04-24 MED ORDER — POTASSIUM CHLORIDE CRYS ER 20 MEQ PO TBCR: 40.0000 meq | EXTENDED_RELEASE_TABLET | Freq: Every day | ORAL | Status: DC

## 2012-04-24 NOTE — Telephone Encounter (Signed)
Message copied by Charma Igo on Fri Apr 24, 2012 12:34 PM ------      Message from: Si Gaul      Created: Fri Apr 24, 2012 11:10 AM       Call patient with the result and order K Dur 20 meq po qd X 7

## 2012-04-24 NOTE — Telephone Encounter (Signed)
Spoke to [place] and told her per Adrena that Veronica Duarte's  LFTs are mildly elevated and the chemo drugs can cause this . I recommended to   [name] to talk to cardiologist about pt other drugs and effect on LFTs. I moved pt lab and provider appointment to wed 6/12 and kept chemo appointment on Thursday . Daughter notified . I added Magnesium to labs per Adrena.

## 2012-04-24 NOTE — Telephone Encounter (Signed)
Spoke to daughter [name] and she requested copy of labs -She asked me to please verify dose of potassium for her mother. I told her I will f/u with dr Donnald Garre or Dimas [place].  Potassium dose changed to 40 meq po day x 7 and called to pt pharmacy. [name] requested call from [place] regarding labs. I told her I will notify Adrena.

## 2012-04-24 NOTE — Progress Notes (Signed)
Quick Note:  Call patient with the result and order K Dur 20 meq po qd X 7 ______ 

## 2012-04-28 ENCOUNTER — Encounter: Payer: Self-pay | Admitting: Internal Medicine

## 2012-04-28 ENCOUNTER — Ambulatory Visit (INDEPENDENT_AMBULATORY_CARE_PROVIDER_SITE_OTHER): Payer: Medicare Other | Admitting: Internal Medicine

## 2012-04-28 VITALS — BP 130/66 | HR 86 | Temp 98.2°F | Resp 16 | Wt 193.8 lb

## 2012-04-28 DIAGNOSIS — F329 Major depressive disorder, single episode, unspecified: Secondary | ICD-10-CM

## 2012-04-28 DIAGNOSIS — E119 Type 2 diabetes mellitus without complications: Secondary | ICD-10-CM

## 2012-04-28 DIAGNOSIS — C3410 Malignant neoplasm of upper lobe, unspecified bronchus or lung: Secondary | ICD-10-CM

## 2012-04-28 MED ORDER — BUPROPION HCL ER (XL) 150 MG PO TB24: 150.0000 mg | ORAL_TABLET | Freq: Every day | ORAL | Status: DC

## 2012-04-28 NOTE — Anesthesia Postprocedure Evaluation (Signed)
  Anesthesia Post-op Note  Patient: Veronica Duarte  Procedure(s) Performed: Procedure(s) (LRB): MEDIASTINOSCOPY (N/A) INSERTION PORT-A-CATH (Left)  Patient Location: PACU  Anesthesia Type: General  Level of Consciousness: awake and alert   Airway and Oxygen Therapy: Patient Spontanous Breathing  Post-op Pain: mild  Post-op Assessment: Post-op Vital signs reviewed and Patient's Cardiovascular Status Stable  Post-op Vital Signs: stable  Complications: No apparent anesthesia complications

## 2012-04-28 NOTE — Patient Instructions (Signed)
Diabetes, Type 2 Diabetes is a long-lasting (chronic) disease. In type 2 diabetes, the pancreas does not make enough insulin (a hormone), and the body does not respond normally to the insulin that is made. This type of diabetes was also previously called adult-onset diabetes. It usually occurs after the age of 99, but it can occur at any age.  CAUSES  Type 2 diabetes happens because the pancreasis not making enough insulin or your body has trouble using the insulin that your pancreas does make properly. SYMPTOMS   Drinking more than usual.   Urinating more than usual.   Blurred vision.   Dry, itchy skin.   Frequent infections.   Feeling more tired than usual (fatigue).  DIAGNOSIS The diagnosis of type 2 diabetes is usually made by one of the following tests:  Fasting blood glucose test. You will not eat for at least 8 hours and then take a blood test.   Random blood glucose test. Your blood glucose (sugar) is checked at any time of the day regardless of when you ate.   Oral glucose tolerance test (OGTT). Your blood glucose is measured after you have not eaten (fasted) and then after you drink a glucose containing beverage.  TREATMENT   Healthy eating.   Exercise.   Medicine, if needed.   Monitoring blood glucose.   Seeing your caregiver regularly.  HOME CARE INSTRUCTIONS   Check your blood glucose at least once a day. More frequent monitoring may be necessary, depending on your medicines and on how well your diabetes is controlled. Your caregiver will advise you.   Take your medicine as directed by your caregiver.   Do not smoke.   Make wise food choices. Ask your caregiver for information. Weight loss can improve your diabetes.   Learn about low blood glucose (hypoglycemia) and how to treat it.   Get your eyes checked regularly.   Have a yearly physical exam. Have your blood pressure checked and your blood and urine tested.   Wear a pendant or bracelet saying  that you have diabetes.   Check your feet every night for cuts, sores, blisters, and redness. Let your caregiver know if you have any problems.  SEEK MEDICAL CARE IF:   You have problems keeping your blood glucose in target range.   You have problems with your medicines.   You have symptoms of an illness that do not improve after 24 hours.   You have a sore or wound that is not healing.   You notice a change in vision or a new problem with your vision.   You have a fever.  MAKE SURE YOU:  Understand these instructions.   Will watch your condition.   Will get help right away if you are not doing well or get worse.  Document Released: 11/04/2005 Document Revised: 10/24/2011 Document Reviewed: 04/22/2011 [facility] Patient Information 2012 [place], [place].Depression, Adolescent and Adult Depression is a true and treatable medical condition. In general there are two kinds of depression:  Depression we all experience in some form. For example depression from the death of a loved one, financial distress or natural disasters will trigger or increase depression.   Clinical depression, on the other hand, appears without an apparent cause or reason. This depression is a disease. Depression may be caused by chemical imbalance in the body and brain or may come as a response to a physical illness. Alcohol and other drugs can cause depression.  DIAGNOSIS  The diagnosis of depression is  usually based upon symptoms and medical history. TREATMENT  Treatments for depression fall into three categories. These are:  Drug therapy. There are many medicines that treat depression. Responses may vary and sometimes trial and error is necessary to determine the best medicines and dosage for a particular patient.   Psychotherapy, also called talking treatments, helps people resolve their problems by looking at them from a different point of view and by giving people insight into their own personal makeup.  Traditional psychotherapy looks at a childhood source of a problem. Other psychotherapy will look at current conflicts and move toward solving those. If the cause of depression is drug use, counseling is available to help abstain. In time the depression will usually improve. If there were underlying causes for the chemical use, they can be addressed.   ECT (electroconvulsive therapy) or shock treatment is not as commonly used today. It is a very effective treatment for severe suicidal depression. During ECT electrical impulses are applied to the head. These impulses cause a generalized seizure. It can be effective but causes a loss of memory for recent events. Sometimes this loss of memory may include the last several months.  Treat all depression or suicide threats as serious. Obtain professional help. Do not wait to see if serious depression will get better over time without help. Seek help for yourself or those around you. In the [place] the number to the National Suicide Help Lines With 24 Hour Help Are: 1-800-SUICIDE (201) 038-4264 Document Released: 11/01/2000 Document Revised: 10/24/2011 Document Reviewed: 06/22/2008 [facility] Patient Information 2012 [place], [place].

## 2012-04-29 ENCOUNTER — Ambulatory Visit (HOSPITAL_BASED_OUTPATIENT_CLINIC_OR_DEPARTMENT_OTHER): Payer: Medicare Other | Admitting: Internal Medicine

## 2012-04-29 ENCOUNTER — Other Ambulatory Visit (HOSPITAL_BASED_OUTPATIENT_CLINIC_OR_DEPARTMENT_OTHER): Payer: Medicare Other | Admitting: Lab

## 2012-04-29 ENCOUNTER — Telehealth: Payer: Self-pay | Admitting: Internal Medicine

## 2012-04-29 VITALS — BP 128/82 | HR 83 | Temp 98.9°F | Ht 64.0 in | Wt 193.5 lb

## 2012-04-29 DIAGNOSIS — C3410 Malignant neoplasm of upper lobe, unspecified bronchus or lung: Secondary | ICD-10-CM

## 2012-04-29 DIAGNOSIS — C3490 Malignant neoplasm of unspecified part of unspecified bronchus or lung: Secondary | ICD-10-CM

## 2012-04-29 LAB — CBC WITH DIFFERENTIAL/PLATELET
Basophils Absolute: 0 10*3/uL (ref 0.0–0.1)
Eosinophils Absolute: 0 10*3/uL (ref 0.0–0.5)
HCT: 35.4 % (ref 34.8–46.6)
LYMPH%: 35.7 % (ref 14.0–49.7)
MONO#: 0.9 10*3/uL (ref 0.1–0.9)
NEUT#: 3.5 10*3/uL (ref 1.5–6.5)
NEUT%: 51.3 % (ref 38.4–76.8)
Platelets: 340 10*3/uL (ref 145–400)
WBC: 6.9 10*3/uL (ref 3.9–10.3)

## 2012-04-29 LAB — COMPREHENSIVE METABOLIC PANEL
CO2: 23 mEq/L (ref 19–32)
Creatinine, Ser: 1.01 mg/dL (ref 0.50–1.10)
Glucose, Bld: 112 mg/dL — ABNORMAL HIGH (ref 70–99)
Total Bilirubin: 0.4 mg/dL (ref 0.3–1.2)
Total Protein: 5.7 g/dL — ABNORMAL LOW (ref 6.0–8.3)

## 2012-04-29 LAB — MAGNESIUM: Magnesium: 0.6 mg/dL — ABNORMAL LOW (ref 1.5–2.5)

## 2012-04-29 MED ORDER — POTASSIUM CHLORIDE CRYS ER 20 MEQ PO TBCR: 20.0000 meq | EXTENDED_RELEASE_TABLET | ORAL | Status: DC

## 2012-04-29 MED ORDER — MAGNESIUM OXIDE 400 (241.3 MG) MG PO TABS: 400.0000 mg | ORAL_TABLET | Freq: Three times a day (TID) | ORAL | Status: DC

## 2012-04-29 NOTE — Assessment & Plan Note (Signed)
She has recurrent, apparently metastatic disease per her daughter, she has had trouble with po intake recently so I think it is best for her to stop her med for osteoporosis

## 2012-04-29 NOTE — Progress Notes (Signed)
Subjective:    Patient ID: Veronica Duarte, female    DOB: 1946-10-26, 66 y.o.   MRN: [id number]  Diabetes She presents for her follow-up diabetic visit. She has type 2 diabetes mellitus. Her disease course has been stable. There are no hypoglycemic associated symptoms. Pertinent negatives for hypoglycemia include no confusion, dizziness, headaches, nervousness/anxiousness, pallor, seizures, speech difficulty or tremors. Pertinent negatives for diabetes include no blurred vision, no chest pain, no fatigue, no foot paresthesias, no foot ulcerations, no polydipsia, no polyphagia, no polyuria, no visual change, no weakness and no weight loss. There are no hypoglycemic complications. There are no diabetic complications. Her weight is stable. She is following a generally unhealthy diet. When asked about meal planning, she reported none. She never participates in exercise. Her breakfast blood glucose range is generally 110-130 mg/dl. Her lunch blood glucose range is generally 130-140 mg/dl. Her dinner blood glucose range is generally 130-140 mg/dl. Her highest blood glucose is 140-180 mg/dl. Her overall blood glucose range is 130-140 mg/dl. She does not see a podiatrist.Eye exam is current.      Review of Systems  Constitutional: Positive for appetite change (she is on chemo and that has decreased her appetite). Negative for fever, chills, weight loss, diaphoresis, activity change, fatigue and unexpected weight change.  HENT: Negative.   Eyes: Negative.  Negative for blurred vision.  Respiratory: Negative for cough, chest tightness, shortness of breath, wheezing and stridor.   Cardiovascular: Negative for chest pain, palpitations and leg swelling.  Gastrointestinal: Positive for nausea. Negative for vomiting, abdominal pain, diarrhea, constipation, blood in stool, abdominal distention, anal bleeding and rectal pain.  Genitourinary: Negative.  Negative for polyuria.  Musculoskeletal: Negative for myalgias,  back pain, joint swelling, arthralgias and gait problem.  Skin: Negative for color change, pallor, rash and wound.  Neurological: Negative for dizziness, tremors, seizures, syncope, facial asymmetry, speech difficulty, weakness, light-headedness, numbness and headaches.  Hematological: Negative for polydipsia, polyphagia and adenopathy. Does not bruise/bleed easily.  Psychiatric/Behavioral: Positive for dysphoric mood (anhedonia, lethargy, excessive sleep, apathy,) and decreased concentration. Negative for suicidal ideas, hallucinations, behavioral problems, confusion, disturbed wake/sleep cycle, self-injury and agitation. The patient is not nervous/anxious and is not hyperactive.        Objective:   Physical Exam  Vitals reviewed. Constitutional: She is oriented to person, place, and time. She appears well-developed and well-nourished. No distress.  HENT:  Head: Normocephalic and atraumatic.  Mouth/Throat: Oropharynx is clear and moist. No oropharyngeal exudate.  Eyes: Conjunctivae are normal. Right eye exhibits no discharge. Left eye exhibits no discharge. No scleral icterus.  Neck: Normal range of motion. Neck supple. No JVD present. No tracheal deviation present. No thyromegaly present.  Cardiovascular: Normal rate, regular rhythm, normal heart sounds and intact distal pulses.  Exam reveals no gallop and no friction rub.   No murmur heard. Pulmonary/Chest: Effort normal and breath sounds normal. No stridor. No respiratory distress. She has no wheezes. She has no rales. She exhibits no tenderness.  Abdominal: Soft. Bowel sounds are normal. She exhibits no distension and no mass. There is no tenderness. There is no rebound and no guarding.  Musculoskeletal: Normal range of motion. She exhibits no edema and no tenderness.  Lymphadenopathy:    She has no cervical adenopathy.  Neurological: She is oriented to person, place, and time.  Skin: Skin is warm and dry. No rash noted. She is not  diaphoretic. No erythema. No pallor.  Psychiatric: Judgment and thought content normal. Her mood appears not anxious.  Her affect is not angry, not blunt, not labile and not inappropriate. Her speech is not rapid and/or pressured, not delayed, not tangential and not slurred. She is slowed and withdrawn. She is not agitated, not aggressive, is not hyperactive, not actively hallucinating and not combative. Thought content is not paranoid and not delusional. Cognition and memory are normal. Cognition and memory are not impaired. She does not express impulsivity or inappropriate judgment. She exhibits a depressed mood. She expresses no homicidal and no suicidal ideation. She expresses no suicidal plans and no homicidal plans. She is communicative. She exhibits normal recent memory and normal remote memory. She is inattentive.          Assessment & Plan:

## 2012-04-29 NOTE — Assessment & Plan Note (Signed)
She is on dexamethasone therapy for lung cancer and that raises her BS to 160-170 during those days, I told them not to treat these numbers at this time, the overall picture with her lung cancer is the prominent issue now and that the BS does not need to be treated so tightly

## 2012-04-29 NOTE — Telephone Encounter (Signed)
Gave pt appt for June and July 2013 lab ,ML chemo

## 2012-04-29 NOTE — Assessment & Plan Note (Signed)
I have asked her to add wellbutrin to the cymbalta in the hopes that some support of dopamine will help her feel better

## 2012-04-29 NOTE — Progress Notes (Signed)
[facility] [phone]   [phone]  OFFICE PROGRESS NOTE  [name], MD [address] [facility] [address] [place], [address] [place] [address]  PRINCIPAL DIAGNOSIS: Metastatic non-small cell lung cancer, adenocarcinoma initially diagnosed as stage IB (T2a, N0, N0) in March 2012.   PRIOR THERAPY: Status post wedge resection of the right upper lobe under the care of Dr. Laneta Simmers on February 14, 2011.   CURRENT THERAPY: Systemic chemotherapy with carboplatin for AUC of 5 and Alimta 500 mg/M2 every 3 weeks, the patient is status post 1 cycle.   INTERVAL HISTORY: Veronica Duarte 66 y.o. female returns to the clinic today for followup visit accompanied by her daughter Veronica Duarte. The patient was recently admitted to [facility] with Acute renal failure, dehydration and hypokalemia. She received IV hydration for 2 days and the patient felt much better and was discharged home recently. She did fine since her discharge. She is eating better and ambulatory at home. She is here today to start the second cycle of her systemic chemotherapy with carboplatin and Alimta. She has no current fever or chills, no nausea or vomiting. She has no chest pain or shortness of breath.  MEDICAL HISTORY: Past Medical History  Diagnosis Date  . Diabetes mellitus     type 2- metformin  . ADENOCARCINOMA, RIGHT LUNG, UPPER LOBE 02/04/2011  . Depression   . RAS (renal artery stenosis)   . AAA (abdominal aortic aneurysm)   . Osteoarthritis   . COPD (chronic obstructive pulmonary disease)   . Hx of colonic polyps   . CAD (coronary artery disease)   . OSA (obstructive sleep apnea)     Uses CPAP pressure is 6 study was done > 5 years  . Hypertension     Sees [facility] Tresa Endo with [facility]    ALLERGIES:   has no known allergies.  MEDICATIONS:  Current Outpatient Prescriptions  Medication Sig Dispense Refill  . Aclidinium Bromide (TUDORZA PRESSAIR IN) Inhale 1 puff into the lungs 2  (two) times daily.      Marland Kitchen albuterol (PROVENTIL HFA;VENTOLIN HFA) 108 (90 BASE) MCG/ACT inhaler Inhale 2 puffs into the lungs every 6 (six) hours as needed. For shortness of breath      . budesonide-formoterol (SYMBICORT) 160-4.5 MCG/ACT inhaler Inhale 2 puffs into the lungs 2 (two) times daily.      Marland Kitchen buPROPion (WELLBUTRIN XL) 150 MG 24 hr tablet Take 1 tablet (150 mg total) by mouth daily.  90 tablet  3  . cetirizine (ZYRTEC) 10 MG tablet Take 10 mg by mouth daily as needed. For allergies      . dexamethasone (DECADRON) 4 MG tablet Take 4 mg by mouth See admin instructions. 1 tab BID day before of and after chemo      . diltiazem (CARDIZEM) 120 MG tablet Take 120 mg by mouth daily.       . DULoxetine (CYMBALTA) 60 MG capsule Take 60 mg by mouth daily.      . ferrous sulfate 325 (65 FE) MG tablet Take 325 mg by mouth daily with breakfast.       . folic acid (FOLVITE) 1 MG tablet Take 1 mg by mouth daily.      Marland Kitchen guaiFENesin (MUCINEX) 600 MG 12 hr tablet Take 600 mg by mouth 2 (two) times daily as needed. For congestion      . lidocaine-prilocaine (EMLA) cream Apply 1 application topically as needed. Apply to port  1 hour before chemo appointment      . meclizine (ANTIVERT) 25 MG tablet Take 25 mg by mouth 3 (three) times daily as needed. For dizziness.      . megestrol (MEGACE) 400 MG/10ML suspension Take 10 mLs (400 mg total) by mouth daily.  240 mL  0  . metFORMIN (GLUCOPHAGE) 500 MG tablet Take 500 mg by mouth 2 (two) times daily with a meal.      . metroNIDAZOLE (FLAGYL) 500 MG tablet Take 1 tablet (500 mg total) by mouth every 6 (six) hours.  50 tablet  0  . Multiple Vitamin (MULITIVITAMIN WITH MINERALS) TABS Take 1 tablet by mouth daily.      . nebivolol (BYSTOLIC) 10 MG tablet Take 10 mg by mouth daily.      . niacin (NIASPAN) 1000 MG CR tablet Take 1,000 mg by mouth at bedtime.       . potassium chloride SA (K-DUR,KLOR-CON) 20 MEQ tablet Take 2 tablets (40 mEq total) by mouth daily.  7  tablet  0  . PRESCRIPTION MEDICATION Inject into the vein every 21 ( twenty-one) days. Alimta and Carboplatin every 3 weeks.      . prochlorperazine (COMPAZINE) 10 MG tablet Take 10 mg by mouth every 6 (six) hours as needed. For nausea      . vitamin C (ASCORBIC ACID) 500 MG tablet Take 500 mg by mouth 2 (two) times daily.         SURGICAL HISTORY:  Past Surgical History  Procedure Date  . 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 Vesicovaginal fistula closure w/ tah   . Abdominal hysterectomy   . Right video-assisted thoracoscopy with wedge resection of 02/14/11    Dr Evelene Croon  . Cardiac catheterization 08/30/2009    Dr Aleen Campi  . Intraoperative transesophageal echocardiography. 08/30/2009    Dr Kipp Brood  . Mediastinoscopy 04/03/2012    Procedure: MEDIASTINOSCOPY;  Surgeon: Alleen Borne, MD;  Location: [facility] OR;  Service: Thoracic;  Laterality: N/A;  . Portacath placement 04/03/2012    Procedure: INSERTION PORT-A-CATH;  Surgeon: Alleen Borne, MD;  Location: [facility] OR;  Service: Thoracic;  Laterality: Left;  Insertion of 9.6Fr. Pre-attached power port in Left Subclavian    REVIEW OF SYSTEMS:  A comprehensive review of systems was negative except for: Constitutional: positive for fatigue   PHYSICAL EXAMINATION: General appearance: alert, cooperative and no distress Head: Normocephalic, without obvious abnormality, atraumatic Neck: no adenopathy Lymph nodes: Cervical, supraclavicular, and axillary nodes normal. Resp: clear to auscultation bilaterally Cardio: regular rate and rhythm, S1, S2 normal, no murmur, click, rub or gallop GI: soft, non-tender; bowel sounds normal; no masses,  no organomegaly Extremities: extremities normal, atraumatic, no cyanosis or edema Neurologic: Alert and oriented X 3, normal strength and tone. Normal symmetric reflexes. Normal coordination and gait  ECOG PERFORMANCE STATUS: 1 - Symptomatic but  completely ambulatory  Blood pressure 128/82, pulse 83, temperature 98.9 F (37.2 C), temperature source Oral, height 5\' 4"  (1.626 m), weight 193 lb 8 oz (87.771 kg).  LABORATORY DATA: Lab Results  Component Value Date   WBC 6.9 04/29/2012   HGB 12.2 04/29/2012   HCT 35.4 04/29/2012   MCV 83.3 04/29/2012   PLT 340 04/29/2012      Chemistry      Component Value Date/Time   NA 143 04/23/2012 1120   NA 145 09/06/2011 0902   K 2.9* 04/23/2012 1120  K 3.9 09/06/2011 0902   CL 105 04/23/2012 1120   CL 100 09/06/2011 0902   CO2 25 04/23/2012 1120   CO2 26 09/06/2011 0902   BUN 8 04/23/2012 1120   BUN 12 09/06/2011 0902   CREATININE 1.37* 04/23/2012 1120   CREATININE 0.5* 09/06/2011 0902      Component Value Date/Time   CALCIUM 8.3* 04/23/2012 1120   CALCIUM 9.5 09/06/2011 0902   ALKPHOS 45 04/23/2012 1120   ALKPHOS 55 09/06/2011 0902   AST 57* 04/23/2012 1120   AST 24 09/06/2011 0902   ALT 55* 04/23/2012 1120   BILITOT 0.6 04/23/2012 1120   BILITOT 0.70 09/06/2011 0902       RADIOGRAPHIC STUDIES: X-ray Chest Pa And Lateral   04/17/2012  *RADIOLOGY REPORT*  Clinical Data: Productive cough, COPD  CHEST - 2 VIEW  Comparison: 04/03/2012  Findings: 11 mm nodule in the right mid lung.  Additional known right pulmonary nodule is not radiographically apparent.  Chronic interstitial markings/emphysematous changes.  The heart is normal in size.  Postsurgical changes related to prior CABG.  Mild prominence of the right paratracheal region/hilum, corresponding to known adenopathy.  Left chest port.  IMPRESSION: 11 mm nodule in the right mid lung.  Additional known right upper lobe nodule is not radiographically apparent.  Prominence of the right paratracheal region/hilum, corresponding to known adenopathy.  Original Report Authenticated By: Charline Bills, M.D.   Dg Chest 2 View Within Previous 72 Hours.  Films Obtained On Friday Are Acceptable For Monday And Tuesday Cases  04/02/2012  *RADIOLOGY REPORT*   Clinical Data: Preop lung cancer  CHEST - 2 VIEW  Comparison: PET CT scan 03/06/2038  Findings: Sternotomy wires overlie stable cardiac silhouette. Nodularity in the left upper lobe is unchanged.   No evidence of pulmonary edema or pneumothorax.  Surgical staples in the right upper lobe.  IMPRESSION: . 1.  Stable postoperative findings the right upper lobe.  2.   Stable right lung nodularity. 3.  No acute findings.  Original Report Authenticated By: Genevive Bi, M.D.   Dg Chest Port 1 View  04/03/2012  *RADIOLOGY REPORT*  Clinical Data: Post mediastinoscopy and Port-A-Cath insertion  PORTABLE CHEST - 1 VIEW  Comparison: Portable exam 1435 hours compared to 04/02/2012  Findings: New left subclavian Port-A-Cath, tip projecting over SVC. Normal-sized cardiac silhouette post CABG. Calcified tortuous aorta. Pulmonary vascularity normal. Emphysematous changes with bibasilar atelectasis. No infiltrate, pleural effusion or pneumothorax. No acute osseous findings.  IMPRESSION: No pneumothorax following Port-A-Cath insertion. Emphysematous changes with bibasilar atelectasis. Post CABG.  Original Report Authenticated By: Lollie Marrow, M.D.   Dg Fluoro Guide Cv Line-no Report  04/03/2012  CLINICAL DATA: Port a cath   FLOURO GUIDE CV LINE  Fluoroscopy was utilized by the requesting physician.  No radiographic  interpretation.      ASSESSMENT: This is a very pleasant 66 years old white female with metastatic non-small cell lung cancer, adenocarcinoma. The patient is currently undergoing systemic chemotherapy with carboplatin and Alimta,  Status post 1 cycle. She has rough time with the first cycle of her treatment with poor appetite, nausea, poor po mouth intake and dehydration  PLAN: We will resume cycle #2 of carboplatin and Alimta today but at a reduced dose because of intolerance. We will monitor her electrolytes closely and replenish it as needed.  The patient would come back for followup visit in 3 weeks  with the start of the next cycle of her chemotherapy. She was advised to  call me immediately if she has any concerning symptoms in the interval.  All questions were answered. The patient knows to call the clinic with any problems, questions or concerns. We can certainly see the patient much sooner if necessary.  I spent 20 minutes counseling the patient face to face. The total time spent in the appointment was 30 minutes.

## 2012-04-30 ENCOUNTER — Ambulatory Visit: Payer: Medicare Other

## 2012-04-30 ENCOUNTER — Other Ambulatory Visit: Payer: Medicare Other | Admitting: Lab

## 2012-04-30 ENCOUNTER — Ambulatory Visit: Payer: Medicare Other | Admitting: Physician Assistant

## 2012-04-30 ENCOUNTER — Encounter: Payer: Self-pay | Admitting: *Deleted

## 2012-04-30 ENCOUNTER — Ambulatory Visit (HOSPITAL_BASED_OUTPATIENT_CLINIC_OR_DEPARTMENT_OTHER): Payer: Medicare Other

## 2012-04-30 VITALS — BP 133/73 | HR 87 | Temp 98.7°F

## 2012-04-30 DIAGNOSIS — Z5111 Encounter for antineoplastic chemotherapy: Secondary | ICD-10-CM

## 2012-04-30 DIAGNOSIS — C3410 Malignant neoplasm of upper lobe, unspecified bronchus or lung: Secondary | ICD-10-CM

## 2012-04-30 MED ORDER — DEXAMETHASONE SODIUM PHOSPHATE 4 MG/ML IJ SOLN
20.0000 mg | Freq: Once | INTRAMUSCULAR | Status: AC
  Administered 2012-04-30: 20 mg via INTRAVENOUS

## 2012-04-30 MED ORDER — SODIUM CHLORIDE 0.9 % IV SOLN
329.2000 mg | Freq: Once | INTRAVENOUS | Status: AC
  Administered 2012-04-30: 330 mg via INTRAVENOUS
  Filled 2012-04-30: qty 33

## 2012-04-30 MED ORDER — ONDANSETRON 16 MG/50ML IVPB (CHCC)
16.0000 mg | Freq: Once | INTRAVENOUS | Status: AC
  Administered 2012-04-30: 16 mg via INTRAVENOUS

## 2012-04-30 MED ORDER — SODIUM CHLORIDE 0.9 % IV SOLN
375.0000 mg/m2 | Freq: Once | INTRAVENOUS | Status: AC
  Administered 2012-04-30: 775 mg via INTRAVENOUS
  Filled 2012-04-30: qty 31

## 2012-04-30 MED ORDER — SODIUM CHLORIDE 0.9 % IV SOLN: Freq: Once | INTRAVENOUS | Status: DC

## 2012-04-30 MED ORDER — HEPARIN SOD (PORK) LOCK FLUSH 100 UNIT/ML IV SOLN
500.0000 [IU] | Freq: Once | INTRAVENOUS | Status: AC | PRN
  Administered 2012-04-30: 500 [IU]
  Filled 2012-04-30: qty 5

## 2012-04-30 MED ORDER — SODIUM CHLORIDE 0.9 % IJ SOLN
10.0000 mL | INTRAMUSCULAR | Status: DC | PRN
  Administered 2012-04-30: 10 mL
  Filled 2012-04-30: qty 10

## 2012-04-30 NOTE — Progress Notes (Signed)
Late Entry 04/29/12:  K 2.9, Magnesium 0.6.    Per Dr Arbutus Ped, pt needs to take daily then resume daily of K-dur.  Pt also needs to begin taking 400mg  TID magnesium oxide.  Called and gave instructions to Santina Evans, she verbalized understanding.  SLJ

## 2012-05-04 ENCOUNTER — Ambulatory Visit: Payer: Medicare Other | Admitting: Internal Medicine

## 2012-05-07 ENCOUNTER — Other Ambulatory Visit (HOSPITAL_BASED_OUTPATIENT_CLINIC_OR_DEPARTMENT_OTHER): Payer: Medicare Other | Admitting: Lab

## 2012-05-07 ENCOUNTER — Ambulatory Visit: Payer: Medicare Other

## 2012-05-07 DIAGNOSIS — C3490 Malignant neoplasm of unspecified part of unspecified bronchus or lung: Secondary | ICD-10-CM

## 2012-05-07 DIAGNOSIS — C3410 Malignant neoplasm of upper lobe, unspecified bronchus or lung: Secondary | ICD-10-CM

## 2012-05-07 LAB — CBC WITH DIFFERENTIAL/PLATELET
BASO%: 0.2 % (ref 0.0–2.0)
HCT: 35.6 % (ref 34.8–46.6)
HGB: 11.9 g/dL (ref 11.6–15.9)
MCHC: 33.3 g/dL (ref 31.5–36.0)
MONO#: 0.1 10*3/uL (ref 0.1–0.9)
NEUT%: 60.6 % (ref 38.4–76.8)
WBC: 3.9 10*3/uL (ref 3.9–10.3)
lymph#: 1.5 10*3/uL (ref 0.9–3.3)

## 2012-05-08 ENCOUNTER — Other Ambulatory Visit: Payer: Medicare Other | Admitting: Lab

## 2012-05-08 ENCOUNTER — Ambulatory Visit (HOSPITAL_BASED_OUTPATIENT_CLINIC_OR_DEPARTMENT_OTHER): Payer: Medicare Other

## 2012-05-08 ENCOUNTER — Telehealth: Payer: Self-pay | Admitting: Medical Oncology

## 2012-05-08 ENCOUNTER — Other Ambulatory Visit: Payer: Self-pay | Admitting: Internal Medicine

## 2012-05-08 DIAGNOSIS — C3410 Malignant neoplasm of upper lobe, unspecified bronchus or lung: Secondary | ICD-10-CM

## 2012-05-08 LAB — CBC WITH DIFFERENTIAL/PLATELET
BASO%: 0.3 % (ref 0.0–2.0)
EOS%: 0 % (ref 0.0–7.0)
HCT: 35 % (ref 34.8–46.6)
MCHC: 33.5 g/dL (ref 31.5–36.0)
MONO#: 0.3 10*3/uL (ref 0.1–0.9)
RBC: 4.1 10*6/uL (ref 3.70–5.45)
RDW: 14.5 % (ref 11.2–14.5)
WBC: 4.5 10*3/uL (ref 3.9–10.3)
lymph#: 2.2 10*3/uL (ref 0.9–3.3)

## 2012-05-08 LAB — COMPREHENSIVE METABOLIC PANEL
ALT: 32 U/L (ref 0–35)
Albumin: 3.7 g/dL (ref 3.5–5.2)
CO2: 22 mEq/L (ref 19–32)
Calcium: 9.8 mg/dL (ref 8.4–10.5)
Chloride: 105 mEq/L (ref 96–112)
Creatinine, Ser: 0.96 mg/dL (ref 0.50–1.10)
Potassium: 4.3 mEq/L (ref 3.5–5.3)
Total Protein: 6.8 g/dL (ref 6.0–8.3)

## 2012-05-08 LAB — MAGNESIUM: Magnesium: 1.6 mg/dL (ref 1.5–2.5)

## 2012-05-08 NOTE — Telephone Encounter (Signed)
Labs faxed to [facility]

## 2012-05-08 NOTE — Telephone Encounter (Signed)
Pt taking mag oxide 400 mg tid

## 2012-05-08 NOTE — Telephone Encounter (Signed)
Asked dtr to bring pt in today for CMET . Appointment entered and lab ordered.

## 2012-05-11 ENCOUNTER — Other Ambulatory Visit: Payer: Self-pay | Admitting: Physician Assistant

## 2012-05-14 ENCOUNTER — Other Ambulatory Visit: Payer: Medicare Other | Admitting: Lab

## 2012-05-14 ENCOUNTER — Ambulatory Visit: Payer: Medicare Other

## 2012-05-14 DIAGNOSIS — C3410 Malignant neoplasm of upper lobe, unspecified bronchus or lung: Secondary | ICD-10-CM

## 2012-05-14 LAB — COMPREHENSIVE METABOLIC PANEL
AST: 24 U/L (ref 0–37)
Albumin: 4 g/dL (ref 3.5–5.2)
Alkaline Phosphatase: 43 U/L (ref 39–117)
BUN: 11 mg/dL (ref 6–23)
Calcium: 9.5 mg/dL (ref 8.4–10.5)
Chloride: 105 mEq/L (ref 96–112)
Glucose, Bld: 118 mg/dL — ABNORMAL HIGH (ref 70–99)
Potassium: 4 mEq/L (ref 3.5–5.3)
Sodium: 140 mEq/L (ref 135–145)
Total Protein: 6.2 g/dL (ref 6.0–8.3)

## 2012-05-14 LAB — CBC WITH DIFFERENTIAL/PLATELET
Basophils Absolute: 0 10*3/uL (ref 0.0–0.1)
Eosinophils Absolute: 0 10*3/uL (ref 0.0–0.5)
HGB: 11.5 g/dL — ABNORMAL LOW (ref 11.6–15.9)
NEUT#: 1.5 10*3/uL (ref 1.5–6.5)
RBC: 4.01 10*6/uL (ref 3.70–5.45)
RDW: 15.1 % — ABNORMAL HIGH (ref 11.2–14.5)
WBC: 4.1 10*3/uL (ref 3.9–10.3)
lymph#: 1.9 10*3/uL (ref 0.9–3.3)

## 2012-05-20 ENCOUNTER — Encounter: Payer: Self-pay | Admitting: *Deleted

## 2012-05-20 ENCOUNTER — Ambulatory Visit (HOSPITAL_BASED_OUTPATIENT_CLINIC_OR_DEPARTMENT_OTHER): Payer: Medicare Other

## 2012-05-20 ENCOUNTER — Other Ambulatory Visit (HOSPITAL_BASED_OUTPATIENT_CLINIC_OR_DEPARTMENT_OTHER): Payer: Medicare Other

## 2012-05-20 ENCOUNTER — Telehealth: Payer: Self-pay | Admitting: Internal Medicine

## 2012-05-20 ENCOUNTER — Encounter: Payer: Self-pay | Admitting: Physician Assistant

## 2012-05-20 ENCOUNTER — Ambulatory Visit (HOSPITAL_BASED_OUTPATIENT_CLINIC_OR_DEPARTMENT_OTHER): Payer: Medicare Other | Admitting: Physician Assistant

## 2012-05-20 VITALS — BP 147/82 | HR 88 | Temp 97.2°F | Ht 64.0 in | Wt 182.1 lb

## 2012-05-20 DIAGNOSIS — C3410 Malignant neoplasm of upper lobe, unspecified bronchus or lung: Secondary | ICD-10-CM

## 2012-05-20 DIAGNOSIS — Z5111 Encounter for antineoplastic chemotherapy: Secondary | ICD-10-CM

## 2012-05-20 LAB — COMPREHENSIVE METABOLIC PANEL
ALT: 23 U/L (ref 0–35)
AST: 21 U/L (ref 0–37)
Albumin: 3.6 g/dL (ref 3.5–5.2)
Alkaline Phosphatase: 48 U/L (ref 39–117)
Potassium: 4.6 mEq/L (ref 3.5–5.3)
Sodium: 138 mEq/L (ref 135–145)
Total Bilirubin: 0.4 mg/dL (ref 0.3–1.2)
Total Protein: 6.3 g/dL (ref 6.0–8.3)

## 2012-05-20 LAB — CBC WITH DIFFERENTIAL/PLATELET
BASO%: 0.2 % (ref 0.0–2.0)
Basophils Absolute: 0 10*3/uL (ref 0.0–0.1)
EOS%: 0.8 % (ref 0.0–7.0)
HGB: 11.2 g/dL — ABNORMAL LOW (ref 11.6–15.9)
MCH: 28.6 pg (ref 25.1–34.0)
MCV: 84.4 fL (ref 79.5–101.0)
MONO%: 7.9 % (ref 0.0–14.0)
RBC: 3.91 10*6/uL (ref 3.70–5.45)
RDW: 16.5 % — ABNORMAL HIGH (ref 11.2–14.5)
lymph#: 1.2 10*3/uL (ref 0.9–3.3)
nRBC: 0 % (ref 0–0)

## 2012-05-20 MED ORDER — CYANOCOBALAMIN 1000 MCG/ML IJ SOLN
1000.0000 ug | Freq: Once | INTRAMUSCULAR | Status: AC
  Administered 2012-05-20: 1000 ug via INTRAMUSCULAR

## 2012-05-20 MED ORDER — ALTEPLASE 2 MG IJ SOLR
2.0000 mg | Freq: Once | INTRAMUSCULAR | Status: AC | PRN
  Administered 2012-05-20: 2 mg
  Filled 2012-05-20: qty 2

## 2012-05-20 MED ORDER — CARBOPLATIN CHEMO INJECTION 450 MG/45ML
330.0000 mg | Freq: Once | INTRAVENOUS | Status: AC
  Administered 2012-05-20: 330 mg via INTRAVENOUS
  Filled 2012-05-20: qty 33

## 2012-05-20 MED ORDER — ONDANSETRON 16 MG/50ML IVPB (CHCC)
16.0000 mg | Freq: Once | INTRAVENOUS | Status: AC
  Administered 2012-05-20: 16 mg via INTRAVENOUS

## 2012-05-20 MED ORDER — SODIUM CHLORIDE 0.9 % IV SOLN
Freq: Once | INTRAVENOUS | Status: AC
  Administered 2012-05-20: 12:00:00 via INTRAVENOUS

## 2012-05-20 MED ORDER — SODIUM CHLORIDE 0.9 % IV SOLN
375.0000 mg/m2 | Freq: Once | INTRAVENOUS | Status: AC
  Administered 2012-05-20: 775 mg via INTRAVENOUS
  Filled 2012-05-20: qty 31

## 2012-05-20 MED ORDER — DEXAMETHASONE SODIUM PHOSPHATE 4 MG/ML IJ SOLN
20.0000 mg | Freq: Once | INTRAMUSCULAR | Status: AC
  Administered 2012-05-20: 20 mg via INTRAVENOUS

## 2012-05-20 MED ORDER — SODIUM CHLORIDE 0.9 % IJ SOLN
10.0000 mL | INTRAMUSCULAR | Status: DC | PRN
  Administered 2012-05-20: 10 mL
  Filled 2012-05-20: qty 10

## 2012-05-20 MED ORDER — HEPARIN SOD (PORK) LOCK FLUSH 100 UNIT/ML IV SOLN
500.0000 [IU] | Freq: Once | INTRAVENOUS | Status: AC | PRN
  Administered 2012-05-20: 500 [IU]
  Filled 2012-05-20: qty 5

## 2012-05-20 NOTE — Telephone Encounter (Signed)
appts made and printed for pt aom °

## 2012-05-20 NOTE — Progress Notes (Signed)
Pt's daughter [name] called wanting to know if there is anything they can do to prevent pt's port from having future issues with blood return.  She wanted to know whether she needed to start on ASA again.  Per Dr Donnald Garre, it would not make a difference either way.  [name] verbalized understanding.  SLJ

## 2012-05-20 NOTE — Progress Notes (Signed)
[facility] Telephone:([phone]   Fax:([phone]  OFFICE PROGRESS NOTE  [name], MD [address] [facility] [address] [place], [address]st [address] [place] [address]  PRINCIPAL DIAGNOSIS: Metastatic non-small cell lung cancer, adenocarcinoma initially diagnosed as stage IB (T2a, N0, N0) in March 2012.   PRIOR THERAPY: Status post wedge resection of the right upper lobe under the care of Dr. Laneta Simmers on February 14, 2011.   CURRENT THERAPY: Systemic chemotherapy with carboplatin for AUC of 5 and Alimta 500 mg/M2 every 3 weeks, the patient is status post 2 cycles.   INTERVAL HISTORY: Veronica Duarte 66 y.o. female returns to the clinic today for followup visit accompanied by her daughter Veronica Duarte and a caregiver. Overall she's tolerating the chemotherapy relatively well. She has been feeling weak for the past few days however it has come to light that she's not been eating very well. The patient is now living with her daughter Veronica Duarte. Her daughter has been on vacation for the past week. She did have some diarrhea over the past week but she's been eating some trail makes that seems to be repeated her history of irritable bowel. Since the daughter is been back home and the patient has been back in her care she's had no further episodes of diarrhea. She seems to have some increase shortness of breath with minimal exertion. The daughter check a pulse ox and found to be 88% with exertion but rapidly he recovered to mid 90s within 2 minutes. She has the cardiac history as well as a history of COPD. She is appointment with her cardiologist later this month. Patient has a history of some dementia and admits that she just doesn't have an appetite and forgets to eat. She presents to proceed with her third cycle of systemic chemotherapy with carboplatin and Alimta.  She has no current fever or chills, no nausea or vomiting. She has no chest pain but has shortness of breath with exertion as stated  above.  MEDICAL HISTORY: Past Medical History  Diagnosis Date  . Diabetes mellitus     type 2- metformin  . ADENOCARCINOMA, RIGHT LUNG, UPPER LOBE 02/04/2011  . Depression   . RAS (renal artery stenosis)   . AAA (abdominal aortic aneurysm)   . Osteoarthritis   . COPD (chronic obstructive pulmonary disease)   . Hx of colonic polyps   . CAD (coronary artery disease)   . OSA (obstructive sleep apnea)     Uses CPAP pressure is 6 study was done > 5 years  . Hypertension     Sees Dr. Tresa Endo with [facility]    ALLERGIES:   has no known allergies.  MEDICATIONS:  Current Outpatient Prescriptions  Medication Sig Dispense Refill  . Aclidinium Bromide (TUDORZA PRESSAIR IN) Inhale 1 puff into the lungs 2 (two) times daily.      Marland Kitchen albuterol (PROVENTIL HFA;VENTOLIN HFA) 108 (90 BASE) MCG/ACT inhaler Inhale 2 puffs into the lungs every 6 (six) hours as needed. For shortness of breath      . budesonide-formoterol (SYMBICORT) 160-4.5 MCG/ACT inhaler Inhale 2 puffs into the lungs 2 (two) times daily.      Marland Kitchen buPROPion (WELLBUTRIN XL) 150 MG 24 hr tablet Take 1 tablet (150 mg total) by mouth daily.  90 tablet  3  . cetirizine (ZYRTEC) 10 MG tablet Take 10 mg by mouth daily as needed. For allergies      . dexamethasone (DECADRON) 4 MG tablet Take 4  mg by mouth See admin instructions. 1 tab BID day before of and after chemo      . diltiazem (CARDIZEM CD) 120 MG 24 hr capsule       . diltiazem (CARDIZEM) 120 MG tablet Take 120 mg by mouth daily.       . DULoxetine (CYMBALTA) 60 MG capsule Take 60 mg by mouth daily.      . ferrous sulfate 325 (65 FE) MG tablet Take 325 mg by mouth daily with breakfast.       . folic acid (FOLVITE) 1 MG tablet Take 1 mg by mouth daily.      Marland Kitchen guaiFENesin (MUCINEX) 600 MG 12 hr tablet Take 600 mg by mouth 2 (two) times daily as needed. For congestion      . lidocaine-prilocaine (EMLA) cream Apply 1 application topically as needed. Apply to port 1 hour before chemo appointment       . losartan (COZAAR) 50 MG tablet       . magnesium oxide (MAG-OX) 400 (241.3 MG) MG tablet Take 1 tablet (400 mg total) by mouth 3 (three) times daily.  90 tablet  1  . meclizine (ANTIVERT) 25 MG tablet Take 25 mg by mouth 3 (three) times daily as needed. For dizziness.      . megestrol (MEGACE) 400 MG/10ML suspension Take 10 mLs (400 mg total) by mouth daily.  240 mL  0  . metFORMIN (GLUCOPHAGE) 500 MG tablet Take 500 mg by mouth 2 (two) times daily with a meal.      . Multiple Vitamin (MULITIVITAMIN WITH MINERALS) TABS Take 1 tablet by mouth daily.      . nebivolol (BYSTOLIC) 10 MG tablet Take 10 mg by mouth daily.      . niacin (NIASPAN) 1000 MG CR tablet Take 1,000 mg by mouth at bedtime.       . potassium chloride SA (K-DUR,KLOR-CON) 20 MEQ tablet Take 2 tablets (40 mEq total) by mouth daily.  7 tablet  0  . potassium chloride SA (K-DUR,KLOR-CON) 20 MEQ tablet Take 1 tablet (20 mEq total) by mouth as directed. Take x 7 days, then take daily.  65 tablet  1  . PRESCRIPTION MEDICATION Inject into the vein every 21 ( twenty-one) days. Alimta and Carboplatin every 3 weeks.      . prochlorperazine (COMPAZINE) 10 MG tablet Take 10 mg by mouth every 6 (six) hours as needed. For nausea      . vitamin C (ASCORBIC ACID) 500 MG tablet Take 500 mg by mouth 2 (two) times daily.         SURGICAL HISTORY:  Past Surgical History  Procedure Date  . 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 Vesicovaginal fistula closure w/ tah   . Abdominal hysterectomy   . Right video-assisted thoracoscopy with wedge resection of 02/14/11    Dr Evelene Croon  . Cardiac catheterization 08/30/2009    Dr Aleen Campi  . Intraoperative transesophageal echocardiography. 08/30/2009    Dr Kipp Brood  . Mediastinoscopy 04/03/2012    Procedure: MEDIASTINOSCOPY;  Surgeon: Alleen Borne, MD;  Location: [facility] OR;  Service: Thoracic;  Laterality: N/A;  . Portacath  placement 04/03/2012    Procedure: INSERTION PORT-A-CATH;  Surgeon: Alleen Borne, MD;  Location: [facility] OR;  Service: Thoracic;  Laterality: Left;  Insertion of 9.6Fr. Pre-attached power port in Left Subclavian    REVIEW OF SYSTEMS:  A comprehensive review  of systems was negative except for: Constitutional: positive for anorexia and Generalized weakness Respiratory: positive for dyspnea on exertion   PHYSICAL EXAMINATION: General appearance: alert, cooperative and no distress Head: Normocephalic, without obvious abnormality, atraumatic Neck: no adenopathy Lymph nodes: Cervical, supraclavicular, and axillary nodes normal. Resp: clear to auscultation bilaterally Cardio: regular rate and rhythm, S1, S2 normal, no murmur, click, rub or gallop GI: soft, non-tender; bowel sounds normal; no masses,  no organomegaly Extremities: extremities normal, atraumatic, no cyanosis or edema Neurologic: Alert and oriented X 3, normal strength and tone. Normal symmetric reflexes. Normal coordination and gait  ECOG PERFORMANCE STATUS: 1 - Symptomatic but completely ambulatory  Blood pressure 147/82, pulse 88, temperature 97.2 F (36.2 C), temperature source Oral, height 5\' 4"  (1.626 m), weight 182 lb 1.6 oz (82.6 kg), SpO2 98.00%. Pulse ox with exertion was 92% with a heart rate between 97 and 98 beats per minute  LABORATORY DATA: Lab Results  Component Value Date   WBC 14.1* 05/20/2012   HGB 11.2* 05/20/2012   HCT 33.0* 05/20/2012   MCV 84.4 05/20/2012   PLT 270 05/20/2012      Chemistry      Component Value Date/Time   NA 140 05/14/2012 1048   NA 145 09/06/2011 0902   K 4.0 05/14/2012 1048   K 3.9 09/06/2011 0902   CL 105 05/14/2012 1048   CL 100 09/06/2011 0902   CO2 21 05/14/2012 1048   CO2 26 09/06/2011 0902   BUN 11 05/14/2012 1048   BUN 12 09/06/2011 0902   CREATININE 0.94 05/14/2012 1048   CREATININE 0.5* 09/06/2011 0902      Component Value Date/Time   CALCIUM 9.5 05/14/2012 1048   CALCIUM 9.5  09/06/2011 0902   ALKPHOS 43 05/14/2012 1048   ALKPHOS 55 09/06/2011 0902   AST 24 05/14/2012 1048   AST 24 09/06/2011 0902   ALT 37* 05/14/2012 1048   BILITOT 0.5 05/14/2012 1048   BILITOT 0.70 09/06/2011 0902       RADIOGRAPHIC STUDIES: X-ray Chest Pa And Lateral   04/17/2012  *RADIOLOGY REPORT*  Clinical Data: Productive cough, COPD  CHEST - 2 VIEW  Comparison: 04/03/2012  Findings: 11 mm nodule in the right mid lung.  Additional known right pulmonary nodule is not radiographically apparent.  Chronic interstitial markings/emphysematous changes.  The heart is normal in size.  Postsurgical changes related to prior CABG.  Mild prominence of the right paratracheal region/hilum, corresponding to known adenopathy.  Left chest port.  IMPRESSION: 11 mm nodule in the right mid lung.  Additional known right upper lobe nodule is not radiographically apparent.  Prominence of the right paratracheal region/hilum, corresponding to known adenopathy.  Original Report Authenticated By: Charline Bills, M.D.   Dg Chest 2 View Within Previous 72 Hours.  Films Obtained On Friday Are Acceptable For Monday And Tuesday Cases  04/02/2012  *RADIOLOGY REPORT*  Clinical Data: Preop lung cancer  CHEST - 2 VIEW  Comparison: PET CT scan 03/06/2038  Findings: Sternotomy wires overlie stable cardiac silhouette. Nodularity in the left upper lobe is unchanged.   No evidence of pulmonary edema or pneumothorax.  Surgical staples in the right upper lobe.  IMPRESSION: . 1.  Stable postoperative findings the right upper lobe.  2.   Stable right lung nodularity. 3.  No acute findings.  Original Report Authenticated By: Genevive Bi, M.D.   Dg Chest Port 1 View  04/03/2012  *RADIOLOGY REPORT*  Clinical Data: Post mediastinoscopy and Port-A-Cath insertion  PORTABLE  CHEST - 1 VIEW  Comparison: Portable exam 1435 hours compared to 04/02/2012  Findings: New left subclavian Port-A-Cath, tip projecting over SVC. Normal-sized cardiac  silhouette post CABG. Calcified tortuous aorta. Pulmonary vascularity normal. Emphysematous changes with bibasilar atelectasis. No infiltrate, pleural effusion or pneumothorax. No acute osseous findings.  IMPRESSION: No pneumothorax following Port-A-Cath insertion. Emphysematous changes with bibasilar atelectasis. Post CABG.  Original Report Authenticated By: Lollie Marrow, M.D.   Dg Fluoro Guide Cv Line-no Report  04/03/2012  CLINICAL DATA: Port a cath   FLOURO GUIDE CV LINE  Fluoroscopy was utilized by the requesting physician.  No radiographic  interpretation.      ASSESSMENT/PLAN: This is a very pleasant 66 years old white female with metastatic non-small cell lung cancer, adenocarcinoma. The patient is currently undergoing systemic chemotherapy with carboplatin and Alimta,  Status post 2 cycles. The patient was discussed with Dr. Arbutus Ped. She'll proceed with her third cycle of systemic chemotherapy with carboplatin and Alimta as scheduled. She'll continue with weekly labs consisting of a CBC differential and C. met. She'll followup with Dr. Arbutus Ped in 3 weeks with repeat CBC differential, C. met as well as a CT of the chest abdomen and pelvis without contrast to reevaluate her disease. She is encouraged to increase her by mouth intake both of food and fluids. She is encouraged to keep her followup appointment with her cardiologist. Her daughter states that she has just started some home physical therapy. Her shortness of breath may be a result of her COPD as well as of deconditioning. We will continue to monitor this on subsequent followup visits.  Laural Benes, Vergene Marland E, PA-C   All questions were answered. The patient knows to call the clinic with any problems, questions or concerns. We can certainly see the patient much sooner if necessary.  I spent 20 minutes counseling the patient face to face. The total time spent in the appointment was 30 minutes.

## 2012-05-22 ENCOUNTER — Ambulatory Visit: Payer: Medicare Other | Admitting: Physician Assistant

## 2012-05-22 ENCOUNTER — Ambulatory Visit: Payer: Medicare Other

## 2012-05-22 ENCOUNTER — Other Ambulatory Visit: Payer: Medicare Other | Admitting: Lab

## 2012-05-26 ENCOUNTER — Other Ambulatory Visit: Payer: Self-pay | Admitting: *Deleted

## 2012-05-26 DIAGNOSIS — C3410 Malignant neoplasm of upper lobe, unspecified bronchus or lung: Secondary | ICD-10-CM

## 2012-05-26 MED ORDER — MEGESTROL ACETATE 400 MG/10ML PO SUSP: 400.0000 mg | Freq: Every day | ORAL | Status: DC

## 2012-05-28 ENCOUNTER — Other Ambulatory Visit (HOSPITAL_BASED_OUTPATIENT_CLINIC_OR_DEPARTMENT_OTHER): Payer: Medicare Other | Admitting: Lab

## 2012-05-28 DIAGNOSIS — C3410 Malignant neoplasm of upper lobe, unspecified bronchus or lung: Secondary | ICD-10-CM

## 2012-05-28 LAB — CBC WITH DIFFERENTIAL/PLATELET
Basophils Absolute: 0 10*3/uL (ref 0.0–0.1)
EOS%: 0.1 % (ref 0.0–7.0)
Eosinophils Absolute: 0 10*3/uL (ref 0.0–0.5)
HGB: 10.8 g/dL — ABNORMAL LOW (ref 11.6–15.9)
NEUT#: 3.8 10*3/uL (ref 1.5–6.5)
RDW: 16.8 % — ABNORMAL HIGH (ref 11.2–14.5)
WBC: 6.1 10*3/uL (ref 3.9–10.3)
lymph#: 1.9 10*3/uL (ref 0.9–3.3)

## 2012-05-28 LAB — COMPREHENSIVE METABOLIC PANEL
AST: 33 U/L (ref 0–37)
Albumin: 4 g/dL (ref 3.5–5.2)
BUN: 24 mg/dL — ABNORMAL HIGH (ref 6–23)
Calcium: 9.4 mg/dL (ref 8.4–10.5)
Chloride: 106 mEq/L (ref 96–112)
Glucose, Bld: 96 mg/dL (ref 70–99)
Potassium: 3.9 mEq/L (ref 3.5–5.3)

## 2012-06-03 ENCOUNTER — Ambulatory Visit (HOSPITAL_COMMUNITY)
Admission: RE | Admit: 2012-06-03 | Discharge: 2012-06-03 | Disposition: A | Discharge status: Home / Self Care | Payer: Medicare Other | Source: Ambulatory Visit | Attending: Physician Assistant | Admitting: Physician Assistant

## 2012-06-03 ENCOUNTER — Telehealth: Payer: Self-pay | Admitting: Internal Medicine

## 2012-06-03 ENCOUNTER — Ambulatory Visit (HOSPITAL_BASED_OUTPATIENT_CLINIC_OR_DEPARTMENT_OTHER): Payer: Medicare Other | Admitting: Internal Medicine

## 2012-06-03 ENCOUNTER — Encounter: Payer: Self-pay | Admitting: *Deleted

## 2012-06-03 ENCOUNTER — Ambulatory Visit: Payer: Medicare Other

## 2012-06-03 ENCOUNTER — Telehealth: Payer: Self-pay | Admitting: Medical Oncology

## 2012-06-03 ENCOUNTER — Encounter: Payer: Self-pay | Admitting: Pharmacist

## 2012-06-03 ENCOUNTER — Encounter (HOSPITAL_COMMUNITY): Payer: Self-pay

## 2012-06-03 ENCOUNTER — Other Ambulatory Visit: Payer: Self-pay | Admitting: Medical Oncology

## 2012-06-03 ENCOUNTER — Other Ambulatory Visit: Payer: Self-pay | Admitting: Physician Assistant

## 2012-06-03 ENCOUNTER — Telehealth: Payer: Self-pay | Admitting: *Deleted

## 2012-06-03 VITALS — BP 138/82 | HR 74 | Temp 97.5°F | Ht 64.0 in | Wt 183.1 lb

## 2012-06-03 DIAGNOSIS — K5730 Diverticulosis of large intestine without perforation or abscess without bleeding: Secondary | ICD-10-CM | POA: Insufficient documentation

## 2012-06-03 DIAGNOSIS — I82409 Acute embolism and thrombosis of unspecified deep veins of unspecified lower extremity: Secondary | ICD-10-CM

## 2012-06-03 DIAGNOSIS — Z951 Presence of aortocoronary bypass graft: Secondary | ICD-10-CM | POA: Insufficient documentation

## 2012-06-03 DIAGNOSIS — R05 Cough: Secondary | ICD-10-CM | POA: Insufficient documentation

## 2012-06-03 DIAGNOSIS — C3410 Malignant neoplasm of upper lobe, unspecified bronchus or lung: Secondary | ICD-10-CM

## 2012-06-03 DIAGNOSIS — I700 Atherosclerosis of aorta: Secondary | ICD-10-CM | POA: Insufficient documentation

## 2012-06-03 DIAGNOSIS — I708 Atherosclerosis of other arteries: Secondary | ICD-10-CM | POA: Insufficient documentation

## 2012-06-03 DIAGNOSIS — K8020 Calculus of gallbladder without cholecystitis without obstruction: Secondary | ICD-10-CM | POA: Insufficient documentation

## 2012-06-03 DIAGNOSIS — Z90710 Acquired absence of both cervix and uterus: Secondary | ICD-10-CM | POA: Insufficient documentation

## 2012-06-03 DIAGNOSIS — R0602 Shortness of breath: Secondary | ICD-10-CM

## 2012-06-03 DIAGNOSIS — K7689 Other specified diseases of liver: Secondary | ICD-10-CM | POA: Insufficient documentation

## 2012-06-03 DIAGNOSIS — I824Y9 Acute embolism and thrombosis of unspecified deep veins of unspecified proximal lower extremity: Secondary | ICD-10-CM

## 2012-06-03 DIAGNOSIS — R918 Other nonspecific abnormal finding of lung field: Secondary | ICD-10-CM | POA: Insufficient documentation

## 2012-06-03 DIAGNOSIS — N200 Calculus of kidney: Secondary | ICD-10-CM | POA: Insufficient documentation

## 2012-06-03 DIAGNOSIS — C3490 Malignant neoplasm of unspecified part of unspecified bronchus or lung: Secondary | ICD-10-CM | POA: Insufficient documentation

## 2012-06-03 DIAGNOSIS — I2699 Other pulmonary embolism without acute cor pulmonale: Secondary | ICD-10-CM | POA: Insufficient documentation

## 2012-06-03 DIAGNOSIS — I77819 Aortic ectasia, unspecified site: Secondary | ICD-10-CM | POA: Insufficient documentation

## 2012-06-03 DIAGNOSIS — R079 Chest pain, unspecified: Secondary | ICD-10-CM | POA: Insufficient documentation

## 2012-06-03 DIAGNOSIS — R059 Cough, unspecified: Secondary | ICD-10-CM | POA: Insufficient documentation

## 2012-06-03 MED ORDER — IOHEXOL 350 MG/ML SOLN: 100.0000 mL | Freq: Once | INTRAVENOUS | Status: AC | PRN

## 2012-06-03 MED ORDER — ENOXAPARIN SODIUM 120 MG/0.8ML ~~LOC~~ SOLN: 1.5000 mg/kg | Freq: Every day | SUBCUTANEOUS | Status: DC

## 2012-06-03 NOTE — Progress Notes (Signed)
[facility] Telephone:([phone]   Fax:([phone]  OFFICE PROGRESS NOTE  [name], MD [address] [facility] [address] [place], 1st [address] [place] [address]  PRINCIPAL DIAGNOSIS:  1) Metastatic non-small cell lung cancer, adenocarcinoma initially diagnosed as stage IB (T2a, N0, N0) in March 2012.  2) deep venous thrombosis of the left lower extremity diagnosed on 06/03/2012  PRIOR THERAPY: Status post wedge resection of the right upper lobe under the care of Dr. Laneta Simmers on February 14, 2011.   CURRENT THERAPY:  1) Systemic chemotherapy with carboplatin for AUC of 5 and Alimta 500 mg/M2 every 3 weeks, the patient is status post 3 cycles. 2) Lovenox 120 mg subcutaneous daily. First dose today.  INTERVAL HISTORY: Veronica Duarte 66 y.o. female returns to the clinic today for followup visit accompanied by her daughter. The patient is doing fine today with no specific complaints. She tolerated the last cycle of her systemic chemotherapy fairly well. Her daughter noted swelling in the left lower extremity. The patient underwent Doppler of the lower extremities at the [facility] heart and vascular office and it showed deep venous thrombosis involving the left femoral vein, popliteal vein and calf veins. She is here today for evaluation and recommendation regarding treatment of her condition. She continues to have shortness of breath with exertion, but no significant chest pain or cough, or hemoptysis.  MEDICAL HISTORY: Past Medical History  Diagnosis Date  . Diabetes mellitus     type 2- metformin  . ADENOCARCINOMA, RIGHT LUNG, UPPER LOBE 02/04/2011  . Depression   . RAS (renal artery stenosis)   . AAA (abdominal aortic aneurysm)   . Osteoarthritis   . COPD (chronic obstructive pulmonary disease)   . Hx of colonic polyps   . CAD (coronary artery disease)   . OSA (obstructive sleep apnea)     Uses CPAP pressure is 6 study was done > 5 years  . Hypertension     Sees  Dr. Tresa Endo with [facility]    ALLERGIES:   has no known allergies.  MEDICATIONS:  Current Outpatient Prescriptions  Medication Sig Dispense Refill  . Aclidinium Bromide (TUDORZA PRESSAIR IN) Inhale 1 puff into the lungs 2 (two) times daily.      Marland Kitchen albuterol (PROVENTIL HFA;VENTOLIN HFA) 108 (90 BASE) MCG/ACT inhaler Inhale 2 puffs into the lungs every 6 (six) hours as needed. For shortness of breath      . budesonide-formoterol (SYMBICORT) 160-4.5 MCG/ACT inhaler Inhale 2 puffs into the lungs 2 (two) times daily.      Marland Kitchen buPROPion (WELLBUTRIN XL) 150 MG 24 hr tablet Take 1 tablet (150 mg total) by mouth daily.  90 tablet  3  . cetirizine (ZYRTEC) 10 MG tablet Take 10 mg by mouth daily as needed. For allergies      . dexamethasone (DECADRON) 4 MG tablet Take 4 mg by mouth See admin instructions. 1 tab BID day before of and after chemo      . diltiazem (CARDIZEM CD) 120 MG 24 hr capsule       . diltiazem (CARDIZEM) 120 MG tablet Take 120 mg by mouth daily.       . DULoxetine (CYMBALTA) 60 MG capsule Take 60 mg by mouth daily.      . ferrous sulfate 325 (65 FE) MG tablet Take 325 mg by mouth daily with breakfast.       . folic acid (FOLVITE) 1 MG tablet Take 1 mg by mouth  daily.      . guaiFENesin (MUCINEX) 600 MG 12 hr tablet Take 600 mg by mouth 2 (two) times daily as needed. For congestion      . lidocaine-prilocaine (EMLA) cream Apply 1 application topically as needed. Apply to port 1 hour before chemo appointment      . losartan (COZAAR) 50 MG tablet       . magnesium oxide (MAG-OX) 400 (241.3 MG) MG tablet Take 1 tablet (400 mg total) by mouth 3 (three) times daily.  90 tablet  1  . meclizine (ANTIVERT) 25 MG tablet Take 25 mg by mouth 3 (three) times daily as needed. For dizziness.      . megestrol (MEGACE) 400 MG/10ML suspension Take 10 mLs (400 mg total) by mouth daily.  240 mL  1  . metFORMIN (GLUCOPHAGE) 500 MG tablet Take 500 mg by mouth 2 (two) times daily with a meal.      . Multiple  Vitamin (MULITIVITAMIN WITH MINERALS) TABS Take 1 tablet by mouth daily.      . nebivolol (BYSTOLIC) 10 MG tablet Take 10 mg by mouth daily.      . niacin (NIASPAN) 1000 MG CR tablet Take 1,000 mg by mouth at bedtime.       . potassium chloride SA (K-DUR,KLOR-CON) 20 MEQ tablet Take 2 tablets (40 mEq total) by mouth daily.  7 tablet  0  . potassium chloride SA (K-DUR,KLOR-CON) 20 MEQ tablet Take 1 tablet (20 mEq total) by mouth as directed. Take x 7 days, then take daily.  65 tablet  1  . PRESCRIPTION MEDICATION Inject into the vein every 21 ( twenty-one) days. Alimta and Carboplatin every 3 weeks.      . prochlorperazine (COMPAZINE) 10 MG tablet Take 10 mg by mouth every 6 (six) hours as needed. For nausea      . vitamin C (ASCORBIC ACID) 500 MG tablet Take 500 mg by mouth 2 (two) times daily.         SURGICAL HISTORY:  Past Surgical History  Procedure Date  . 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 Vesicovaginal fistula closure w/ tah   . Abdominal hysterectomy   . Right video-assisted thoracoscopy with wedge resection of 02/14/11    Dr Evelene Croon  . Cardiac catheterization 08/30/2009    Dr Aleen Campi  . Intraoperative transesophageal echocardiography. 08/30/2009    Dr Kipp Brood  . Mediastinoscopy 04/03/2012    Procedure: MEDIASTINOSCOPY;  Surgeon: Alleen Borne, MD;  Location: [facility] OR;  Service: Thoracic;  Laterality: N/A;  . Portacath placement 04/03/2012    Procedure: INSERTION PORT-A-CATH;  Surgeon: Alleen Borne, MD;  Location: [facility] OR;  Service: Thoracic;  Laterality: Left;  Insertion of 9.6Fr. Pre-attached power port in Left Subclavian    REVIEW OF SYSTEMS:  A comprehensive review of systems was negative except for: Constitutional: positive for fatigue Respiratory: positive for dyspnea on exertion   PHYSICAL EXAMINATION: General appearance: alert, cooperative and no distress Head: Normocephalic, without  obvious abnormality, atraumatic Neck: no adenopathy Lymph nodes: Cervical, supraclavicular, and axillary nodes normal. Resp: clear to auscultation bilaterally Cardio: regular rate and rhythm, S1, S2 normal, no murmur, click, rub or gallop GI: soft, non-tender; bowel sounds normal; no masses,  no organomegaly Extremities: extremities normal, atraumatic, no cyanosis or edema Neurologic: Alert and oriented X 3, normal strength and tone. Normal symmetric reflexes. Normal coordination and gait  ECOG PERFORMANCE STATUS: 2 -  Symptomatic, <50% confined to bed  There were no vitals taken for this visit.  LABORATORY DATA: Lab Results  Component Value Date   WBC 6.1 05/28/2012   HGB 10.8* 05/28/2012   HCT 32.8* 05/28/2012   MCV 86.8 05/28/2012   PLT 102* 05/28/2012      Chemistry      Component Value Date/Time   NA 141 05/28/2012 1116   NA 145 09/06/2011 0902   K 3.9 05/28/2012 1116   K 3.9 09/06/2011 0902   CL 106 05/28/2012 1116   CL 100 09/06/2011 0902   CO2 21 05/28/2012 1116   CO2 26 09/06/2011 0902   BUN 24* 05/28/2012 1116   BUN 12 09/06/2011 0902   CREATININE 1.14* 05/28/2012 1116   CREATININE 0.5* 09/06/2011 0902      Component Value Date/Time   CALCIUM 9.4 05/28/2012 1116   CALCIUM 9.5 09/06/2011 0902   ALKPHOS 45 05/28/2012 1116   ALKPHOS 55 09/06/2011 0902   AST 33 05/28/2012 1116   AST 24 09/06/2011 0902   ALT 49* 05/28/2012 1116   BILITOT 0.8 05/28/2012 1116   BILITOT 0.70 09/06/2011 0902       RADIOGRAPHIC STUDIES: No results found.  ASSESSMENT: This is a very pleasant 66 years old white female with metastatic non-small cell lung cancer status post 3 cycles of systemic chemotherapy with carboplatin and Alimta and recently diagnosed with deep venous thrombosis of the left lower extremity.  PLAN: I have a lengthy discussion with the patient and her daughter today about her current condition. I recommended for the patient to start treatment with Lovenox 1.5 mg/kg on daily  basis for the next 3-6 months. Her daughter is a Engineer, civil (consulting) and she would give her the first dose today. Prescription was sent to [facility] patient pharmacy. I ordered CT angiogram of the chest to rule out pulmonary emboli as well as repeat CT scan of the abdomen and pelvis for restaging of her disease. This would be done today. The patient would come back for followup visit next week as previously scheduled. If the patient has any insurance issues with coverage for Lovenox, she would be considered for treatment with Coumadin and she would be followed by the Coumadin clinic at the [facility]. All questions were answered. The patient knows to call the clinic with any problems, questions or concerns. We can certainly see the patient much sooner if necessary.  I spent 15 minutes counseling the patient face to face. The total time spent in the appointment was 25 minutes.

## 2012-06-03 NOTE — Progress Notes (Signed)
Pt referred to our Coumadin clinic by Dr. Arbutus Ped on 06/03/12 for management of DVT dx today.  CT Angiogram pending to r/o PE. MD starting pt on Arixtra today & will continue for at least 5 days & will start on Coumadin 5 mg/day tomorrow 06/04/12. Duration of anticoag: at least 3 mos (if CT Angio is negative for PE) but if PE present, then plan for 6 mos. Goal INR = 2-3 Risk factors for bleeding: chemotherapy for metastatic lung cancer, anemia Date of 1st Coumadin clinic visit will be [facility] 06/08/12. Marily Lente, Pharm.D.

## 2012-06-03 NOTE — Telephone Encounter (Signed)
[facility] pharmacist called needing clarification for lovenox dosing. I told Pharmacist , Arlys John to cancel rx taht Dr Donnald Garre escribed and I will  followup in am to see if a rx needs to be called in .  Left message for [name] to call back too. My understanding is that she had some Lovenox  to give her mother and she is going to investigate drug assistance for her mother.

## 2012-06-03 NOTE — Telephone Encounter (Signed)
appts made and l/m for diane to see if i need to call the pt or not  aom

## 2012-06-03 NOTE — Telephone Encounter (Signed)
Patient's daughter called stating They want Dr. Arbutus Ped to manage her mom's DVT and asked will CT scans be done today.  Verbal order received and read back from Dr. Arbutus Ped for pt. To come in for arixtra x 5 days and collaborative nurse is working on scans.  [name] notified of these orders and can be reached at [phone].

## 2012-06-03 NOTE — Telephone Encounter (Signed)
Daughter called to report pt is at [facility] cardiology presently  getting a left  leg  doppler due to pt having swelling in left leg. The doppler report is positive and she will fax result . She is asking if pt needs to be evaluated for Pulmonary embolism for -pt reportedly has increased SOB with minimal exertion as noted at last visit, I told her I will discuss with Dr Donnald Garre

## 2012-06-03 NOTE — Progress Notes (Signed)
Spoke with pt and daughter at [facility] today.  Questions and concerns addressed 

## 2012-06-03 NOTE — Telephone Encounter (Signed)
Received fax report of venous doppler  from Dr Landry Dyke office. Per Dr Donnald Garre he would like for Dr Tresa Endo to manage DVT and he will order CT -Pt is due for restaging. I called this to Dr Landry Dyke office.

## 2012-06-04 ENCOUNTER — Telehealth: Payer: Self-pay | Admitting: Internal Medicine

## 2012-06-04 ENCOUNTER — Encounter: Payer: Self-pay | Admitting: *Deleted

## 2012-06-04 ENCOUNTER — Other Ambulatory Visit: Payer: Self-pay | Admitting: *Deleted

## 2012-06-04 ENCOUNTER — Ambulatory Visit: Payer: Medicare Other

## 2012-06-04 ENCOUNTER — Other Ambulatory Visit (HOSPITAL_BASED_OUTPATIENT_CLINIC_OR_DEPARTMENT_OTHER): Payer: Medicare Other | Admitting: Lab

## 2012-06-04 DIAGNOSIS — C3410 Malignant neoplasm of upper lobe, unspecified bronchus or lung: Secondary | ICD-10-CM

## 2012-06-04 DIAGNOSIS — I2699 Other pulmonary embolism without acute cor pulmonale: Secondary | ICD-10-CM

## 2012-06-04 LAB — CBC WITH DIFFERENTIAL/PLATELET
Basophils Absolute: 0 10*3/uL (ref 0.0–0.1)
Eosinophils Absolute: 0 10*3/uL (ref 0.0–0.5)
HCT: 29.2 % — ABNORMAL LOW (ref 34.8–46.6)
HGB: 9.6 g/dL — ABNORMAL LOW (ref 11.6–15.9)
LYMPH%: 43.4 % (ref 14.0–49.7)
MCV: 88.4 fL (ref 79.5–101.0)
MONO%: 11.2 % (ref 0.0–14.0)
NEUT#: 1.4 10*3/uL — ABNORMAL LOW (ref 1.5–6.5)
NEUT%: 45 % (ref 38.4–76.8)
Platelets: 159 10*3/uL (ref 145–400)

## 2012-06-04 LAB — COMPREHENSIVE METABOLIC PANEL
Alkaline Phosphatase: 43 U/L (ref 39–117)
BUN: 12 mg/dL (ref 6–23)
Glucose, Bld: 129 mg/dL — ABNORMAL HIGH (ref 70–99)
Total Bilirubin: 0.5 mg/dL (ref 0.3–1.2)

## 2012-06-04 MED ORDER — FONDAPARINUX SODIUM 7.5 MG/0.6ML ~~LOC~~ SOLN: 7.5000 mg | SUBCUTANEOUS | Status: DC

## 2012-06-04 NOTE — Progress Notes (Signed)
Pt's daughter [name] called stating that if pt were to take lovenox it would be $300 monthly due to pt being in the donut hole.  [name] stated that if pt were to take arixtra she could get financial assistance.  Per Dr Donnald Garre, okay for pt to do arixtra 7.5mg  daily.  Rx called into [facility] outpt pharmacy. SLJ

## 2012-06-04 NOTE — Telephone Encounter (Signed)
daughter called and l/m to cx 7/22 appt as she feels she doesnt need it,i left a vm for sj to advise     aom

## 2012-06-05 ENCOUNTER — Encounter: Payer: Self-pay | Admitting: Pharmacist

## 2012-06-05 ENCOUNTER — Encounter: Payer: Self-pay | Admitting: *Deleted

## 2012-06-05 NOTE — Progress Notes (Signed)
Pt will be on chronic Arixtra.   Will archive pt from Dose [facility] & delete any PT/INR lab draws. Not a pt for Coumadin clinic.  Confirmed w/ Kathlee Nations, RN. Marily Lente, Pharm.D.

## 2012-06-05 NOTE — Progress Notes (Signed)
[name] called stating she has filled out paperwork for arixtra financial assistance.  She will fax paperwork over and we will need to fax paperwork along with paper copy of rx for arixtra to [employer] at fax # [phone].  Copy of paperwork scanned into [employer] for our records.     [name] also requsted most recent labwork be faxed over to her.  Faxed to [phone].  SLJ

## 2012-06-08 ENCOUNTER — Other Ambulatory Visit (HOSPITAL_COMMUNITY): Payer: Medicare Other

## 2012-06-08 ENCOUNTER — Other Ambulatory Visit: Payer: Medicare Other | Admitting: Lab

## 2012-06-08 ENCOUNTER — Ambulatory Visit: Payer: Medicare Other

## 2012-06-11 ENCOUNTER — Encounter: Payer: Self-pay | Admitting: *Deleted

## 2012-06-11 ENCOUNTER — Ambulatory Visit (HOSPITAL_BASED_OUTPATIENT_CLINIC_OR_DEPARTMENT_OTHER): Payer: Medicare Other | Admitting: Internal Medicine

## 2012-06-11 ENCOUNTER — Other Ambulatory Visit (HOSPITAL_BASED_OUTPATIENT_CLINIC_OR_DEPARTMENT_OTHER): Payer: Medicare Other | Admitting: Lab

## 2012-06-11 ENCOUNTER — Ambulatory Visit (HOSPITAL_BASED_OUTPATIENT_CLINIC_OR_DEPARTMENT_OTHER): Payer: Medicare Other

## 2012-06-11 VITALS — BP 128/76 | HR 91 | Temp 98.3°F | Ht 64.0 in | Wt 181.7 lb

## 2012-06-11 DIAGNOSIS — C3410 Malignant neoplasm of upper lobe, unspecified bronchus or lung: Secondary | ICD-10-CM

## 2012-06-11 DIAGNOSIS — Z5111 Encounter for antineoplastic chemotherapy: Secondary | ICD-10-CM

## 2012-06-11 DIAGNOSIS — I2699 Other pulmonary embolism without acute cor pulmonale: Secondary | ICD-10-CM | POA: Insufficient documentation

## 2012-06-11 LAB — CBC WITH DIFFERENTIAL/PLATELET
Basophils Absolute: 0 10*3/uL (ref 0.0–0.1)
Eosinophils Absolute: 0 10*3/uL (ref 0.0–0.5)
HGB: 10.5 g/dL — ABNORMAL LOW (ref 11.6–15.9)
LYMPH%: 14.8 % (ref 14.0–49.7)
MCV: 88.4 fL (ref 79.5–101.0)
MONO#: 0.5 10*3/uL (ref 0.1–0.9)
MONO%: 5.2 % (ref 0.0–14.0)
NEUT#: 7.5 10*3/uL — ABNORMAL HIGH (ref 1.5–6.5)
Platelets: 440 10*3/uL — ABNORMAL HIGH (ref 145–400)
RBC: 3.63 10*6/uL — ABNORMAL LOW (ref 3.70–5.45)
RDW: 20 % — ABNORMAL HIGH (ref 11.2–14.5)
WBC: 9.4 10*3/uL (ref 3.9–10.3)
nRBC: 0 % (ref 0–0)

## 2012-06-11 LAB — COMPREHENSIVE METABOLIC PANEL
ALT: 23 U/L (ref 0–35)
CO2: 17 mEq/L — ABNORMAL LOW (ref 19–32)
Calcium: 9.9 mg/dL (ref 8.4–10.5)
Chloride: 107 mEq/L (ref 96–112)
Glucose, Bld: 202 mg/dL — ABNORMAL HIGH (ref 70–99)
Sodium: 138 mEq/L (ref 135–145)
Total Bilirubin: 0.4 mg/dL (ref 0.3–1.2)
Total Protein: 6.4 g/dL (ref 6.0–8.3)

## 2012-06-11 LAB — TECHNOLOGIST REVIEW

## 2012-06-11 MED ORDER — ONDANSETRON 16 MG/50ML IVPB (CHCC)
16.0000 mg | Freq: Once | INTRAVENOUS | Status: AC
  Administered 2012-06-11: 16 mg via INTRAVENOUS

## 2012-06-11 MED ORDER — DEXAMETHASONE SODIUM PHOSPHATE 4 MG/ML IJ SOLN
20.0000 mg | Freq: Once | INTRAMUSCULAR | Status: AC
  Administered 2012-06-11: 20 mg via INTRAVENOUS

## 2012-06-11 MED ORDER — SODIUM CHLORIDE 0.9 % IV SOLN
350.0000 mg | Freq: Once | INTRAVENOUS | Status: AC
  Administered 2012-06-11: 350 mg via INTRAVENOUS
  Filled 2012-06-11: qty 35

## 2012-06-11 MED ORDER — SODIUM CHLORIDE 0.9 % IJ SOLN
10.0000 mL | INTRAMUSCULAR | Status: DC | PRN
  Administered 2012-06-11: 10 mL
  Filled 2012-06-11: qty 10

## 2012-06-11 MED ORDER — SODIUM CHLORIDE 0.9 % IV SOLN
375.0000 mg/m2 | Freq: Once | INTRAVENOUS | Status: AC
  Administered 2012-06-11: 775 mg via INTRAVENOUS
  Filled 2012-06-11: qty 31

## 2012-06-11 MED ORDER — HEPARIN SOD (PORK) LOCK FLUSH 100 UNIT/ML IV SOLN
500.0000 [IU] | Freq: Once | INTRAVENOUS | Status: AC | PRN
  Administered 2012-06-11: 500 [IU]
  Filled 2012-06-11: qty 5

## 2012-06-11 MED ORDER — SODIUM CHLORIDE 0.9 % IV SOLN
Freq: Once | INTRAVENOUS | Status: AC
  Administered 2012-06-11: 10:00:00 via INTRAVENOUS

## 2012-06-11 NOTE — Patient Instructions (Signed)
[facility] Discharge Instructions for Patients Receiving Chemotherapy  Today you received the following chemotherapy agents Carboplatin/ Alimta  To help prevent nausea and vomiting after your treatment, we encourage you to take your nausea medication.   If you develop nausea and vomiting that is not controlled by your nausea medication, call the clinic. If it is after clinic hours your family physician or the after hours number for the clinic or go to the Emergency Department.   BELOW ARE SYMPTOMS THAT SHOULD BE REPORTED IMMEDIATELY:  *FEVER GREATER THAN 100.5 F  *CHILLS WITH OR WITHOUT FEVER  NAUSEA AND VOMITING THAT IS NOT CONTROLLED WITH YOUR NAUSEA MEDICATION  *UNUSUAL SHORTNESS OF BREATH  *UNUSUAL BRUISING OR BLEEDING  TENDERNESS IN MOUTH AND THROAT WITH OR WITHOUT PRESENCE OF ULCERS  *URINARY PROBLEMS  *BOWEL PROBLEMS  UNUSUAL RASH Items with * indicate a potential emergency and should be followed up as soon as possible.  One of the nurses will contact you 24 hours after your treatment. Please let the nurse know about any problems that you may have experienced. Feel free to call the clinic you have any questions or concerns. The clinic phone number is [phone].   I have been informed and understand all the instructions given to me. I know to contact the clinic, my physician, or go to the Emergency Department if any problems should occur. I do not have any questions at this time, but understand that I may call the clinic during office hours   should I have any questions or need assistance in obtaining follow up care.    __________________________________________  _____________  __________ Signature of Patient or Authorized Representative            Date                   Time    __________________________________________ Nurse's Signature

## 2012-06-11 NOTE — Progress Notes (Signed)
Spoke with pt at [facility] today.  Questions and concerns addressed 

## 2012-06-11 NOTE — Progress Notes (Signed)
[facility] Telephone:([phone]) [phone]   [phone]  OFFICE PROGRESS NOTE  [name], MD [address] [facility] [address] [place], [address] [place] [address]  PRINCIPAL DIAGNOSIS:  1) Metastatic non-small cell lung cancer, adenocarcinoma initially diagnosed as stage IB (T2a, N0, N0) in March 2012.  2) bilateral pulmonary emboli and deep venous thrombosis of the left lower extremity diagnosed on 06/03/2012   PRIOR THERAPY: Status post wedge resection of the right upper lobe under the care of Dr. Laneta Simmers on February 14, 2011.    CURRENT THERAPY:  1) Systemic chemotherapy with carboplatin for AUC of 5 and Alimta 500 mg/M2 every 3 weeks, the patient is status post 3 cycles.  2) Lovenox 80 mg subcutaneous twice a day. First dose today.  INTERVAL HISTORY: Veronica Duarte 66 y.o. female returns to the clinic today for followup visit accompanied by her daughter and sister. The patient is feeling fine today with no specific complaints. She is to have swelling of the left lower extremity but gets better than before. She also has improvement in her breathing after starting treatment with Lovenox for the bilateral pulmonary emboli. She tolerated the last cycle of her systemic chemotherapy with carboplatin and Alimta fairly well. She denied having any significant nausea or vomiting, fever or chills. She has no weight loss or night sweats.   MEDICAL HISTORY: Past Medical History  Diagnosis Date  . Diabetes mellitus     type 2- metformin  . ADENOCARCINOMA, RIGHT LUNG, UPPER LOBE 02/04/2011  . Depression   . RAS (renal artery stenosis)   . AAA (abdominal aortic aneurysm)   . Osteoarthritis   . COPD (chronic obstructive pulmonary disease)   . Hx of colonic polyps   . CAD (coronary artery disease)   . OSA (obstructive sleep apnea)     Uses CPAP pressure is 6 study was done > 5 years  . Hypertension     Sees [facility]. Tresa Endo with [facility]    ALLERGIES:   has no known  allergies.  MEDICATIONS:  Current Outpatient Prescriptions  Medication Sig Dispense Refill  . Aclidinium Bromide (TUDORZA PRESSAIR IN) Inhale 1 puff into the lungs 2 (two) times daily.      Marland Kitchen albuterol (PROVENTIL HFA;VENTOLIN HFA) 108 (90 BASE) MCG/ACT inhaler Inhale 2 puffs into the lungs every 6 (six) hours as needed. For shortness of breath      . budesonide-formoterol (SYMBICORT) 160-4.5 MCG/ACT inhaler Inhale 2 puffs into the lungs 2 (two) times daily.      Marland Kitchen buPROPion (WELLBUTRIN XL) 150 MG 24 hr tablet Take 1 tablet (150 mg total) by mouth daily.  90 tablet  3  . cetirizine (ZYRTEC) 10 MG tablet Take 10 mg by mouth daily as needed. For allergies      . dexamethasone (DECADRON) 4 MG tablet Take 4 mg by mouth See admin instructions. 1 tab BID day before of and after chemo      . diltiazem (CARDIZEM) 120 MG tablet Take 120 mg by mouth daily.       . DULoxetine (CYMBALTA) 60 MG capsule Take 60 mg by mouth daily.      . ferrous sulfate 325 (65 FE) MG tablet Take 325 mg by mouth daily with breakfast.       . folic acid (FOLVITE) 1 MG tablet Take 1 mg by mouth daily.      . fondaparinux (ARIXTRA) 7.5 MG/0.6ML SOLN Inject 0.6 mLs (7.5 mg total) into  the skin daily.  30 Syringe  3  . guaiFENesin (MUCINEX) 600 MG 12 hr tablet Take 600 mg by mouth 2 (two) times daily as needed. For congestion      . lidocaine-prilocaine (EMLA) cream Apply 1 application topically as needed. Apply to port 1 hour before chemo appointment      . losartan (COZAAR) 50 MG tablet       . magnesium oxide (MAG-OX) 400 (241.3 MG) MG tablet Take 1 tablet (400 mg total) by mouth 3 (three) times daily.  90 tablet  1  . meclizine (ANTIVERT) 25 MG tablet Take 25 mg by mouth 3 (three) times daily as needed. For dizziness.      . megestrol (MEGACE) 400 MG/10ML suspension Take 10 mLs (400 mg total) by mouth daily.  240 mL  1  . metFORMIN (GLUCOPHAGE) 500 MG tablet Take 500 mg by mouth 2 (two) times daily with a meal.      . Multiple  Vitamin (MULITIVITAMIN WITH MINERALS) TABS Take 1 tablet by mouth daily.      . nebivolol (BYSTOLIC) 10 MG tablet Take 10 mg by mouth daily.      . niacin (NIASPAN) 1000 MG CR tablet Take 1,000 mg by mouth at bedtime.       . potassium chloride SA (K-DUR,KLOR-CON) 20 MEQ tablet Take 2 tablets (40 mEq total) by mouth daily.  7 tablet  0  . PRESCRIPTION MEDICATION Inject into the vein every 21 ( twenty-one) days. Alimta and Carboplatin every 3 weeks.      . prochlorperazine (COMPAZINE) 10 MG tablet Take 10 mg by mouth every 6 (six) hours as needed. For nausea      . vitamin C (ASCORBIC ACID) 500 MG tablet Take 500 mg by mouth 2 (two) times daily.         SURGICAL HISTORY:  Past Surgical History  Procedure Date  . 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 Vesicovaginal fistula closure w/ tah   . Abdominal hysterectomy   . Right video-assisted thoracoscopy with wedge resection of 02/14/11    Dr Evelene Croon  . Cardiac catheterization 08/30/2009    Dr Aleen Campi  . Intraoperative transesophageal echocardiography. 08/30/2009    Dr Kipp Brood  . Mediastinoscopy 04/03/2012    Procedure: MEDIASTINOSCOPY;  Surgeon: Alleen Borne, MD;  Location: [facility] OR;  Service: Thoracic;  Laterality: N/A;  . Portacath placement 04/03/2012    Procedure: INSERTION PORT-A-CATH;  Surgeon: Alleen Borne, MD;  Location: [facility] OR;  Service: Thoracic;  Laterality: Left;  Insertion of 9.6Fr. Pre-attached power port in Left Subclavian    REVIEW OF SYSTEMS:  A comprehensive review of systems was negative except for: Constitutional: positive for fatigue Respiratory: positive for cough and dyspnea on exertion   PHYSICAL EXAMINATION: General appearance: alert, cooperative, fatigued and no distress Head: Normocephalic, without obvious abnormality, atraumatic Neck: no adenopathy Lymph nodes: Cervical, supraclavicular, and axillary nodes normal. Resp: clear to auscultation  bilaterally Cardio: regular rate and rhythm, S1, S2 normal, no murmur, click, rub or gallop GI: soft, non-tender; bowel sounds normal; no masses,  no organomegaly Extremities: extremities normal, atraumatic, no cyanosis or edema Neurologic: Alert and oriented X 3, normal strength and tone. Normal symmetric reflexes. Normal coordination and gait  ECOG PERFORMANCE STATUS: 1 - Symptomatic but completely ambulatory  There were no vitals taken for this visit.  LABORATORY DATA: Lab Results  Component Value Date   WBC 3.0*  06/04/2012   HGB 9.6* 06/04/2012   HCT 29.2* 06/04/2012   MCV 88.4 06/04/2012   PLT 159 06/04/2012      Chemistry      Component Value Date/Time   NA 141 06/04/2012 1118   NA 145 09/06/2011 0902   K 3.6 06/04/2012 1118   K 3.9 09/06/2011 0902   CL 107 06/04/2012 1118   CL 100 09/06/2011 0902   CO2 24 06/04/2012 1118   CO2 26 09/06/2011 0902   BUN 12 06/04/2012 1118   BUN 12 09/06/2011 0902   CREATININE 0.92 06/04/2012 1118   CREATININE 0.5* 09/06/2011 0902      Component Value Date/Time   CALCIUM 9.6 06/04/2012 1118   CALCIUM 9.5 09/06/2011 0902   ALKPHOS 43 06/04/2012 1118   ALKPHOS 55 09/06/2011 0902   AST 25 06/04/2012 1118   AST 24 09/06/2011 0902   ALT 29 06/04/2012 1118   BILITOT 0.5 06/04/2012 1118   BILITOT 0.70 09/06/2011 0902       RADIOGRAPHIC STUDIES: Ct Angio Chest W/cm &/or Wo Cm  06/03/2012  *RADIOLOGY REPORT*  Clinical Data:  Restaging lung cancer.  Chest pain, cough, shortness of breath.  CT ANGIOGRAPHY CHEST CT ABDOMEN AND PELVIS WITH CONTRAST  Technique:  Multidetector CT imaging of the chest was performed using the standard protocol during bolus administration of intravenous contrast.  Multiplanar CT image reconstructions including MIPs were obtained to evaluate the vascular anatomy. Multidetector CT imaging of the abdomen and pelvis was performed using the standard protocol during bolus administration of intravenous contrast.  Contrast:  100 ml  Omnipaque 300 IV.  Comparison:  Chest CT 03/16/2012.  PET CT 03/06/2012.   CTA CHEST  Findings:  There are moderate sized extensive bilateral pulmonary emboli involving both upper and lower lobes, new since prior study. Heart is normal size.  Aorta is normal caliber.  Prior CABG changes.  COPD changes in the lungs.  Right upper lobe nodule on image 34 measures 9 x 6 mm compared to 10 x 6 mm previously.  Inferiorly in the right upper lobe, nodule on image 41 measures 12 x 10 mm compared to 10 x 9 mm previously.  It is unclear if this slight difference is related to true growth or differences in scan planes. Nodular density in the right middle lobe anteriorly is stable, likely scarring.  Linear scarring in the lung bases.  No pleural effusions.  Postsurgical changes in the right upper lobe. Small mediastinal lymph nodes are again noted.  Precarinal lymph node has a short axis diameter of 8 mm compared 7 mm previously.  There is a 9 mm pretracheal lymph node now seen on image 24.  This appears larger than prior study when this measured 4 mm in short axis diameter. No axillary adenopathy.  No supraclavicular or hilar adenopathy. Left Port-A-Cath is in place.  Chest wall soft tissues otherwise unremarkable.  No acute or focal bony abnormality.  Degenerative changes throughout the thoracic spine.   Review of the MIP images confirms the above findings.  IMPRESSION: Moderately extensive bilateral pulmonary emboli involving both upper and lower lobes.  Postsurgical changes in the right upper lobe.  2 right upper lobe nodules are again noted.  The inferior nodule measures slightly larger, but this may be related to scan planes/technique. Recommend continued follow up or further evaluation with PET CT.  Precarinal lymph node is stable.  More superiorly, there appears to be mildly enlarging pretracheal lymph node with a short axis diameter  currently at 9 mm compared 4 mm previously.  Recommend attention on follow-up imaging or  further evaluation with PET CT.  COPD.   CT ABDOMEN AND PELVIS  Findings: Small low-density lesion in the right hepatic lobe is stable measuring 12 mm, likely cyst.  Subtle low density area posteriorly in the right hepatic lobe is noted.  In retrospect, this was likely present on prior study but very difficult to visualize.  This measures approximately 12 mm.  Cannot exclude metastasis.  Other small scattered tiny low density areas are noted, likely small cysts.  Spleen, pancreas, adrenals are unremarkable.  3.0 cm left mid pole renal cyst.  Right nephrolithiasis.  13 mm right lower pole stone. There is a 8 mm stone layering dependently in the right renal pelvis.  No ureteral stones or hydronephrosis.  Urinary bladder is unremarkable.  Aorta and iliac vessels are calcified.  Mild ectasia of the infrarenal aorta, measuring 2.6 cm.  Scattered sigmoid diverticula. No active diverticulitis.  Small bowel is decompressed.  No free fluid, free air or adenopathy.  Prior hysterectomy.  No adnexal masses.  No acute bony abnormality.  Degenerative changes in the lumbar spine.  The  Review of the MIP images confirms the above findings.  IMPRESSION: Scattered small cysts within the liver.  There is a subtle hypodense lesion posteriorly in the right hepatic lobe, which in retrospect was likely present previously but very difficult to see. This area was noted to be hypermetabolic on prior PET CT and is concerning for metastasis.  If further evaluation is felt warranted, MRI may be beneficial.  Cholelithiasis and right nephrolithiasis.  Sigmoid diverticulosis.  These results were discussed with Dr. Jerolyn Center at the time of interpretation.  Original Report Authenticated By: Cyndie Chime, M.D.    ASSESSMENT: This is a very pleasant 66 years old white female with metastatic non-small cell lung cancer, adenocarcinoma status post 3 cycles of systemic chemotherapy with carboplatin and Alimta with stable disease in general. The  patient was also found to have extensive bilateral pulmonary emboli and was started on treatment with Lovenox 80 mg subcutaneously twice a day.  PLAN: I discussed the scan results with the patient and her family. I recommended for her the following: #1 we'll continue with systemic chemotherapy with carboplatin and Alimta as scheduled and the patient receive cycle #4 today. #2 the patient cannot afford continue on Lovenox 80 mg twice a day we will try to switch her to Arixtra 7.5 milligrams subcutaneously on daily basis. She was approved for assistance program from the company. #3 she would come back for followup visit in 3 weeks with the start of cycle #5. She was advised to call me immediately if she has any concerning symptoms in the interval.  All questions were answered. The patient knows to call the clinic with any problems, questions or concerns. We can certainly see the patient much sooner if necessary.  I spent 15 minutes counseling the patient face to face. The total time spent in the appointment was 25 minutes.

## 2012-06-17 ENCOUNTER — Telehealth: Payer: Self-pay | Admitting: Internal Medicine

## 2012-06-17 ENCOUNTER — Telehealth: Payer: Self-pay | Admitting: Medical Oncology

## 2012-06-17 ENCOUNTER — Other Ambulatory Visit (HOSPITAL_BASED_OUTPATIENT_CLINIC_OR_DEPARTMENT_OTHER): Payer: Medicare Other | Admitting: Lab

## 2012-06-17 ENCOUNTER — Other Ambulatory Visit: Payer: Self-pay | Admitting: Medical Oncology

## 2012-06-17 DIAGNOSIS — R300 Dysuria: Secondary | ICD-10-CM

## 2012-06-17 DIAGNOSIS — C3410 Malignant neoplasm of upper lobe, unspecified bronchus or lung: Secondary | ICD-10-CM

## 2012-06-17 DIAGNOSIS — N390 Urinary tract infection, site not specified: Secondary | ICD-10-CM

## 2012-06-17 LAB — CBC WITH DIFFERENTIAL/PLATELET
Basophils Absolute: 0 10*3/uL (ref 0.0–0.1)
Eosinophils Absolute: 0 10*3/uL (ref 0.0–0.5)
HCT: 28.2 % — ABNORMAL LOW (ref 34.8–46.6)
HGB: 9.4 g/dL — ABNORMAL LOW (ref 11.6–15.9)
MONO#: 0.1 10*3/uL (ref 0.1–0.9)
NEUT#: 2.8 10*3/uL (ref 1.5–6.5)
RDW: 20.2 % — ABNORMAL HIGH (ref 11.2–14.5)
lymph#: 1.5 10*3/uL (ref 0.9–3.3)

## 2012-06-17 LAB — COMPREHENSIVE METABOLIC PANEL
AST: 41 U/L — ABNORMAL HIGH (ref 0–37)
Albumin: 3.7 g/dL (ref 3.5–5.2)
BUN: 24 mg/dL — ABNORMAL HIGH (ref 6–23)
CO2: 20 mEq/L (ref 19–32)
Calcium: 10.1 mg/dL (ref 8.4–10.5)
Chloride: 100 mEq/L (ref 96–112)
Glucose, Bld: 110 mg/dL — ABNORMAL HIGH (ref 70–99)
Potassium: 3.5 mEq/L (ref 3.5–5.3)

## 2012-06-17 LAB — URINALYSIS, MICROSCOPIC - CHCC
Bilirubin (Urine): NEGATIVE
Ketones: NEGATIVE mg/dL
pH: 6 (ref 4.6–8.0)

## 2012-06-17 MED ORDER — CIPROFLOXACIN HCL 250 MG PO TABS: 250.0000 mg | ORAL_TABLET | Freq: Two times a day (BID) | ORAL | Status: DC

## 2012-06-17 MED ORDER — CIPROFLOXACIN HCL 500 MG PO TABS: 500.0000 mg | ORAL_TABLET | Freq: Two times a day (BID) | ORAL | Status: AC

## 2012-06-17 NOTE — Telephone Encounter (Signed)
Called daughter and - Dr Yetta Barre already ordered -Dr Donnald Garre wants pt to take cipro 500 mg bid x 5 days-called tp daughter and [facility]

## 2012-06-17 NOTE — Telephone Encounter (Signed)
Bring in urine for testing (I ordered) THEN start cipro (Rx was sent to her pharmacy)

## 2012-06-17 NOTE — Telephone Encounter (Signed)
Daughter calling back to inform that pts Oncologist has handled this issue, UTI s/s.

## 2012-06-17 NOTE — Telephone Encounter (Signed)
Caller: Kathryn/Child; PCP: [name]; CB#: [phone]; ; ; Call regarding Urinary Freq;  Daughter calling regarding urinary pain, foul odor to urine, incontinence, frequency, onset of s/s 06/12/12, worse 06/16/12. Confusion more than normal. Hx  of bladder infections. Afebrile. Pt just had chemo a week ago, left message at Oncologist office regarding same issue and has not heard back, daughter wants to know if she needs to wait until Oncologist office calls back and let them treat or can her PCP treat, prefers to bring UA sample in since pt is immunocompromised. Please call daughter. Emergent s/s for Urinary Symptoms r/o per protocol except for see in 24 hrs due to has more than one urinary tract symptoms and no eval. Call daughter at [phone].

## 2012-06-17 NOTE — Telephone Encounter (Signed)
Pt having UTI symptoms-lab ordered and lab appointment made for today

## 2012-06-18 ENCOUNTER — Other Ambulatory Visit: Payer: Medicare Other | Admitting: Lab

## 2012-06-20 LAB — URINE CULTURE

## 2012-06-25 ENCOUNTER — Other Ambulatory Visit (HOSPITAL_BASED_OUTPATIENT_CLINIC_OR_DEPARTMENT_OTHER): Payer: Medicare Other | Admitting: Lab

## 2012-06-25 DIAGNOSIS — C3410 Malignant neoplasm of upper lobe, unspecified bronchus or lung: Secondary | ICD-10-CM

## 2012-06-25 LAB — CBC WITH DIFFERENTIAL/PLATELET
BASO%: 0.1 % (ref 0.0–2.0)
EOS%: 0.3 % (ref 0.0–7.0)
HGB: 8.9 g/dL — ABNORMAL LOW (ref 11.6–15.9)
MCH: 31.2 pg (ref 25.1–34.0)
MCHC: 33.6 g/dL (ref 31.5–36.0)
RBC: 2.85 10*6/uL — ABNORMAL LOW (ref 3.70–5.45)
RDW: 22.9 % — ABNORMAL HIGH (ref 11.2–14.5)
lymph#: 1.5 10*3/uL (ref 0.9–3.3)

## 2012-06-25 LAB — COMPREHENSIVE METABOLIC PANEL
ALT: 26 U/L (ref 0–35)
AST: 20 U/L (ref 0–37)
Albumin: 3.9 g/dL (ref 3.5–5.2)
Calcium: 9.8 mg/dL (ref 8.4–10.5)
Chloride: 107 mEq/L (ref 96–112)
Potassium: 4.1 mEq/L (ref 3.5–5.3)

## 2012-07-02 ENCOUNTER — Encounter: Payer: Self-pay | Admitting: Physician Assistant

## 2012-07-02 ENCOUNTER — Ambulatory Visit (HOSPITAL_BASED_OUTPATIENT_CLINIC_OR_DEPARTMENT_OTHER): Payer: Medicare Other

## 2012-07-02 ENCOUNTER — Other Ambulatory Visit (HOSPITAL_BASED_OUTPATIENT_CLINIC_OR_DEPARTMENT_OTHER): Payer: Medicare Other | Admitting: Lab

## 2012-07-02 ENCOUNTER — Telehealth: Payer: Self-pay | Admitting: Internal Medicine

## 2012-07-02 ENCOUNTER — Ambulatory Visit (HOSPITAL_BASED_OUTPATIENT_CLINIC_OR_DEPARTMENT_OTHER): Payer: Medicare Other | Admitting: Physician Assistant

## 2012-07-02 VITALS — BP 127/66 | HR 90 | Temp 97.0°F | Resp 22 | Ht 64.0 in | Wt 181.0 lb

## 2012-07-02 DIAGNOSIS — C3410 Malignant neoplasm of upper lobe, unspecified bronchus or lung: Secondary | ICD-10-CM

## 2012-07-02 DIAGNOSIS — Z7901 Long term (current) use of anticoagulants: Secondary | ICD-10-CM

## 2012-07-02 DIAGNOSIS — Z5111 Encounter for antineoplastic chemotherapy: Secondary | ICD-10-CM

## 2012-07-02 DIAGNOSIS — I2699 Other pulmonary embolism without acute cor pulmonale: Secondary | ICD-10-CM

## 2012-07-02 LAB — CBC WITH DIFFERENTIAL/PLATELET
HCT: 29.4 % — ABNORMAL LOW (ref 34.8–46.6)
LYMPH%: 15.5 % (ref 14.0–49.7)
MCH: 31.1 pg (ref 25.1–34.0)
MCHC: 32.7 g/dL (ref 31.5–36.0)
MCV: 95.2 fL (ref 79.5–101.0)
RDW: 22.5 % — ABNORMAL HIGH (ref 11.2–14.5)
lymph#: 0.7 10*3/uL — ABNORMAL LOW (ref 0.9–3.3)

## 2012-07-02 LAB — COMPREHENSIVE METABOLIC PANEL
Albumin: 3.5 g/dL (ref 3.5–5.2)
BUN: 14 mg/dL (ref 6–23)
CO2: 18 mEq/L — ABNORMAL LOW (ref 19–32)
Glucose, Bld: 187 mg/dL — ABNORMAL HIGH (ref 70–99)
Potassium: 4.1 mEq/L (ref 3.5–5.3)
Sodium: 139 mEq/L (ref 135–145)
Total Bilirubin: 0.4 mg/dL (ref 0.3–1.2)
Total Protein: 6.3 g/dL (ref 6.0–8.3)

## 2012-07-02 LAB — MAGNESIUM: Magnesium: 1.6 mg/dL (ref 1.5–2.5)

## 2012-07-02 MED ORDER — ONDANSETRON 16 MG/50ML IVPB (CHCC)
16.0000 mg | Freq: Once | INTRAVENOUS | Status: AC
  Administered 2012-07-02: 16 mg via INTRAVENOUS

## 2012-07-02 MED ORDER — SODIUM CHLORIDE 0.9 % IV SOLN
350.0000 mg | Freq: Once | INTRAVENOUS | Status: AC
  Administered 2012-07-02: 350 mg via INTRAVENOUS
  Filled 2012-07-02: qty 35

## 2012-07-02 MED ORDER — SODIUM CHLORIDE 0.9 % IV SOLN
375.0000 mg/m2 | Freq: Once | INTRAVENOUS | Status: AC
  Administered 2012-07-02: 775 mg via INTRAVENOUS
  Filled 2012-07-02: qty 31

## 2012-07-02 MED ORDER — DEXAMETHASONE SODIUM PHOSPHATE 4 MG/ML IJ SOLN
20.0000 mg | Freq: Once | INTRAMUSCULAR | Status: AC
  Administered 2012-07-02: 20 mg via INTRAVENOUS

## 2012-07-02 MED ORDER — SODIUM CHLORIDE 0.9 % IV SOLN
Freq: Once | INTRAVENOUS | Status: AC
  Administered 2012-07-02: 10:00:00 via INTRAVENOUS

## 2012-07-02 MED ORDER — SODIUM CHLORIDE 0.9 % IJ SOLN
10.0000 mL | INTRAMUSCULAR | Status: DC | PRN
Start: 2012-07-02 — End: 2012-07-02
  Administered 2012-07-02: 10 mL
  Filled 2012-07-02: qty 10

## 2012-07-02 MED ORDER — HEPARIN SOD (PORK) LOCK FLUSH 100 UNIT/ML IV SOLN
500.0000 [IU] | Freq: Once | INTRAVENOUS | Status: AC | PRN
  Administered 2012-07-02: 500 [IU]
  Filled 2012-07-02: qty 5

## 2012-07-02 NOTE — Progress Notes (Signed)
[facility] [phone]   [phone]  OFFICE PROGRESS NOTE  [name], MD [address] [facility] [address], [address] [place] [address]  PRINCIPAL DIAGNOSIS:  1) Metastatic non-small cell lung cancer, adenocarcinoma initially diagnosed as stage IB (T2a, N0, N0) in March 2012.  2) bilateral pulmonary emboli and deep venous thrombosis of the left lower extremity diagnosed on 06/03/2012   PRIOR THERAPY: 1.  Status post wedge resection of the right upper lobe under the care of Dr. Laneta Simmers on February 14, 2011.  2.  Lovenox 80 mg subcutaneous twice a day. First dose today.   CURRENT THERAPY:  1) Systemic chemotherapy with carboplatin for AUC of 5 and Alimta 500 mg/M2 every 3 weeks, the patient is status post 4 cycles. Current dose of Alimta is 375 mg per meter squared 2) Arixtra 7.5 mg subcutaneously daily  INTERVAL HISTORY: KATHLYNE LOUD 66 y.o. female returns to the clinic today for followup visit accompanied by her daughters, Olegario Messier and Arline Asp. The patient is feeling fine today with no specific complaints. She is been switched from Lovenox to Arixtra for the treatment of her bilateral pulmonary emboli. Overall she's tolerating her systemic chemotherapy with carboplatin and Alimta relatively well. Her daughter Lynden Ang is concerned about her memory as she is notice a significant decline over the past 2 weeks. It tends to primarily affect his short term memory. She's also had some hallucinations lately. She is scheduled to see the neurologist who diagnosed her with dementia in October 2013. She may need a refill for her potassium and magnesium supplements if these levels are still low. She is eating better. She denied having any significant nausea or vomiting, fever or chills. She has no weight loss or night sweats.   MEDICAL HISTORY: Past Medical History  Diagnosis Date  . Diabetes mellitus     type 2- metformin  . ADENOCARCINOMA, RIGHT LUNG, UPPER  LOBE 02/04/2011  . Depression   . RAS (renal artery stenosis)   . AAA (abdominal aortic aneurysm)   . Osteoarthritis   . COPD (chronic obstructive pulmonary disease)   . Hx of colonic polyps   . CAD (coronary artery disease)   . OSA (obstructive sleep apnea)     Uses CPAP pressure is 6 study was done > 5 years  . Hypertension     Sees Dr. Tresa Endo with [facility]    ALLERGIES:   has no known allergies.  MEDICATIONS:  Current Outpatient Prescriptions  Medication Sig Dispense Refill  . Aclidinium Bromide (TUDORZA PRESSAIR IN) Inhale 1 puff into the lungs 2 (two) times daily.      Marland Kitchen albuterol (PROVENTIL HFA;VENTOLIN HFA) 108 (90 BASE) MCG/ACT inhaler Inhale 2 puffs into the lungs every 6 (six) hours as needed. For shortness of breath      . budesonide-formoterol (SYMBICORT) 160-4.5 MCG/ACT inhaler Inhale 2 puffs into the lungs 2 (two) times daily.      Marland Kitchen buPROPion (WELLBUTRIN XL) 150 MG 24 hr tablet Take 1 tablet (150 mg total) by mouth daily.  90 tablet  3  . cetirizine (ZYRTEC) 10 MG tablet Take 10 mg by mouth daily as needed. For allergies      . dexamethasone (DECADRON) 4 MG tablet Take 4 mg by mouth See admin instructions. 1 tab BID day before of and after chemo      . diltiazem (CARDIZEM) 120 MG tablet Take 120 mg by mouth daily.       Marland Kitchen  DULoxetine (CYMBALTA) 60 MG capsule Take 60 mg by mouth daily.      . ferrous sulfate 325 (65 FE) MG tablet Take 325 mg by mouth daily with breakfast.       . folic acid (FOLVITE) 1 MG tablet Take 1 mg by mouth daily.      . fondaparinux (ARIXTRA) 7.5 MG/0.6ML SOLN Inject 7.5 mg into the skin daily.      Marland Kitchen guaiFENesin (MUCINEX) 600 MG 12 hr tablet Take 600 mg by mouth 2 (two) times daily as needed. For congestion      . lidocaine-prilocaine (EMLA) cream Apply 1 application topically as needed. Apply to port 1 hour before chemo appointment      . losartan (COZAAR) 50 MG tablet       . magnesium oxide (MAG-OX) 400 (241.3 MG) MG tablet Take 1 tablet (400 mg  total) by mouth 3 (three) times daily.  90 tablet  1  . meclizine (ANTIVERT) 25 MG tablet Take 25 mg by mouth 3 (three) times daily as needed. For dizziness.      . metFORMIN (GLUCOPHAGE) 500 MG tablet Take 500 mg by mouth 2 (two) times daily with a meal.      . Multiple Vitamin (MULITIVITAMIN WITH MINERALS) TABS Take 1 tablet by mouth daily.      . nebivolol (BYSTOLIC) 10 MG tablet Take 10 mg by mouth daily.      . niacin (NIASPAN) 1000 MG CR tablet Take 1,000 mg by mouth at bedtime.       . potassium chloride SA (K-DUR,KLOR-CON) 20 MEQ tablet Take 2 tablets (40 mEq total) by mouth daily.  7 tablet  0  . PRESCRIPTION MEDICATION Inject into the vein every 21 ( twenty-one) days. Alimta and Carboplatin every 3 weeks.      . prochlorperazine (COMPAZINE) 10 MG tablet Take 10 mg by mouth every 6 (six) hours as needed. For nausea      . vitamin C (ASCORBIC ACID) 500 MG tablet Take 500 mg by mouth 2 (two) times daily.       . megestrol (MEGACE) 400 MG/10ML suspension Take 10 mLs (400 mg total) by mouth daily.  240 mL  1    SURGICAL HISTORY:  Past Surgical History  Procedure Date  . 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 Vesicovaginal fistula closure w/ tah   . Abdominal hysterectomy   . Right video-assisted thoracoscopy with wedge resection of 02/14/11    Dr Evelene Croon  . Cardiac catheterization 08/30/2009    Dr Aleen Campi  . Intraoperative transesophageal echocardiography. 08/30/2009    Dr Kipp Brood  . Mediastinoscopy 04/03/2012    Procedure: MEDIASTINOSCOPY;  Surgeon: Alleen Borne, MD;  Location: [facility] OR;  Service: Thoracic;  Laterality: N/A;  . Portacath placement 04/03/2012    Procedure: INSERTION PORT-A-CATH;  Surgeon: Alleen Borne, MD;  Location: [facility] OR;  Service: Thoracic;  Laterality: Left;  Insertion of 9.6Fr. Pre-attached power port in Left Subclavian    REVIEW OF SYSTEMS:  A comprehensive review of systems was negative  except for: Constitutional: positive for fatigue Respiratory: positive for cough and dyspnea on exertion   PHYSICAL EXAMINATION: General appearance: alert, cooperative, fatigued and no distress Head: Normocephalic, without obvious abnormality, atraumatic Neck: no adenopathy Lymph nodes: Cervical, supraclavicular, and axillary nodes normal. Resp: clear to auscultation bilaterally Cardio: regular rate and rhythm, S1, S2 normal, no murmur, click, rub or gallop GI: soft, non-tender;  bowel sounds normal; no masses,  no organomegaly Extremities: extremities normal, atraumatic, no cyanosis or edema Neurologic: Alert and oriented X 3, normal strength and tone. Normal symmetric reflexes. Normal coordination and gait  ECOG PERFORMANCE STATUS: 1 - Symptomatic but completely ambulatory  Blood pressure 127/66, pulse 90, temperature 97 F (36.1 C), temperature source Oral, resp. rate 22, height 5\' 4"  (1.626 m), weight 181 lb (82.101 kg).  LABORATORY DATA: Lab Results  Component Value Date   WBC 4.4 07/02/2012   HGB 9.6* 07/02/2012   HCT 29.4* 07/02/2012   MCV 95.2 07/02/2012   PLT 373 07/02/2012      Chemistry      Component Value Date/Time   NA 140 06/25/2012 1404   NA 145 09/06/2011 0902   K 4.1 06/25/2012 1404   K 3.9 09/06/2011 0902   CL 107 06/25/2012 1404   CL 100 09/06/2011 0902   CO2 21 06/25/2012 1404   CO2 26 09/06/2011 0902   BUN 14 06/25/2012 1404   BUN 12 09/06/2011 0902   CREATININE 1.03 06/25/2012 1404   CREATININE 0.5* 09/06/2011 0902      Component Value Date/Time   CALCIUM 9.8 06/25/2012 1404   CALCIUM 9.5 09/06/2011 0902   ALKPHOS 42 06/25/2012 1404   ALKPHOS 55 09/06/2011 0902   AST 20 06/25/2012 1404   AST 24 09/06/2011 0902   ALT 26 06/25/2012 1404   BILITOT 0.4 06/25/2012 1404   BILITOT 0.70 09/06/2011 0902       RADIOGRAPHIC STUDIES: Ct Angio Chest W/cm &/or Wo Cm  06/03/2012  *RADIOLOGY REPORT*  Clinical Data:  Restaging lung cancer.  Chest pain, cough, shortness of  breath.  CT ANGIOGRAPHY CHEST CT ABDOMEN AND PELVIS WITH CONTRAST  Technique:  Multidetector CT imaging of the chest was performed using the standard protocol during bolus administration of intravenous contrast.  Multiplanar CT image reconstructions including MIPs were obtained to evaluate the vascular anatomy. Multidetector CT imaging of the abdomen and pelvis was performed using the standard protocol during bolus administration of intravenous contrast.  Contrast:  100 ml Omnipaque 300 IV.  Comparison:  Chest CT 03/16/2012.  PET CT 03/06/2012.   CTA CHEST  Findings:  There are moderate sized extensive bilateral pulmonary emboli involving both upper and lower lobes, new since prior study. Heart is normal size.  Aorta is normal caliber.  Prior CABG changes.  COPD changes in the lungs.  Right upper lobe nodule on image 34 measures 9 x 6 mm compared to 10 x 6 mm previously.  Inferiorly in the right upper lobe, nodule on image 41 measures 12 x 10 mm compared to 10 x 9 mm previously.  It is unclear if this slight difference is related to true growth or differences in scan planes. Nodular density in the right middle lobe anteriorly is stable, likely scarring.  Linear scarring in the lung bases.  No pleural effusions.  Postsurgical changes in the right upper lobe. Small mediastinal lymph nodes are again noted.  Precarinal lymph node has a short axis diameter of 8 mm compared 7 mm previously.  There is a 9 mm pretracheal lymph node now seen on image 24.  This appears larger than prior study when this measured 4 mm in short axis diameter. No axillary adenopathy.  No supraclavicular or hilar adenopathy. Left Port-A-Cath is in place.  Chest wall soft tissues otherwise unremarkable.  No acute or focal bony abnormality.  Degenerative changes throughout the thoracic spine.   Review of the MIP  images confirms the above findings.  IMPRESSION: Moderately extensive bilateral pulmonary emboli involving both upper and lower lobes.   Postsurgical changes in the right upper lobe.  2 right upper lobe nodules are again noted.  The inferior nodule measures slightly larger, but this may be related to scan planes/technique. Recommend continued follow up or further evaluation with PET CT.  Precarinal lymph node is stable.  More superiorly, there appears to be mildly enlarging pretracheal lymph node with a short axis diameter currently at 9 mm compared 4 mm previously.  Recommend attention on follow-up imaging or further evaluation with PET CT.  COPD.   CT ABDOMEN AND PELVIS  Findings: Small low-density lesion in the right hepatic lobe is stable measuring 12 mm, likely cyst.  Subtle low density area posteriorly in the right hepatic lobe is noted.  In retrospect, this was likely present on prior study but very difficult to visualize.  This measures approximately 12 mm.  Cannot exclude metastasis.  Other small scattered tiny low density areas are noted, likely small cysts.  Spleen, pancreas, adrenals are unremarkable.  3.0 cm left mid pole renal cyst.  Right nephrolithiasis.  13 mm right lower pole stone. There is a 8 mm stone layering dependently in the right renal pelvis.  No ureteral stones or hydronephrosis.  Urinary bladder is unremarkable.  Aorta and iliac vessels are calcified.  Mild ectasia of the infrarenal aorta, measuring 2.6 cm.  Scattered sigmoid diverticula. No active diverticulitis.  Small bowel is decompressed.  No free fluid, free air or adenopathy.  Prior hysterectomy.  No adnexal masses.  No acute bony abnormality.  Degenerative changes in the lumbar spine.  The  Review of the MIP images confirms the above findings.  IMPRESSION: Scattered small cysts within the liver.  There is a subtle hypodense lesion posteriorly in the right hepatic lobe, which in retrospect was likely present previously but very difficult to see. This area was noted to be hypermetabolic on prior PET CT and is concerning for metastasis.  If further evaluation is  felt warranted, MRI may be beneficial.  Cholelithiasis and right nephrolithiasis.  Sigmoid diverticulosis.  These results were discussed with Dr. Jerolyn Center at the time of interpretation.  Original Report Authenticated By: Cyndie Chime, M.D.    ASSESSMENT/PLAN: This is a very pleasant 66 years old white female with metastatic non-small cell lung cancer, adenocarcinoma status post 4 cycles of systemic chemotherapy with carboplatin and Alimta with stable disease in general. The patient was also found to have extensive bilateral pulmonary emboli and was initially started on treatment with Lovenox 80 mg subcutaneously twice a day. She is now taking Arixtra 7.5 mg subcutaneously daily for treatment of her bilateral pulmonary emboli. Patient was discussed with Dr. Arbutus Ped. We will have her daughter monitor the frequency of the hallucinations and memory issues. We'll also recommended that they see the neurologist sooner than October 2013 for reevaluation. She'll proceed with cycle #5 of her systemic chemotherapy with carboplatin and Alimta. She will continue with weekly labs consisting of a CBC differential and C. Met. She'll return in 3 weeks prior to cycle #6 also with a repeat CBC differential and C. met.  Laural Benes, Delno Blaisdell E, PA-C   All questions were answered. The patient knows to call the clinic with any problems, questions or concerns. We can certainly see the patient much sooner if necessary.  I spent 20 minutes counseling the patient face to face. The total time spent in the appointment was 30 minutes.

## 2012-07-02 NOTE — Telephone Encounter (Signed)
gve the pt's dtr the sept 2013 appt calendar

## 2012-07-02 NOTE — Patient Instructions (Addendum)
[facility] Discharge Instructions for Patients Receiving Chemotherapy  Today you received the following chemotherapy agents Alimta and Carboplatin  To help prevent nausea and vomiting after your treatment, we encourage you to take your nausea medication  Begin taking it at 7 pm and take it as often as prescribed for the next 24 to 72 hours.   If you develop nausea and vomiting that is not controlled by your nausea medication, call the clinic. If it is after clinic hours your family physician or the after hours number for the clinic or go to the Emergency Department.   BELOW ARE SYMPTOMS THAT SHOULD BE REPORTED IMMEDIATELY:  *FEVER GREATER THAN 100.5 F  *CHILLS WITH OR WITHOUT FEVER  NAUSEA AND VOMITING THAT IS NOT CONTROLLED WITH YOUR NAUSEA MEDICATION  *UNUSUAL SHORTNESS OF BREATH  *UNUSUAL BRUISING OR BLEEDING  TENDERNESS IN MOUTH AND THROAT WITH OR WITHOUT PRESENCE OF ULCERS  *URINARY PROBLEMS  *BOWEL PROBLEMS  UNUSUAL RASH Items with * indicate a potential emergency and should be followed up as soon as possible.  One of the nurses will contact you 24 hours after your treatment. Please let the nurse know about any problems that you may have experienced. Feel free to call the clinic you have any questions or concerns. The clinic phone number is [phone].   I have been informed and understand all the instructions given to me. I know to contact the clinic, my physician, or go to the Emergency Department if any problems should occur. I do not have any questions at this time, but understand that I may call the clinic during office hours   should I have any questions or need assistance in obtaining follow up care.    __________________________________________  _____________  __________ Signature of Patient or Authorized Representative            Date                   Time    __________________________________________ Nurse's Signature

## 2012-07-03 ENCOUNTER — Other Ambulatory Visit: Payer: Self-pay | Admitting: *Deleted

## 2012-07-03 DIAGNOSIS — C3410 Malignant neoplasm of upper lobe, unspecified bronchus or lung: Secondary | ICD-10-CM

## 2012-07-03 NOTE — Progress Notes (Signed)
Received refill request for magnesium 400 TID and potassium BID.  Spoke to pt's daughter Clista.  She stated at f/u with Tiana Loft, Adrena said to hold magnesium and potassium for now and will continue to monitor her labwork.  Magnesium checks weekly added to standing orders.  Called [facility] and informed pharmacy that rx is on hold.  SLJ

## 2012-07-08 ENCOUNTER — Telehealth: Payer: Self-pay | Admitting: Medical Oncology

## 2012-07-08 DIAGNOSIS — R350 Frequency of micturition: Secondary | ICD-10-CM

## 2012-07-08 NOTE — Telephone Encounter (Signed)
States mother has UTI symptoms of incontinence and frequency and requests UA tomorrow-Order entered

## 2012-07-09 ENCOUNTER — Other Ambulatory Visit: Payer: Self-pay | Admitting: *Deleted

## 2012-07-09 ENCOUNTER — Other Ambulatory Visit (HOSPITAL_BASED_OUTPATIENT_CLINIC_OR_DEPARTMENT_OTHER): Payer: Medicare Other | Admitting: Lab

## 2012-07-09 DIAGNOSIS — R300 Dysuria: Secondary | ICD-10-CM

## 2012-07-09 DIAGNOSIS — C3490 Malignant neoplasm of unspecified part of unspecified bronchus or lung: Secondary | ICD-10-CM

## 2012-07-09 DIAGNOSIS — C3410 Malignant neoplasm of upper lobe, unspecified bronchus or lung: Secondary | ICD-10-CM

## 2012-07-09 DIAGNOSIS — R350 Frequency of micturition: Secondary | ICD-10-CM

## 2012-07-09 LAB — CBC WITH DIFFERENTIAL/PLATELET
BASO%: 0.2 % (ref 0.0–2.0)
Eosinophils Absolute: 0 10*3/uL (ref 0.0–0.5)
LYMPH%: 37 % (ref 14.0–49.7)
MCHC: 33.9 g/dL (ref 31.5–36.0)
MCV: 93.2 fL (ref 79.5–101.0)
MONO%: 4.7 % (ref 0.0–14.0)
NEUT%: 58 % (ref 38.4–76.8)
Platelets: 201 10*3/uL (ref 145–400)
RBC: 2.91 10*6/uL — ABNORMAL LOW (ref 3.70–5.45)

## 2012-07-09 LAB — URINALYSIS, MICROSCOPIC - CHCC
Bilirubin (Urine): NEGATIVE
Glucose: NEGATIVE g/dL
Ketones: NEGATIVE mg/dL
Nitrite: NEGATIVE
Specific Gravity, Urine: 1.02 (ref 1.003–1.035)

## 2012-07-09 LAB — COMPREHENSIVE METABOLIC PANEL
Alkaline Phosphatase: 44 U/L (ref 39–117)
Creatinine, Ser: 0.99 mg/dL (ref 0.50–1.10)
Glucose, Bld: 89 mg/dL (ref 70–99)
Sodium: 138 mEq/L (ref 135–145)
Total Bilirubin: 0.7 mg/dL (ref 0.3–1.2)
Total Protein: 5.7 g/dL — ABNORMAL LOW (ref 6.0–8.3)

## 2012-07-09 LAB — MAGNESIUM: Magnesium: 1.1 mg/dL — ABNORMAL LOW (ref 1.5–2.5)

## 2012-07-10 ENCOUNTER — Other Ambulatory Visit: Payer: Self-pay | Admitting: *Deleted

## 2012-07-10 DIAGNOSIS — N390 Urinary tract infection, site not specified: Secondary | ICD-10-CM

## 2012-07-10 DIAGNOSIS — E876 Hypokalemia: Secondary | ICD-10-CM

## 2012-07-10 DIAGNOSIS — C3490 Malignant neoplasm of unspecified part of unspecified bronchus or lung: Secondary | ICD-10-CM

## 2012-07-10 MED ORDER — MAGNESIUM OXIDE 400 (241.3 MG) MG PO TABS: 400.0000 mg | ORAL_TABLET | Freq: Three times a day (TID) | ORAL | Status: DC

## 2012-07-10 MED ORDER — CIPROFLOXACIN HCL 500 MG PO TABS: 500.0000 mg | ORAL_TABLET | Freq: Two times a day (BID) | ORAL | Status: AC

## 2012-07-10 MED ORDER — POTASSIUM CHLORIDE CRYS ER 20 MEQ PO TBCR: 40.0000 meq | EXTENDED_RELEASE_TABLET | Freq: Every day | ORAL | Status: DC

## 2012-07-10 NOTE — Telephone Encounter (Signed)
K 3.3, Mag 1.1, spoke to Veronica Medico NP in Veronica Duarte and Veronica Duarte's absence.  Okay to restart K BID and magnesium 400 TID.  Urinalysis results also shown to Veronica Medico, NP.  Urine culture is not back yet, pt is experiencing lower back pain, urinary frequency, and urinary incontinence.  Per Veronica Duarte, okay to give Cipro 500mg  BID x 7 days.  Pt's daughter Veronica Duarte verbalized understanding.  SLJ

## 2012-07-11 NOTE — Progress Notes (Signed)
Quick Note:  Call patient with the result and order Cipro 500 mg po bid X 7 days ______

## 2012-07-16 ENCOUNTER — Telehealth: Payer: Self-pay | Admitting: Internal Medicine

## 2012-07-16 ENCOUNTER — Other Ambulatory Visit (HOSPITAL_BASED_OUTPATIENT_CLINIC_OR_DEPARTMENT_OTHER): Payer: Medicare Other | Admitting: Lab

## 2012-07-16 DIAGNOSIS — C3490 Malignant neoplasm of unspecified part of unspecified bronchus or lung: Secondary | ICD-10-CM

## 2012-07-16 DIAGNOSIS — C3410 Malignant neoplasm of upper lobe, unspecified bronchus or lung: Secondary | ICD-10-CM

## 2012-07-16 LAB — COMPREHENSIVE METABOLIC PANEL (CC13)
AST: 20 U/L (ref 5–34)
Alkaline Phosphatase: 56 U/L (ref 40–150)
BUN: 12 mg/dL (ref 7.0–26.0)
Creatinine: 1 mg/dL (ref 0.6–1.1)
Potassium: 4.4 mEq/L (ref 3.5–5.1)

## 2012-07-16 LAB — CBC WITH DIFFERENTIAL/PLATELET
EOS%: 0 % (ref 0.0–7.0)
Eosinophils Absolute: 0 10*3/uL (ref 0.0–0.5)
MCV: 95.2 fL (ref 79.5–101.0)
MONO%: 12.5 % (ref 0.0–14.0)
NEUT#: 1.4 10*3/uL — ABNORMAL LOW (ref 1.5–6.5)
RBC: 2.91 10*6/uL — ABNORMAL LOW (ref 3.70–5.45)
RDW: 17.5 % — ABNORMAL HIGH (ref 11.2–14.5)

## 2012-07-16 MED ORDER — ACLIDINIUM BROMIDE 400 MCG/ACT IN AEPB: 1.0000 | INHALATION_SPRAY | Freq: Two times a day (BID) | RESPIRATORY_TRACT | Status: DC

## 2012-07-16 NOTE — Telephone Encounter (Signed)
Spoke with the pt daughter and she states the pt loves tudorza and is doing well on it. They are asking for rx. Refill sent. Pt is aware.Carron Curie, CMA

## 2012-07-21 ENCOUNTER — Other Ambulatory Visit: Payer: Self-pay | Admitting: Internal Medicine

## 2012-07-23 ENCOUNTER — Telehealth: Payer: Self-pay | Admitting: Internal Medicine

## 2012-07-23 ENCOUNTER — Ambulatory Visit (HOSPITAL_BASED_OUTPATIENT_CLINIC_OR_DEPARTMENT_OTHER): Payer: Medicare Other | Admitting: Physician Assistant

## 2012-07-23 ENCOUNTER — Other Ambulatory Visit (HOSPITAL_BASED_OUTPATIENT_CLINIC_OR_DEPARTMENT_OTHER): Payer: Medicare Other | Admitting: Lab

## 2012-07-23 ENCOUNTER — Ambulatory Visit (HOSPITAL_BASED_OUTPATIENT_CLINIC_OR_DEPARTMENT_OTHER): Payer: Medicare Other

## 2012-07-23 VITALS — BP 124/54 | HR 85

## 2012-07-23 VITALS — BP 121/69 | HR 90 | Temp 98.2°F | Resp 22 | Ht 64.0 in | Wt 177.6 lb

## 2012-07-23 DIAGNOSIS — R350 Frequency of micturition: Secondary | ICD-10-CM

## 2012-07-23 DIAGNOSIS — Z5111 Encounter for antineoplastic chemotherapy: Secondary | ICD-10-CM

## 2012-07-23 DIAGNOSIS — C3490 Malignant neoplasm of unspecified part of unspecified bronchus or lung: Secondary | ICD-10-CM

## 2012-07-23 DIAGNOSIS — I2699 Other pulmonary embolism without acute cor pulmonale: Secondary | ICD-10-CM

## 2012-07-23 DIAGNOSIS — C3410 Malignant neoplasm of upper lobe, unspecified bronchus or lung: Secondary | ICD-10-CM

## 2012-07-23 LAB — COMPREHENSIVE METABOLIC PANEL (CC13)
CO2: 15 mEq/L — ABNORMAL LOW (ref 22–29)
Creatinine: 0.9 mg/dL (ref 0.6–1.1)
Glucose: 119 mg/dl — ABNORMAL HIGH (ref 70–99)
Total Bilirubin: 0.5 mg/dL (ref 0.20–1.20)
Total Protein: 6.3 g/dL — ABNORMAL LOW (ref 6.4–8.3)

## 2012-07-23 LAB — MAGNESIUM (CC13): Magnesium: 2 mg/dl (ref 1.5–2.5)

## 2012-07-23 LAB — CBC WITH DIFFERENTIAL/PLATELET
BASO%: 0 % (ref 0.0–2.0)
Basophils Absolute: 0 10*3/uL (ref 0.0–0.1)
EOS%: 0 % (ref 0.0–7.0)
Eosinophils Absolute: 0 10*3/uL (ref 0.0–0.5)
HCT: 30.5 % — ABNORMAL LOW (ref 34.8–46.6)
HGB: 9.8 g/dL — ABNORMAL LOW (ref 11.6–15.9)
LYMPH%: 15.6 % (ref 14.0–49.7)
MCH: 30.8 pg (ref 25.1–34.0)
MCHC: 32.1 g/dL (ref 31.5–36.0)
MCV: 95.9 fL (ref 79.5–101.0)
MONO#: 0.3 10*3/uL (ref 0.1–0.9)
MONO%: 5.3 % (ref 0.0–14.0)
NEUT#: 3.9 10*3/uL (ref 1.5–6.5)
NEUT%: 79.1 % — ABNORMAL HIGH (ref 38.4–76.8)
Platelets: 343 10*3/uL (ref 145–400)
RBC: 3.18 10*6/uL — ABNORMAL LOW (ref 3.70–5.45)
RDW: 17.4 % — ABNORMAL HIGH (ref 11.2–14.5)
WBC: 4.9 10*3/uL (ref 3.9–10.3)
lymph#: 0.8 10*3/uL — ABNORMAL LOW (ref 0.9–3.3)
nRBC: 0 % (ref 0–0)

## 2012-07-23 MED ORDER — HEPARIN SOD (PORK) LOCK FLUSH 100 UNIT/ML IV SOLN
500.0000 [IU] | Freq: Once | INTRAVENOUS | Status: AC | PRN
  Administered 2012-07-23: 500 [IU]
  Filled 2012-07-23: qty 5

## 2012-07-23 MED ORDER — SODIUM CHLORIDE 0.9 % IV SOLN
Freq: Once | INTRAVENOUS | Status: AC
  Administered 2012-07-23: 11:00:00 via INTRAVENOUS

## 2012-07-23 MED ORDER — SODIUM CHLORIDE 0.9 % IJ SOLN
10.0000 mL | INTRAMUSCULAR | Status: DC | PRN
  Administered 2012-07-23: 10 mL
  Filled 2012-07-23: qty 10

## 2012-07-23 MED ORDER — ONDANSETRON 16 MG/50ML IVPB (CHCC)
16.0000 mg | Freq: Once | INTRAVENOUS | Status: AC
  Administered 2012-07-23: 16 mg via INTRAVENOUS

## 2012-07-23 MED ORDER — SODIUM CHLORIDE 0.9 % IV SOLN
350.0000 mg | Freq: Once | INTRAVENOUS | Status: AC
  Administered 2012-07-23: 350 mg via INTRAVENOUS
  Filled 2012-07-23: qty 35

## 2012-07-23 MED ORDER — DEXAMETHASONE SODIUM PHOSPHATE 4 MG/ML IJ SOLN
20.0000 mg | Freq: Once | INTRAMUSCULAR | Status: AC
  Administered 2012-07-23: 20 mg via INTRAVENOUS

## 2012-07-23 MED ORDER — CYANOCOBALAMIN 1000 MCG/ML IJ SOLN
1000.0000 ug | Freq: Once | INTRAMUSCULAR | Status: AC
  Administered 2012-07-23: 1000 ug via INTRAMUSCULAR

## 2012-07-23 MED ORDER — SODIUM CHLORIDE 0.9 % IV SOLN
375.0000 mg/m2 | Freq: Once | INTRAVENOUS | Status: AC
  Administered 2012-07-23: 775 mg via INTRAVENOUS
  Filled 2012-07-23: qty 31

## 2012-07-23 NOTE — Patient Instructions (Addendum)
[facility] Discharge Instructions for Patients Receiving Chemotherapy  Today you received the following chemotherapy agents Carboplatin/Alimta.  To help prevent nausea and vomiting after your treatment, we encourage you to take your nausea medication as directed by Dr Arbutus Ped If you develop nausea and vomiting that is not controlled by your nausea medication, call the clinic. If it is after clinic hours your family physician or the after hours number for the clinic or go to the Emergency Department.   BELOW ARE SYMPTOMS THAT SHOULD BE REPORTED IMMEDIATELY:  *FEVER GREATER THAN 100.5 F  *CHILLS WITH OR WITHOUT FEVER  NAUSEA AND VOMITING THAT IS NOT CONTROLLED WITH YOUR NAUSEA MEDICATION  *UNUSUAL SHORTNESS OF BREATH  *UNUSUAL BRUISING OR BLEEDING  TENDERNESS IN MOUTH AND THROAT WITH OR WITHOUT PRESENCE OF ULCERS  *URINARY PROBLEMS  *BOWEL PROBLEMS  UNUSUAL RASH Items with * indicate a potential emergency and should be followed up as soon as possible.  Feel free to call the clinic you have any questions or concerns. The clinic phone number is [phone].

## 2012-07-23 NOTE — Telephone Encounter (Signed)
appts made and printed for pt aom °

## 2012-07-27 NOTE — Progress Notes (Signed)
[facility] [phone]   [phone]  OFFICE PROGRESS NOTE  [name], MD [address] [facility] [address] [place], [address] [place] [address]  PRINCIPAL DIAGNOSIS:  1) Metastatic non-small cell lung cancer, adenocarcinoma initially diagnosed as stage IB (T2a, N0, N0) in March 2012.  2) bilateral pulmonary emboli and deep venous thrombosis of the left lower extremity diagnosed on 06/03/2012   PRIOR THERAPY: 1.  Status post wedge resection of the right upper lobe under the care of Dr. Laneta Simmers on February 14, 2011.  2.  Lovenox 80 mg subcutaneous twice a day. First dose today.   CURRENT THERAPY:  1) Systemic chemotherapy with carboplatin for AUC of 5 and Alimta 500 mg/M2 every 3 weeks, the patient is status post 5 cycles. Current dose of Alimta is 375 mg per meter squared 2) Arixtra 7.5 mg subcutaneously daily  INTERVAL HISTORY: Veronica Duarte 66 y.o. female returns to the clinic today for followup visit accompanied by her daughters, Olegario Messier and Arline Asp. The patient is feeling fine today with no specific complaints. She is been switched from Lovenox to Arixtra for the treatment of her bilateral pulmonary emboli. She still has some left lower extremity edema. Overall she's tolerating her systemic chemotherapy with carboplatin and Alimta relatively well. She is scheduled with the neurologist on 08/19/2012 regarding the changes in memory as well as her tremors. She is also scheduled to be evaluated by her cardiologist on 08/14/2012. She is eating better and resting well. She voiced no specific complaints today. She denied having any significant nausea or vomiting, fever or chills. She has no weight loss or night sweats.   MEDICAL HISTORY: Past Medical History  Diagnosis Date  . Diabetes mellitus     type 2- metformin  . ADENOCARCINOMA, RIGHT LUNG, UPPER LOBE 02/04/2011  . Depression   . RAS (renal artery stenosis)   . AAA (abdominal aortic aneurysm)   .  Osteoarthritis   . COPD (chronic obstructive pulmonary disease)   . Hx of colonic polyps   . CAD (coronary artery disease)   . OSA (obstructive sleep apnea)     Uses CPAP pressure is 6 study was done > 5 years  . Hypertension     Sees Dr. Tresa Endo with [facility]    ALLERGIES:   has no known allergies.  MEDICATIONS:  Current Outpatient Prescriptions  Medication Sig Dispense Refill  . Aclidinium Bromide (TUDORZA PRESSAIR) 400 MCG/ACT AEPB Inhale 1 puff into the lungs 2 (two) times daily.  1 each  6  . albuterol (PROVENTIL HFA;VENTOLIN HFA) 108 (90 BASE) MCG/ACT inhaler Inhale 2 puffs into the lungs every 6 (six) hours as needed. For shortness of breath      . buPROPion (WELLBUTRIN XL) 150 MG 24 hr tablet Take 1 tablet (150 mg total) by mouth daily.  90 tablet  3  . cetirizine (ZYRTEC) 10 MG tablet Take 10 mg by mouth daily as needed. For allergies      . ciprofloxacin (CIPRO) 500 MG tablet       . dexamethasone (DECADRON) 4 MG tablet Take 4 mg by mouth See admin instructions. 1 tab BID day before of and after chemo      . diltiazem (CARDIZEM CD) 120 MG 24 hr capsule       . diltiazem (CARDIZEM) 120 MG tablet Take 120 mg by mouth daily.       . DULoxetine (CYMBALTA) 60 MG capsule Take 60 mg by  mouth daily.      . ferrous sulfate 325 (65 FE) MG tablet Take 325 mg by mouth daily with breakfast.       . folic acid (FOLVITE) 1 MG tablet Take 1 mg by mouth daily.      . fondaparinux (ARIXTRA) 7.5 MG/0.6ML SOLN Inject 7.5 mg into the skin daily.      Marland Kitchen guaiFENesin (MUCINEX) 600 MG 12 hr tablet Take 600 mg by mouth 2 (two) times daily as needed. For congestion      . lidocaine-prilocaine (EMLA) cream Apply 1 application topically as needed. Apply to port 1 hour before chemo appointment      . losartan (COZAAR) 50 MG tablet       . magnesium oxide (MAG-OX) 400 (241.3 MG) MG tablet Take 1 tablet (400 mg total) by mouth 3 (three) times daily.  90 tablet  1  . meclizine (ANTIVERT) 25 MG tablet Take 25 mg by  mouth 3 (three) times daily as needed. For dizziness.      . megestrol (MEGACE) 400 MG/10ML suspension Take 10 mLs (400 mg total) by mouth daily.  240 mL  1  . metFORMIN (GLUCOPHAGE) 500 MG tablet Take 500 mg by mouth 2 (two) times daily with a meal.      . Multiple Vitamin (MULITIVITAMIN WITH MINERALS) TABS Take 1 tablet by mouth daily.      . nebivolol (BYSTOLIC) 10 MG tablet Take 10 mg by mouth daily.      . niacin (NIASPAN) 1000 MG CR tablet Take 1,000 mg by mouth at bedtime.       . potassium chloride SA (K-DUR,KLOR-CON) 20 MEQ tablet Take 2 tablets (40 mEq total) by mouth daily.  60 tablet  0  . PRESCRIPTION MEDICATION Inject into the vein every 21 ( twenty-one) days. Alimta and Carboplatin every 3 weeks.      . prochlorperazine (COMPAZINE) 10 MG tablet Take 10 mg by mouth every 6 (six) hours as needed. For nausea      . SYMBICORT 160-4.5 MCG/ACT inhaler INHALE 2 PUFFS INTO THE LUNGS 2 TIMES   DAILY  10.2 g  5  . vitamin C (ASCORBIC ACID) 500 MG tablet Take 500 mg by mouth 2 (two) times daily.         SURGICAL HISTORY:  Past Surgical History  Procedure Date  . 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 Vesicovaginal fistula closure w/ tah   . Abdominal hysterectomy   . Right video-assisted thoracoscopy with wedge resection of 02/14/11    Dr Evelene Croon  . Cardiac catheterization 08/30/2009    Dr Aleen Campi  . Intraoperative transesophageal echocardiography. 08/30/2009    Dr Kipp Brood  . Mediastinoscopy 04/03/2012    Procedure: MEDIASTINOSCOPY;  Surgeon: Alleen Borne, MD;  Location: [facility] OR;  Service: Thoracic;  Laterality: N/A;  . Portacath placement 04/03/2012    Procedure: INSERTION PORT-A-CATH;  Surgeon: Alleen Borne, MD;  Location: [facility] OR;  Service: Thoracic;  Laterality: Left;  Insertion of 9.6Fr. Pre-attached power port in Left Subclavian    REVIEW OF SYSTEMS:  Pertinent items are noted in HPI.   PHYSICAL  EXAMINATION: General appearance: alert, cooperative, fatigued and no distress Head: Normocephalic, without obvious abnormality, atraumatic Neck: no adenopathy Lymph nodes: Cervical, supraclavicular, and axillary nodes normal. Resp: clear to auscultation bilaterally Cardio: regular rate and rhythm, S1, S2 normal, no murmur, click, rub or gallop GI: soft, non-tender; bowel sounds  normal; no masses,  no organomegaly Extremities: extremities normal, atraumatic, no cyanosis or edema Neurologic: Alert and oriented X 3, normal strength and tone. Normal symmetric reflexes. Normal coordination and gait  ECOG PERFORMANCE STATUS: 1 - Symptomatic but completely ambulatory  Blood pressure 121/69, pulse 90, temperature 98.2 F (36.8 C), temperature source Oral, resp. rate 22, height 5\' 4"  (1.626 m), weight 177 lb 9.6 oz (80.559 kg).  LABORATORY DATA: Lab Results  Component Value Date   WBC 4.9 07/23/2012   HGB 9.8* 07/23/2012   HCT 30.5* 07/23/2012   MCV 95.9 07/23/2012   PLT 343 07/23/2012      Chemistry      Component Value Date/Time   NA 140 07/23/2012 1002   NA 138 07/09/2012 1408   NA 145 09/06/2011 0902   K 4.4 07/23/2012 1002   K 3.3* 07/09/2012 1408   K 3.9 09/06/2011 0902   CL 108* 07/23/2012 1002   CL 103 07/09/2012 1408   CL 100 09/06/2011 0902   CO2 15* 07/23/2012 1002   CO2 24 07/09/2012 1408   CO2 26 09/06/2011 0902   BUN 15.0 07/23/2012 1002   BUN 15 07/09/2012 1408   BUN 12 09/06/2011 0902   CREATININE 0.9 07/23/2012 1002   CREATININE 0.99 07/09/2012 1408   CREATININE 0.5* 09/06/2011 0902      Component Value Date/Time   CALCIUM 10.1 07/23/2012 1002   CALCIUM 9.6 07/09/2012 1408   CALCIUM 9.5 09/06/2011 0902   ALKPHOS 53 07/23/2012 1002   ALKPHOS 44 07/09/2012 1408   ALKPHOS 55 09/06/2011 0902   AST 17 07/23/2012 1002   AST 19 07/09/2012 1408   AST 24 09/06/2011 0902   ALT 18 07/23/2012 1002   ALT 22 07/09/2012 1408   BILITOT 0.50 07/23/2012 1002   BILITOT 0.7 07/09/2012 1408   BILITOT 0.70  09/06/2011 0902       RADIOGRAPHIC STUDIES: Ct Angio Chest W/cm &/or Wo Cm  06/03/2012  *RADIOLOGY REPORT*  Clinical Data:  Restaging lung cancer.  Chest pain, cough, shortness of breath.  CT ANGIOGRAPHY CHEST CT ABDOMEN AND PELVIS WITH CONTRAST  Technique:  Multidetector CT imaging of the chest was performed using the standard protocol during bolus administration of intravenous contrast.  Multiplanar CT image reconstructions including MIPs were obtained to evaluate the vascular anatomy. Multidetector CT imaging of the abdomen and pelvis was performed using the standard protocol during bolus administration of intravenous contrast.  Contrast:  100 ml Omnipaque 300 IV.  Comparison:  Chest CT 03/16/2012.  PET CT 03/06/2012.   CTA CHEST  Findings:  There are moderate sized extensive bilateral pulmonary emboli involving both upper and lower lobes, new since prior study. Heart is normal size.  Aorta is normal caliber.  Prior CABG changes.  COPD changes in the lungs.  Right upper lobe nodule on image 34 measures 9 x 6 mm compared to 10 x 6 mm previously.  Inferiorly in the right upper lobe, nodule on image 41 measures 12 x 10 mm compared to 10 x 9 mm previously.  It is unclear if this slight difference is related to true growth or differences in scan planes. Nodular density in the right middle lobe anteriorly is stable, likely scarring.  Linear scarring in the lung bases.  No pleural effusions.  Postsurgical changes in the right upper lobe. Small mediastinal lymph nodes are again noted.  Precarinal lymph node has a short axis diameter of 8 mm compared 7 mm previously.  There is a 9  mm pretracheal lymph node now seen on image 24.  This appears larger than prior study when this measured 4 mm in short axis diameter. No axillary adenopathy.  No supraclavicular or hilar adenopathy. Left Port-A-Cath is in place.  Chest wall soft tissues otherwise unremarkable.  No acute or focal bony abnormality.  Degenerative changes  throughout the thoracic spine.   Review of the MIP images confirms the above findings.  IMPRESSION: Moderately extensive bilateral pulmonary emboli involving both upper and lower lobes.  Postsurgical changes in the right upper lobe.  2 right upper lobe nodules are again noted.  The inferior nodule measures slightly larger, but this may be related to scan planes/technique. Recommend continued follow up or further evaluation with PET CT.  Precarinal lymph node is stable.  More superiorly, there appears to be mildly enlarging pretracheal lymph node with a short axis diameter currently at 9 mm compared 4 mm previously.  Recommend attention on follow-up imaging or further evaluation with PET CT.  COPD.   CT ABDOMEN AND PELVIS  Findings: Small low-density lesion in the right hepatic lobe is stable measuring 12 mm, likely cyst.  Subtle low density area posteriorly in the right hepatic lobe is noted.  In retrospect, this was likely present on prior study but very difficult to visualize.  This measures approximately 12 mm.  Cannot exclude metastasis.  Other small scattered tiny low density areas are noted, likely small cysts.  Spleen, pancreas, adrenals are unremarkable.  3.0 cm left mid pole renal cyst.  Right nephrolithiasis.  13 mm right lower pole stone. There is a 8 mm stone layering dependently in the right renal pelvis.  No ureteral stones or hydronephrosis.  Urinary bladder is unremarkable.  Aorta and iliac vessels are calcified.  Mild ectasia of the infrarenal aorta, measuring 2.6 cm.  Scattered sigmoid diverticula. No active diverticulitis.  Small bowel is decompressed.  No free fluid, free air or adenopathy.  Prior hysterectomy.  No adnexal masses.  No acute bony abnormality.  Degenerative changes in the lumbar spine.  The  Review of the MIP images confirms the above findings.  IMPRESSION: Scattered small cysts within the liver.  There is a subtle hypodense lesion posteriorly in the right hepatic lobe, which in  retrospect was likely present previously but very difficult to see. This area was noted to be hypermetabolic on prior PET CT and is concerning for metastasis.  If further evaluation is felt warranted, MRI may be beneficial.  Cholelithiasis and right nephrolithiasis.  Sigmoid diverticulosis.  These results were discussed with Dr. Jerolyn Center at the time of interpretation.  Original Report Authenticated By: Cyndie Chime, M.D.    ASSESSMENT/PLAN: This is a very pleasant 66 years old white female with metastatic non-small cell lung cancer, adenocarcinoma status post 5 cycles of systemic chemotherapy with carboplatin and Alimta with stable disease in general. The patient was also found to have extensive bilateral pulmonary emboli she will continue on Arixtra 7.5 mg subcutaneously for treatment of her bilateral pulmonary emboli. Patient was discussed with Dr. Arbutus Ped. She'll proceed with cycle #6 of her systemic chemotherapy with carboplatin and Alimta. She'll followup with Dr. Arbutus Ped in 3 weeks with repeat CBC differential, C. met and CT of the chest abdomen and pelvis with contrast to reevaluate her disease.   Laural Benes, Momoko Slezak E, PA-C   All questions were answered. The patient knows to call the clinic with any problems, questions or concerns. We can certainly see the patient much sooner if necessary.  I  spent 20 minutes counseling the patient face to face. The total time spent in the appointment was 30 minutes.

## 2012-07-30 ENCOUNTER — Other Ambulatory Visit (HOSPITAL_BASED_OUTPATIENT_CLINIC_OR_DEPARTMENT_OTHER): Payer: Medicare Other | Admitting: Lab

## 2012-07-30 DIAGNOSIS — C3410 Malignant neoplasm of upper lobe, unspecified bronchus or lung: Secondary | ICD-10-CM

## 2012-07-30 DIAGNOSIS — C3490 Malignant neoplasm of unspecified part of unspecified bronchus or lung: Secondary | ICD-10-CM

## 2012-07-30 LAB — COMPREHENSIVE METABOLIC PANEL (CC13)
ALT: 25 U/L (ref 0–55)
Albumin: 3.7 g/dL (ref 3.5–5.0)
Alkaline Phosphatase: 50 U/L (ref 40–150)
CO2: 21 mEq/L — ABNORMAL LOW (ref 22–29)
Potassium: 4.7 mEq/L (ref 3.5–5.1)
Sodium: 140 mEq/L (ref 136–145)
Total Bilirubin: 0.6 mg/dL (ref 0.20–1.20)
Total Protein: 6.2 g/dL — ABNORMAL LOW (ref 6.4–8.3)

## 2012-07-30 LAB — CBC WITH DIFFERENTIAL/PLATELET
Basophils Absolute: 0 10*3/uL (ref 0.0–0.1)
EOS%: 0.2 % (ref 0.0–7.0)
Eosinophils Absolute: 0 10*3/uL (ref 0.0–0.5)
HGB: 10.5 g/dL — ABNORMAL LOW (ref 11.6–15.9)
MCV: 97.3 fL (ref 79.5–101.0)
MONO%: 6.5 % (ref 0.0–14.0)
NEUT#: 2.4 10*3/uL (ref 1.5–6.5)
RBC: 3.26 10*6/uL — ABNORMAL LOW (ref 3.70–5.45)
RDW: 17 % — ABNORMAL HIGH (ref 11.2–14.5)
lymph#: 1.7 10*3/uL (ref 0.9–3.3)

## 2012-07-30 LAB — MAGNESIUM (CC13): Magnesium: 1.9 mg/dl (ref 1.5–2.5)

## 2012-08-03 ENCOUNTER — Other Ambulatory Visit: Payer: Self-pay | Admitting: Medical Oncology

## 2012-08-03 DIAGNOSIS — R300 Dysuria: Secondary | ICD-10-CM

## 2012-08-03 NOTE — Telephone Encounter (Signed)
Has UTI symptoms, incontinent . Cannot see urology before Oct 3rd. Orders placed for tomorrow

## 2012-08-04 ENCOUNTER — Other Ambulatory Visit: Payer: Self-pay | Admitting: Medical Oncology

## 2012-08-04 ENCOUNTER — Telehealth: Payer: Self-pay | Admitting: Medical Oncology

## 2012-08-04 ENCOUNTER — Other Ambulatory Visit (HOSPITAL_BASED_OUTPATIENT_CLINIC_OR_DEPARTMENT_OTHER): Payer: Medicare Other | Admitting: Lab

## 2012-08-04 DIAGNOSIS — N390 Urinary tract infection, site not specified: Secondary | ICD-10-CM

## 2012-08-04 DIAGNOSIS — R300 Dysuria: Secondary | ICD-10-CM

## 2012-08-04 DIAGNOSIS — C3410 Malignant neoplasm of upper lobe, unspecified bronchus or lung: Secondary | ICD-10-CM

## 2012-08-04 DIAGNOSIS — R350 Frequency of micturition: Secondary | ICD-10-CM

## 2012-08-04 LAB — CBC WITH DIFFERENTIAL/PLATELET
BASO%: 0.2 % (ref 0.0–2.0)
EOS%: 0 % (ref 0.0–7.0)
LYMPH%: 31.1 % (ref 14.0–49.7)
MCHC: 33.3 g/dL (ref 31.5–36.0)
MCV: 98.4 fL (ref 79.5–101.0)
MONO%: 8.4 % (ref 0.0–14.0)
Platelets: 124 10*3/uL — ABNORMAL LOW (ref 145–400)
RBC: 3.08 10*6/uL — ABNORMAL LOW (ref 3.70–5.45)
RDW: 16.4 % — ABNORMAL HIGH (ref 11.2–14.5)

## 2012-08-04 LAB — URINALYSIS, MICROSCOPIC - CHCC
Glucose: NEGATIVE g/dL
Specific Gravity, Urine: 1.015 (ref 1.003–1.035)

## 2012-08-04 LAB — MAGNESIUM (CC13): Magnesium: 2 mg/dl (ref 1.5–2.5)

## 2012-08-04 LAB — COMPREHENSIVE METABOLIC PANEL (CC13)
BUN: 13 mg/dL (ref 7.0–26.0)
CO2: 20 mEq/L — ABNORMAL LOW (ref 22–29)
Creatinine: 1 mg/dL (ref 0.6–1.1)
Glucose: 147 mg/dl — ABNORMAL HIGH (ref 70–99)
Total Bilirubin: 0.6 mg/dL (ref 0.20–1.20)

## 2012-08-04 MED ORDER — LEVOFLOXACIN 750 MG PO TABS: 750.0000 mg | ORAL_TABLET | Freq: Every day | ORAL | Status: DC

## 2012-08-04 NOTE — Telephone Encounter (Signed)
Called d[facility] daughter and [facility]

## 2012-08-04 NOTE — Telephone Encounter (Signed)
Per Adrena hold potassium now. I faxed pt labs to her daughter and notified her about potassium

## 2012-08-04 NOTE — Telephone Encounter (Signed)
Daughter asking if pt needs to pre med with decadron - i told her yes

## 2012-08-04 NOTE — Addendum Note (Signed)
Addended by: Charma Igo on: 08/04/2012 12:17 PM   Modules accepted: Orders

## 2012-08-04 NOTE — Progress Notes (Signed)
Urine culture ordered

## 2012-08-05 ENCOUNTER — Other Ambulatory Visit: Payer: Self-pay | Admitting: *Deleted

## 2012-08-05 DIAGNOSIS — E876 Hypokalemia: Secondary | ICD-10-CM

## 2012-08-05 MED ORDER — POTASSIUM CHLORIDE CRYS ER 20 MEQ PO TBCR: 40.0000 meq | EXTENDED_RELEASE_TABLET | Freq: Every day | ORAL | Status: DC

## 2012-08-06 ENCOUNTER — Other Ambulatory Visit: Payer: Medicare Other | Admitting: Lab

## 2012-08-07 ENCOUNTER — Encounter: Payer: Self-pay | Admitting: *Deleted

## 2012-08-07 LAB — URINE CULTURE

## 2012-08-07 NOTE — Progress Notes (Signed)
Urine Culture shows enterococcus species.  Pt was started on levaquin 750mg  x 5 days per Tiana Loft.  Per the urine culture, levaquin is sensitive to enterococcus species.  Test results placed on Dr [facility] desk to review.  SLJ

## 2012-08-10 ENCOUNTER — Ambulatory Visit (HOSPITAL_COMMUNITY)
Admission: RE | Admit: 2012-08-10 | Discharge: 2012-08-10 | Disposition: A | Discharge status: Home / Self Care | Payer: Medicare Other | Source: Ambulatory Visit | Attending: Physician Assistant | Admitting: Physician Assistant

## 2012-08-10 ENCOUNTER — Encounter (HOSPITAL_COMMUNITY): Payer: Self-pay

## 2012-08-10 DIAGNOSIS — Z86711 Personal history of pulmonary embolism: Secondary | ICD-10-CM | POA: Insufficient documentation

## 2012-08-10 DIAGNOSIS — N281 Cyst of kidney, acquired: Secondary | ICD-10-CM | POA: Insufficient documentation

## 2012-08-10 DIAGNOSIS — J984 Other disorders of lung: Secondary | ICD-10-CM | POA: Insufficient documentation

## 2012-08-10 DIAGNOSIS — J438 Other emphysema: Secondary | ICD-10-CM | POA: Insufficient documentation

## 2012-08-10 DIAGNOSIS — C8589 Other specified types of non-Hodgkin lymphoma, extranodal and solid organ sites: Secondary | ICD-10-CM | POA: Insufficient documentation

## 2012-08-10 DIAGNOSIS — C3410 Malignant neoplasm of upper lobe, unspecified bronchus or lung: Secondary | ICD-10-CM

## 2012-08-10 DIAGNOSIS — Z79899 Other long term (current) drug therapy: Secondary | ICD-10-CM | POA: Insufficient documentation

## 2012-08-10 DIAGNOSIS — K7689 Other specified diseases of liver: Secondary | ICD-10-CM | POA: Insufficient documentation

## 2012-08-10 DIAGNOSIS — R29898 Other symptoms and signs involving the musculoskeletal system: Secondary | ICD-10-CM | POA: Insufficient documentation

## 2012-08-10 DIAGNOSIS — K8020 Calculus of gallbladder without cholecystitis without obstruction: Secondary | ICD-10-CM | POA: Insufficient documentation

## 2012-08-10 DIAGNOSIS — N200 Calculus of kidney: Secondary | ICD-10-CM | POA: Insufficient documentation

## 2012-08-10 DIAGNOSIS — R0602 Shortness of breath: Secondary | ICD-10-CM | POA: Insufficient documentation

## 2012-08-10 DIAGNOSIS — R6883 Chills (without fever): Secondary | ICD-10-CM | POA: Insufficient documentation

## 2012-08-10 DIAGNOSIS — R918 Other nonspecific abnormal finding of lung field: Secondary | ICD-10-CM | POA: Insufficient documentation

## 2012-08-10 DIAGNOSIS — R599 Enlarged lymph nodes, unspecified: Secondary | ICD-10-CM | POA: Insufficient documentation

## 2012-08-10 MED ORDER — IOHEXOL 300 MG/ML  SOLN
100.0000 mL | Freq: Once | INTRAMUSCULAR | Status: AC | PRN
  Administered 2012-08-10: 100 mL via INTRAVENOUS

## 2012-08-11 ENCOUNTER — Other Ambulatory Visit: Payer: Self-pay | Admitting: *Deleted

## 2012-08-11 DIAGNOSIS — C3490 Malignant neoplasm of unspecified part of unspecified bronchus or lung: Secondary | ICD-10-CM

## 2012-08-11 MED ORDER — DEXAMETHASONE 4 MG PO TABS: 4.0000 mg | ORAL_TABLET | ORAL | Status: DC

## 2012-08-13 ENCOUNTER — Telehealth: Payer: Self-pay | Admitting: Internal Medicine

## 2012-08-13 ENCOUNTER — Ambulatory Visit (HOSPITAL_BASED_OUTPATIENT_CLINIC_OR_DEPARTMENT_OTHER): Payer: Medicare Other | Admitting: Internal Medicine

## 2012-08-13 ENCOUNTER — Other Ambulatory Visit: Payer: Self-pay | Admitting: Medical Oncology

## 2012-08-13 ENCOUNTER — Other Ambulatory Visit (HOSPITAL_BASED_OUTPATIENT_CLINIC_OR_DEPARTMENT_OTHER): Payer: Medicare Other | Admitting: Lab

## 2012-08-13 ENCOUNTER — Ambulatory Visit (HOSPITAL_BASED_OUTPATIENT_CLINIC_OR_DEPARTMENT_OTHER): Payer: Medicare Other

## 2012-08-13 VITALS — BP 135/72 | HR 78 | Temp 98.2°F | Resp 24 | Ht 64.0 in | Wt 175.1 lb

## 2012-08-13 DIAGNOSIS — C3490 Malignant neoplasm of unspecified part of unspecified bronchus or lung: Secondary | ICD-10-CM

## 2012-08-13 DIAGNOSIS — J40 Bronchitis, not specified as acute or chronic: Secondary | ICD-10-CM

## 2012-08-13 DIAGNOSIS — C3410 Malignant neoplasm of upper lobe, unspecified bronchus or lung: Secondary | ICD-10-CM

## 2012-08-13 DIAGNOSIS — N390 Urinary tract infection, site not specified: Secondary | ICD-10-CM

## 2012-08-13 DIAGNOSIS — Z5111 Encounter for antineoplastic chemotherapy: Secondary | ICD-10-CM

## 2012-08-13 LAB — CBC WITH DIFFERENTIAL/PLATELET
BASO%: 0.1 % (ref 0.0–2.0)
Basophils Absolute: 0 10*3/uL (ref 0.0–0.1)
Eosinophils Absolute: 0 10*3/uL (ref 0.0–0.5)
HCT: 31.7 % — ABNORMAL LOW (ref 34.8–46.6)
HGB: 10.7 g/dL — ABNORMAL LOW (ref 11.6–15.9)
LYMPH%: 11.4 % — ABNORMAL LOW (ref 14.0–49.7)
MCHC: 33.7 g/dL (ref 31.5–36.0)
MONO#: 0.4 10*3/uL (ref 0.1–0.9)
NEUT%: 82 % — ABNORMAL HIGH (ref 38.4–76.8)
Platelets: 251 10*3/uL (ref 145–400)
WBC: 5.7 10*3/uL (ref 3.9–10.3)

## 2012-08-13 LAB — COMPREHENSIVE METABOLIC PANEL (CC13)
BUN: 10 mg/dL (ref 7.0–26.0)
CO2: 20 mEq/L — ABNORMAL LOW (ref 22–29)
Calcium: 10 mg/dL (ref 8.4–10.4)
Chloride: 107 mEq/L (ref 98–107)
Creatinine: 1 mg/dL (ref 0.6–1.1)
Glucose: 173 mg/dl — ABNORMAL HIGH (ref 70–99)
Total Bilirubin: 0.4 mg/dL (ref 0.20–1.20)

## 2012-08-13 LAB — MAGNESIUM (CC13): Magnesium: 1.9 mg/dl (ref 1.5–2.5)

## 2012-08-13 MED ORDER — DEXAMETHASONE 4 MG PO TABS: 4.0000 mg | ORAL_TABLET | ORAL | Status: DC

## 2012-08-13 MED ORDER — HEPARIN SOD (PORK) LOCK FLUSH 100 UNIT/ML IV SOLN
500.0000 [IU] | Freq: Once | INTRAVENOUS | Status: AC | PRN
  Administered 2012-08-13: 500 [IU]
  Filled 2012-08-13: qty 5

## 2012-08-13 MED ORDER — SODIUM CHLORIDE 0.9 % IV SOLN
500.0000 mg/m2 | Freq: Once | INTRAVENOUS | Status: AC
  Administered 2012-08-13: 950 mg via INTRAVENOUS
  Filled 2012-08-13: qty 38

## 2012-08-13 MED ORDER — DEXAMETHASONE SODIUM PHOSPHATE 10 MG/ML IJ SOLN
10.0000 mg | Freq: Once | INTRAMUSCULAR | Status: AC
  Administered 2012-08-13: 10 mg via INTRAVENOUS

## 2012-08-13 MED ORDER — LEVOFLOXACIN 750 MG PO TABS: 750.0000 mg | ORAL_TABLET | Freq: Every day | ORAL | Status: DC

## 2012-08-13 MED ORDER — SODIUM CHLORIDE 0.9 % IV SOLN
Freq: Once | INTRAVENOUS | Status: AC
  Administered 2012-08-13: 12:00:00 via INTRAVENOUS

## 2012-08-13 MED ORDER — ONDANSETRON 8 MG/50ML IVPB (CHCC)
8.0000 mg | Freq: Once | INTRAVENOUS | Status: AC
  Administered 2012-08-13: 8 mg via INTRAVENOUS

## 2012-08-13 MED ORDER — SODIUM CHLORIDE 0.9 % IJ SOLN
10.0000 mL | INTRAMUSCULAR | Status: DC | PRN
  Administered 2012-08-13: 10 mL
  Filled 2012-08-13: qty 10

## 2012-08-13 NOTE — Telephone Encounter (Signed)
Scheduled lab and MD visit, emailed Marcelino Duster regarding chemo , pt opt to get chemo from [facility] @ chemo room

## 2012-08-13 NOTE — Progress Notes (Signed)
[facility] [phone]   [phone]  OFFICE PROGRESS NOTE  [name], MD [address] [facility] [address] [place], [address] [place] [address]  PRINCIPAL DIAGNOSIS:  1) Metastatic non-small cell lung cancer, adenocarcinoma initially diagnosed as stage IB (T2a, N0, N0) in March 2012.  2) bilateral pulmonary emboli and deep venous thrombosis of the left lower extremity diagnosed on 06/03/2012   PRIOR THERAPY:  1. Status post wedge resection of the right upper lobe under the care of Dr. Laneta Simmers on February 14, 2011.  2. Lovenox 80 mg subcutaneous twice a day. First dose today. 3. Systemic chemotherapy with carboplatin for AUC of 5 and Alimta 500 mg/M2 every 3 weeks, the patient is status post 6 cycles. Current dose of Alimta is 375 mg per meter squared   CURRENT THERAPY:  1) The patient will start today the first dose of maintenance Alimta 500 mg/M2 every 3 weeks. 2) Arixtra 7.5 mg subcutaneously daily.  INTERVAL HISTORY: Veronica Duarte 66 y.o. female returns to the clinic today for followup visit accompanied by her 2 daughters. The patient tolerated the last cycle of her systemic chemotherapy with carboplatin and Alimta fairly well. She denied having any significant nausea or vomiting, no fever or chills. She was treated recently for urinary tract infection with Levaquin. The patient denied having any significant chest pain, shortness breath, cough or hemoptysis. She has repeat CT scan of the chest, abdomen and pelvis performed recently and she is here for evaluation and discussion of her scan results.  MEDICAL HISTORY: Past Medical History  Diagnosis Date  . Diabetes mellitus     type 2- metformin  . ADENOCARCINOMA, RIGHT LUNG, UPPER LOBE 02/04/2011  . Depression   . RAS (renal artery stenosis)   . AAA (abdominal aortic aneurysm)   . Osteoarthritis   . COPD (chronic obstructive pulmonary disease)   . Hx of colonic polyps   . CAD (coronary artery  disease)   . OSA (obstructive sleep apnea)     Uses CPAP pressure is 6 study was done > 5 years  . Hypertension     Sees Dr. Tresa Endo with [facility]    ALLERGIES:   has no known allergies.  MEDICATIONS:  Current Outpatient Prescriptions  Medication Sig Dispense Refill  . Aclidinium Bromide (TUDORZA PRESSAIR) 400 MCG/ACT AEPB Inhale 1 puff into the lungs 2 (two) times daily.  1 each  6  . albuterol (PROVENTIL HFA;VENTOLIN HFA) 108 (90 BASE) MCG/ACT inhaler Inhale 2 puffs into the lungs every 6 (six) hours as needed. For shortness of breath      . buPROPion (WELLBUTRIN XL) 150 MG 24 hr tablet Take 1 tablet (150 mg total) by mouth daily.  90 tablet  3  . cetirizine (ZYRTEC) 10 MG tablet Take 10 mg by mouth daily as needed. For allergies      . ciprofloxacin (CIPRO) 500 MG tablet       . dexamethasone (DECADRON) 4 MG tablet Take 1 tablet (4 mg total) by mouth See admin instructions. 1 tab BID day before of and after chemo  40 tablet  0  . diltiazem (CARDIZEM CD) 120 MG 24 hr capsule       . diltiazem (CARDIZEM) 120 MG tablet Take 120 mg by mouth daily.       . DULoxetine (CYMBALTA) 60 MG capsule Take 60 mg by mouth daily.      . ferrous sulfate 325 (65 FE) MG tablet  Take 325 mg by mouth daily with breakfast.       . folic acid (FOLVITE) 1 MG tablet Take 1 mg by mouth daily.      . fondaparinux (ARIXTRA) 7.5 MG/0.6ML SOLN Inject 7.5 mg into the skin daily.      Marland Kitchen guaiFENesin (MUCINEX) 600 MG 12 hr tablet Take 600 mg by mouth 2 (two) times daily as needed. For congestion      . levofloxacin (LEVAQUIN) 750 MG tablet Take 1 tablet (750 mg total) by mouth daily.  5 tablet  0  . lidocaine-prilocaine (EMLA) cream Apply 1 application topically as needed. Apply to port 1 hour before chemo appointment      . losartan (COZAAR) 50 MG tablet       . magnesium oxide (MAG-OX) 400 (241.3 MG) MG tablet Take 1 tablet (400 mg total) by mouth 3 (three) times daily.  90 tablet  1  . meclizine (ANTIVERT) 25 MG tablet  Take 25 mg by mouth 3 (three) times daily as needed. For dizziness.      . megestrol (MEGACE) 400 MG/10ML suspension Take 10 mLs (400 mg total) by mouth daily.  240 mL  1  . metFORMIN (GLUCOPHAGE) 500 MG tablet Take 500 mg by mouth 2 (two) times daily with a meal.      . Multiple Vitamin (MULITIVITAMIN WITH MINERALS) TABS Take 1 tablet by mouth daily.      . nebivolol (BYSTOLIC) 10 MG tablet Take 10 mg by mouth daily.      . niacin (NIASPAN) 1000 MG CR tablet Take 1,000 mg by mouth at bedtime.       . potassium chloride SA (K-DUR,KLOR-CON) 20 MEQ tablet Take 2 tablets (40 mEq total) by mouth daily.  60 tablet  0  . PRESCRIPTION MEDICATION Inject into the vein every 21 ( twenty-one) days. Alimta and Carboplatin every 3 weeks.      . prochlorperazine (COMPAZINE) 10 MG tablet Take 10 mg by mouth every 6 (six) hours as needed. For nausea      . SYMBICORT 160-4.5 MCG/ACT inhaler INHALE 2 PUFFS INTO THE LUNGS 2 TIMES   DAILY  10.2 g  5  . vitamin C (ASCORBIC ACID) 500 MG tablet Take 500 mg by mouth 2 (two) times daily.         SURGICAL HISTORY:  Past Surgical History  Procedure Date  . 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 Vesicovaginal fistula closure w/ tah   . Abdominal hysterectomy   . Right video-assisted thoracoscopy with wedge resection of 02/14/11    Dr Evelene Croon  . Cardiac catheterization 08/30/2009    Dr Aleen Campi  . Intraoperative transesophageal echocardiography. 08/30/2009    Dr Kipp Brood  . Mediastinoscopy 04/03/2012    Procedure: MEDIASTINOSCOPY;  Surgeon: Alleen Borne, MD;  Location[facility] OR;  Service: Thoracic;  Laterality: N/A;  . Portacath placement 04/03/2012    Procedure: INSERTION PORT-A-CATH;  Surgeon: Alleen Borne, MD;  Location: [facility] OR;  Service: Thoracic;  Laterality: Left;  Insertion of 9.6Fr. Pre-attached power port in Left Subclavian    REVIEW OF SYSTEMS:  A comprehensive review of systems was  negative except for: Constitutional: positive for fatigue   PHYSICAL EXAMINATION: General appearance: alert, cooperative and no distress Head: Normocephalic, without obvious abnormality, atraumatic Neck: no adenopathy Lymph nodes: Cervical, supraclavicular, and axillary nodes normal. Resp: clear to auscultation bilaterally Cardio: regular rate and rhythm, S1, S2  normal, no murmur, click, rub or gallop GI: soft, non-tender; bowel sounds normal; no masses,  no organomegaly Extremities: extremities normal, atraumatic, no cyanosis or edema Neurologic: Alert and oriented X 3, normal strength and tone. Normal symmetric reflexes. Normal coordination and gait  ECOG PERFORMANCE STATUS: 1 - Symptomatic but completely ambulatory  There were no vitals taken for this visit.  LABORATORY DATA: Lab Results  Component Value Date   WBC 5.7 08/13/2012   HGB 10.7* 08/13/2012   HCT 31.7* 08/13/2012   MCV 97.7 08/13/2012   PLT 251 08/13/2012      Chemistry      Component Value Date/Time   NA 140 08/04/2012 0840   NA 138 07/09/2012 1408   NA 145 09/06/2011 0902   K 4.6 08/04/2012 0840   K 3.3* 07/09/2012 1408   K 3.9 09/06/2011 0902   CL 109* 08/04/2012 0840   CL 103 07/09/2012 1408   CL 100 09/06/2011 0902   CO2 20* 08/04/2012 0840   CO2 24 07/09/2012 1408   CO2 26 09/06/2011 0902   BUN 13.0 08/04/2012 0840   BUN 15 07/09/2012 1408   BUN 12 09/06/2011 0902   CREATININE 1.0 08/04/2012 0840   CREATININE 0.99 07/09/2012 1408   CREATININE 0.5* 09/06/2011 0902      Component Value Date/Time   CALCIUM 9.9 08/04/2012 0840   CALCIUM 9.6 07/09/2012 1408   CALCIUM 9.5 09/06/2011 0902   ALKPHOS 58 08/04/2012 0840   ALKPHOS 44 07/09/2012 1408   ALKPHOS 55 09/06/2011 0902   AST 18 08/04/2012 0840   AST 19 07/09/2012 1408   AST 24 09/06/2011 0902   ALT 20 08/04/2012 0840   ALT 22 07/09/2012 1408   BILITOT 0.60 08/04/2012 0840   BILITOT 0.7 07/09/2012 1408   BILITOT 0.70 09/06/2011 0902       RADIOGRAPHIC  STUDIES: Ct Chest W Contrast  08/10/2012  *RADIOLOGY REPORT*  Clinical Data:  Follow-up stage IV non-Hodgkins lymphoma. Chemotherapy in progress.  Weakness and chills.  Short of breath.  CT CHEST, ABDOMEN AND PELVIS WITH CONTRAST  Technique:  Multidetector CT imaging of the chest, abdomen and pelvis was performed following the standard protocol during bolus administration of intravenous contrast.  Contrast: OMNIPAQUE IOHEXOL 300 MG/ML  SOLN  Comparison:  06/03/2012   CT CHEST  Findings:  Previously demonstrated bilateral pulmonary emboli are no longer visualized.  Mild right-sided hilar and mediastinal lymphadenopathy remains stable compared to previous study.  No new or increased areas of lymphadenopathy are seen within the thorax.  Moderate pulmonary emphysema again demonstrated.  Postsurgical scarring again seen in the posterior right upper lobe.  Posterior right upper lobe pulmonary nodule measuring 9 x 12 mm show no significant change in size.  Other small less than 1 cm nodular densities in the right middle and lower lobes are also stable.  No new or enlarging pulmonary nodules masses are identified.  There is mild infiltrate in the posterior right lower lobe with fluid or secretions filling the peripheral right lower lobe bronchi, consistent with bronchopneumonia.  No evidence of pleural or pericardial effusion.  IMPRESSION:  1.  Stable right lung nodules, largest measuring 9 x 12 mm in the posterior right upper lobe. 2.  Stable mild right-sided hilar and mediastinal lymphadenopathy. 3.  New mild infiltrate in posterior right lower lobe, consistent with pneumonia. 4.  Moderate pulmonary emphysema.   CT ABDOMEN AND PELVIS  Findings:  A small ill-defined low attenuation lesion in the posterior right hepatic  lobe measuring approximately 1.4 cm remains stable.  Metastatic lesion cannot be excluded.  A small benign cyst dome the right hepatic lobe is stable.  No other liver masses are identified.   Cholelithiasis is again demonstrated, without findings of acute cholecystitis.  Benign left renal cyst is stable.  Several calculi again seen within the right intrarenal collecting system without evidence of hydronephrosis.  No evidence of renal masses.  The adrenal glands, pancreas, and spleen are normal in appearance.  No lymphadenopathy identified within the abdomen or pelvis.  Prior hysterectomy noted.  No evidence of inflammatory process or abnormal fluid collections.  No evidence of bowel wall thickening or dilatation.  No suspicious bone lesions are identified. Multiple injection granulomas noted in the subcutaneous tissues of the anterior abdominal wall.  IMPRESSION:  1.  Stable 1.4 cm hypovascular lesion in the posterior segment of the right hepatic lobe.  Liver metastasis cannot be excluded. 2.  No new or progressive metastatic disease within the abdomen or pelvis. 3.  Stable cholelithiasis and right nephrolithiasis.   Original Report Authenticated By: Danae Orleans, M.D.     ASSESSMENT: This is a very pleasant 66 years old white female with metastatic non-small cell lung cancer, adenocarcinoma status post 6 cycles of systemic chemotherapy with reduced dose carboplatin and Alimta. The patient tolerated the treatment fairly well with no significant adverse effects except for mild fatigue. She has no evidence for disease progression on his recent scan.  PLAN: I discussed the scan results with the patient and her family. I recommended for her to proceed with maintenance chemotherapy with single agent Alimta 500 mg/M2 every 3 weeks. I would monitor her renal function closely during this treatment. I discussed with the patient adverse effect of this treatment including but not limited to alopecia, myelosuppression, nausea and vomiting, liver or renal dysfunction. She would like to proceed with treatment as planned. She will start the first cycle of her treatment today. For the questionable bronchitis  seen on the CT scan of the chest, I would give the patient prescription for Levaquin 500 mg by mouth daily for the next 5 days. She would come back for followup visit in 3 weeks. The patient was advised to call immediately if she has any concerning symptoms in the interval..  All questions were answered. The patient knows to call the clinic with any problems, questions or concerns. We can certainly see the patient much sooner if necessary.  I spent 20 minutes counseling the patient face to face. The total time spent in the appointment was 30 minutes.

## 2012-08-13 NOTE — Patient Instructions (Addendum)
[facility] Discharge Instructions for Patients Receiving Chemotherapy  Today you received the following chemotherapy agents  Alimta  To help prevent nausea and vomiting after your treatment, we encourage you to take your nausea medication  As directed  If you develop nausea and vomiting that is not controlled by your nausea medication, call the clinic. If it is after clinic hours your family physician or the after hours number for the clinic or go to the Emergency Department.   BELOW ARE SYMPTOMS THAT SHOULD BE REPORTED IMMEDIATELY:  *FEVER GREATER THAN 100.5 F  *CHILLS WITH OR WITHOUT FEVER  NAUSEA AND VOMITING THAT IS NOT CONTROLLED WITH YOUR NAUSEA MEDICATION  *UNUSUAL SHORTNESS OF BREATH  *UNUSUAL BRUISING OR BLEEDING  TENDERNESS IN MOUTH AND THROAT WITH OR WITHOUT PRESENCE OF ULCERS  *URINARY PROBLEMS  *BOWEL PROBLEMS  UNUSUAL RASH Items with * indicate a potential emergency and should be followed up as soon as possible.  One of the nurses will contact you 24 hours after your treatment. Please let the nurse know about any problems that you may have experienced. Feel free to call the clinic you have any questions or concerns. The clinic phone number is [phone].   I have been informed and understand all the instructions given to me. I know to contact the clinic, my physician, or go to the Emergency Department if any problems should occur. I do not have any questions at this time, but understand that I may call the clinic during office hours   should I have any questions or need assistance in obtaining follow up care.    __________________________________________  _____________  __________ Signature of Patient or Authorized Representative            Date                   Time    __________________________________________ Nurse's Signature

## 2012-08-13 NOTE — Telephone Encounter (Signed)
CALLED IN REFILL FOR DECADRON AND LEVAQUIN

## 2012-08-15 NOTE — Patient Instructions (Signed)
Your scans showed no evidence for disease progression. We discussed maintenance chemotherapy with single agent Alimta and he would start the first cycle today. I was started on Levaquin for the questionable pneumonia seen on the scan. Followup in 3 weeks

## 2012-09-01 ENCOUNTER — Other Ambulatory Visit (INDEPENDENT_AMBULATORY_CARE_PROVIDER_SITE_OTHER): Payer: Medicare Other

## 2012-09-01 ENCOUNTER — Ambulatory Visit (INDEPENDENT_AMBULATORY_CARE_PROVIDER_SITE_OTHER): Payer: Medicare Other | Admitting: Internal Medicine

## 2012-09-01 ENCOUNTER — Encounter: Payer: Self-pay | Admitting: Internal Medicine

## 2012-09-01 ENCOUNTER — Ambulatory Visit (INDEPENDENT_AMBULATORY_CARE_PROVIDER_SITE_OTHER)
Admission: RE | Admit: 2012-09-01 | Discharge: 2012-09-01 | Disposition: A | Discharge status: Home / Self Care | Payer: Medicare Other | Source: Ambulatory Visit | Attending: Internal Medicine | Admitting: Internal Medicine

## 2012-09-01 VITALS — BP 104/82 | HR 74 | Temp 98.0°F | Resp 18 | Ht 65.0 in | Wt 176.0 lb

## 2012-09-01 DIAGNOSIS — E119 Type 2 diabetes mellitus without complications: Secondary | ICD-10-CM

## 2012-09-01 DIAGNOSIS — R05 Cough: Secondary | ICD-10-CM

## 2012-09-01 DIAGNOSIS — Z1231 Encounter for screening mammogram for malignant neoplasm of breast: Secondary | ICD-10-CM

## 2012-09-01 DIAGNOSIS — E876 Hypokalemia: Secondary | ICD-10-CM

## 2012-09-01 DIAGNOSIS — Z23 Encounter for immunization: Secondary | ICD-10-CM

## 2012-09-01 DIAGNOSIS — I10 Essential (primary) hypertension: Secondary | ICD-10-CM

## 2012-09-01 DIAGNOSIS — F329 Major depressive disorder, single episode, unspecified: Secondary | ICD-10-CM

## 2012-09-01 DIAGNOSIS — C3490 Malignant neoplasm of unspecified part of unspecified bronchus or lung: Secondary | ICD-10-CM

## 2012-09-01 LAB — MAGNESIUM: Magnesium: 1.4 mg/dL — ABNORMAL LOW (ref 1.5–2.5)

## 2012-09-01 LAB — COMPREHENSIVE METABOLIC PANEL
ALT: 21 U/L (ref 0–35)
AST: 31 U/L (ref 0–37)
Albumin: 3.4 g/dL — ABNORMAL LOW (ref 3.5–5.2)
Alkaline Phosphatase: 49 U/L (ref 39–117)
Calcium: 9.3 mg/dL (ref 8.4–10.5)
Chloride: 103 mEq/L (ref 96–112)
Potassium: 3.3 mEq/L — ABNORMAL LOW (ref 3.5–5.1)

## 2012-09-01 NOTE — Patient Instructions (Signed)

## 2012-09-02 ENCOUNTER — Encounter: Payer: Self-pay | Admitting: Internal Medicine

## 2012-09-02 MED ORDER — PROCHLORPERAZINE MALEATE 10 MG PO TABS: 10.0000 mg | ORAL_TABLET | Freq: Four times a day (QID) | ORAL | Status: AC | PRN

## 2012-09-02 MED ORDER — CETIRIZINE HCL 10 MG PO TABS: 10.0000 mg | ORAL_TABLET | Freq: Every day | ORAL | Status: AC | PRN

## 2012-09-02 MED ORDER — EZETIMIBE 10 MG PO TABS: 10.0000 mg | ORAL_TABLET | Freq: Every day | ORAL | Status: AC

## 2012-09-02 MED ORDER — ACLIDINIUM BROMIDE 400 MCG/ACT IN AEPB: 1.0000 | INHALATION_SPRAY | Freq: Two times a day (BID) | RESPIRATORY_TRACT | Status: DC

## 2012-09-02 MED ORDER — BUPROPION HCL ER (XL) 150 MG PO TB24: 150.0000 mg | ORAL_TABLET | Freq: Every day | ORAL | Status: AC

## 2012-09-02 MED ORDER — FERROUS SULFATE 325 (65 FE) MG PO TABS: 325.0000 mg | ORAL_TABLET | Freq: Every day | ORAL | Status: AC

## 2012-09-02 MED ORDER — DULOXETINE HCL 60 MG PO CPEP: 60.0000 mg | ORAL_CAPSULE | Freq: Every day | ORAL | Status: AC

## 2012-09-02 MED ORDER — FOLIC ACID 1 MG PO TABS: 1.0000 mg | ORAL_TABLET | Freq: Every day | ORAL | Status: AC

## 2012-09-02 MED ORDER — DILTIAZEM HCL ER COATED BEADS 120 MG PO CP24: 120.0000 mg | ORAL_CAPSULE | Freq: Every day | ORAL | Status: AC

## 2012-09-02 MED ORDER — MAGNESIUM OXIDE 400 (241.3 MG) MG PO TABS: 400.0000 mg | ORAL_TABLET | Freq: Three times a day (TID) | ORAL | Status: DC

## 2012-09-02 MED ORDER — POTASSIUM CHLORIDE CRYS ER 20 MEQ PO TBCR: 40.0000 meq | EXTENDED_RELEASE_TABLET | Freq: Every day | ORAL | Status: DC

## 2012-09-02 NOTE — Assessment & Plan Note (Signed)
Her a1c is low so I have advised her to stop metformin

## 2012-09-02 NOTE — Addendum Note (Signed)
Addended by: Etta Grandchild on: 09/02/2012 10:48 AM   Modules accepted: Orders

## 2012-09-02 NOTE — Assessment & Plan Note (Signed)
She was recently treated for PNA so I will recheck her CXR today

## 2012-09-02 NOTE — Assessment & Plan Note (Signed)
Her BP is low. Someone else told her to stop cozaar, her K+ and Mg++ are still low and she tells me that she has not been taking the replacement therapy lately so I have asked her to restart.

## 2012-09-02 NOTE — Progress Notes (Signed)
Subjective:    Patient ID: Veronica Duarte, female    DOB: 1946/01/27, 66 y.o.   MRN: [id number]  Diabetes She presents for her follow-up diabetic visit. She has type 2 diabetes mellitus. Her disease course has been stable. There are no hypoglycemic associated symptoms. Pertinent negatives for hypoglycemia include no headaches, pallor or sweats. Pertinent negatives for diabetes include no blurred vision, no chest pain, no fatigue, no foot paresthesias, no foot ulcerations, no polydipsia, no polyphagia, no polyuria, no visual change, no weakness and no weight loss. There are no hypoglycemic complications. Symptoms are stable. Current diabetic treatment includes oral agent (monotherapy). She is compliant with treatment all of the time. Her weight is stable. She is following a generally unhealthy diet. When asked about meal planning, she reported none. She has not had a previous visit with a dietician. She never participates in exercise. There is no change in her home blood glucose trend. An ACE inhibitor/angiotensin II receptor blocker is contraindicated. She does not see a podiatrist.Eye exam is not current.  Cough This is a chronic problem. The current episode started more than 1 year ago. The problem has been unchanged. The problem occurs every few hours. The cough is non-productive. Pertinent negatives include no chest pain, chills, ear congestion, ear pain, fever, headaches, heartburn, hemoptysis, myalgias, nasal congestion, postnasal drip, rash, rhinorrhea, sore throat, shortness of breath, sweats, weight loss or wheezing. Risk factors for lung disease include smoking/tobacco exposure. She has tried OTC cough suppressant, a beta-agonist inhaler and steroid inhaler for the symptoms. The treatment provided moderate relief. Her past medical history is significant for COPD and pneumonia.      Review of Systems  Constitutional: Negative for fever, chills, weight loss, activity change, appetite change,  fatigue and unexpected weight change.  HENT: Negative.  Negative for ear pain, sore throat, rhinorrhea and postnasal drip.   Eyes: Negative.  Negative for blurred vision.  Respiratory: Positive for cough. Negative for apnea, hemoptysis, choking, chest tightness, shortness of breath, wheezing and stridor.   Cardiovascular: Negative for chest pain, palpitations and leg swelling.  Gastrointestinal: Negative for heartburn, nausea, vomiting, abdominal pain, diarrhea, constipation and blood in stool.  Genitourinary: Negative.  Negative for polyuria.  Musculoskeletal: Negative.  Negative for myalgias, back pain, joint swelling, arthralgias and gait problem.  Skin: Negative for color change, pallor, rash and wound.  Neurological: Negative.  Negative for weakness and headaches.  Hematological: Negative for polydipsia, polyphagia and adenopathy. Does not bruise/bleed easily.  Psychiatric/Behavioral: Negative.        Objective:   Physical Exam  Vitals reviewed. Constitutional: She is oriented to person, place, and time. She appears well-developed and well-nourished.  Non-toxic appearance. She does not have a sickly appearance. She does not appear ill. No distress.  HENT:  Head: Normocephalic and atraumatic.  Mouth/Throat: Oropharynx is clear and moist. No oropharyngeal exudate.  Eyes: Conjunctivae normal are normal. Right eye exhibits no discharge. Left eye exhibits no discharge. No scleral icterus.  Neck: Normal range of motion. Neck supple. No JVD present. No tracheal deviation present. No thyromegaly present.  Cardiovascular: Normal rate, regular rhythm, normal heart sounds and intact distal pulses.  Exam reveals no gallop and no friction rub.   No murmur heard. Pulmonary/Chest: Effort normal and breath sounds normal. No stridor. No respiratory distress. She has no wheezes. She has no rales. She exhibits no tenderness.  Abdominal: Soft. Bowel sounds are normal. She exhibits no distension and no  mass. There is no tenderness. There is  no rebound and no guarding.  Musculoskeletal: Normal range of motion. She exhibits no edema and no tenderness.  Lymphadenopathy:    She has no cervical adenopathy.  Neurological: She is oriented to person, place, and time.  Skin: Skin is warm and dry. No rash noted. She is not diaphoretic. No erythema. No pallor.  Psychiatric: She has a normal mood and affect. Her behavior is normal. Judgment and thought content normal.     Lab Results  Component Value Date   WBC 5.7 08/13/2012   HGB 10.7* 08/13/2012   HCT 31.7* 08/13/2012   PLT 251 08/13/2012   GLUCOSE 84 09/01/2012   CHOL 109 12/31/2011   TRIG 65.0 12/31/2011   HDL 62.90 12/31/2011   LDLCALC 33 12/31/2011   ALT 21 09/01/2012   AST 31 09/01/2012   NA 140 09/01/2012   K 3.3* 09/01/2012   CL 103 09/01/2012   CREATININE 1.0 09/01/2012   BUN 8 09/01/2012   CO2 27 09/01/2012   TSH 1.47 12/31/2011   INR 1.06 04/02/2012   HGBA1C 5.1 09/01/2012   MICROALBUR 20.6* 12/31/2011       Assessment & Plan:

## 2012-09-03 ENCOUNTER — Ambulatory Visit (HOSPITAL_BASED_OUTPATIENT_CLINIC_OR_DEPARTMENT_OTHER): Payer: Medicare Other

## 2012-09-03 ENCOUNTER — Other Ambulatory Visit (HOSPITAL_BASED_OUTPATIENT_CLINIC_OR_DEPARTMENT_OTHER): Payer: Medicare Other

## 2012-09-03 ENCOUNTER — Other Ambulatory Visit: Payer: Self-pay | Admitting: Medical Oncology

## 2012-09-03 ENCOUNTER — Ambulatory Visit (HOSPITAL_BASED_OUTPATIENT_CLINIC_OR_DEPARTMENT_OTHER): Payer: Medicare Other | Admitting: Internal Medicine

## 2012-09-03 VITALS — BP 120/72 | HR 73 | Temp 97.1°F | Resp 22 | Ht 65.0 in | Wt 175.2 lb

## 2012-09-03 DIAGNOSIS — I2699 Other pulmonary embolism without acute cor pulmonale: Secondary | ICD-10-CM

## 2012-09-03 DIAGNOSIS — C3410 Malignant neoplasm of upper lobe, unspecified bronchus or lung: Secondary | ICD-10-CM

## 2012-09-03 DIAGNOSIS — Z7901 Long term (current) use of anticoagulants: Secondary | ICD-10-CM

## 2012-09-03 DIAGNOSIS — C3490 Malignant neoplasm of unspecified part of unspecified bronchus or lung: Secondary | ICD-10-CM

## 2012-09-03 DIAGNOSIS — Z5111 Encounter for antineoplastic chemotherapy: Secondary | ICD-10-CM

## 2012-09-03 DIAGNOSIS — I82409 Acute embolism and thrombosis of unspecified deep veins of unspecified lower extremity: Secondary | ICD-10-CM

## 2012-09-03 LAB — CBC WITH DIFFERENTIAL/PLATELET
Eosinophils Absolute: 0 10*3/uL (ref 0.0–0.5)
HCT: 32.7 % — ABNORMAL LOW (ref 34.8–46.6)
LYMPH%: 10.8 % — ABNORMAL LOW (ref 14.0–49.7)
MCHC: 32.4 g/dL (ref 31.5–36.0)
MCV: 95.6 fL (ref 79.5–101.0)
MONO#: 0.5 10*3/uL (ref 0.1–0.9)
MONO%: 7.6 % (ref 0.0–14.0)
NEUT#: 5.3 10*3/uL (ref 1.5–6.5)
NEUT%: 81.4 % — ABNORMAL HIGH (ref 38.4–76.8)
Platelets: 229 10*3/uL (ref 145–400)
RBC: 3.42 10*6/uL — ABNORMAL LOW (ref 3.70–5.45)
nRBC: 0 % (ref 0–0)

## 2012-09-03 LAB — COMPREHENSIVE METABOLIC PANEL (CC13)
ALT: 19 U/L (ref 0–55)
AST: 20 U/L (ref 5–34)
Chloride: 106 mEq/L (ref 98–107)
Creatinine: 0.8 mg/dL (ref 0.6–1.1)
Sodium: 141 mEq/L (ref 136–145)
Total Bilirubin: 0.4 mg/dL (ref 0.20–1.20)
Total Protein: 6.1 g/dL — ABNORMAL LOW (ref 6.4–8.3)

## 2012-09-03 MED ORDER — HEPARIN SOD (PORK) LOCK FLUSH 100 UNIT/ML IV SOLN
500.0000 [IU] | Freq: Once | INTRAVENOUS | Status: AC | PRN
  Administered 2012-09-03: 500 [IU]
  Filled 2012-09-03: qty 5

## 2012-09-03 MED ORDER — SODIUM CHLORIDE 0.9 % IJ SOLN
10.0000 mL | INTRAMUSCULAR | Status: DC | PRN
  Administered 2012-09-03: 10 mL
  Filled 2012-09-03: qty 10

## 2012-09-03 MED ORDER — FONDAPARINUX SODIUM 7.5 MG/0.6ML ~~LOC~~ SOLN: 7.5000 mg | SUBCUTANEOUS | Status: DC

## 2012-09-03 MED ORDER — SODIUM CHLORIDE 0.9 % IV SOLN
Freq: Once | INTRAVENOUS | Status: AC
  Administered 2012-09-03: 11:00:00 via INTRAVENOUS

## 2012-09-03 MED ORDER — PEMETREXED DISODIUM CHEMO INJECTION 500 MG
500.0000 mg/m2 | Freq: Once | INTRAVENOUS | Status: AC
  Administered 2012-09-03: 950 mg via INTRAVENOUS
  Filled 2012-09-03: qty 38

## 2012-09-03 MED ORDER — ONDANSETRON 8 MG/50ML IVPB (CHCC)
8.0000 mg | Freq: Once | INTRAVENOUS | Status: AC
  Administered 2012-09-03: 8 mg via INTRAVENOUS

## 2012-09-03 MED ORDER — DEXAMETHASONE SODIUM PHOSPHATE 10 MG/ML IJ SOLN
10.0000 mg | Freq: Once | INTRAMUSCULAR | Status: AC
  Administered 2012-09-03: 10 mg via INTRAVENOUS

## 2012-09-03 NOTE — Telephone Encounter (Signed)
Gave pt's daughter appt calendar for November lab, ML and chemo

## 2012-09-03 NOTE — Progress Notes (Signed)
[facility] [phone]   [phone]  OFFICE PROGRESS NOTE  [name], MD [address] [facility] [address] [place] [facility] [place], [address] [place] [address]  PRINCIPAL DIAGNOSIS:  1) Metastatic non-small cell lung cancer, adenocarcinoma initially diagnosed as stage IB (T2a, N0, N0) in March 2012.  2) bilateral pulmonary emboli and deep venous thrombosis of the left lower extremity diagnosed on 06/03/2012   PRIOR THERAPY:  1. Status post wedge resection of the right upper lobe under the care of Dr. Laneta Simmers on February 14, 2011.  2. Lovenox 80 mg subcutaneous twice a day. First dose today. 3. Systemic chemotherapy with carboplatin for AUC of 5 and Alimta 500 mg/M2 every 3 weeks, the patient is status post 6 cycles. Current dose of Alimta is 375 mg per meter squared   CURRENT THERAPY:  1) Maintenance Alimta 500 mg/M2 every 3 weeks, status post 1 cycle .  2) Arixtra 7.5 mg subcutaneously daily.  INTERVAL HISTORY: MALETA PACHA 66 y.o. female returns to the clinic today for followup visit accompanied by her 2 daughters. The patient is feeling fine today with no specific complaints except for mild fatigue. She has some rough time with the last cycle of her maintenance chemotherapy with Alimta, but at the same time she was under treatment for urinary tract infection. She was started on treatment with Macrodantin by his urologist. She denied having any significant nausea or vomiting, no fever or chills, no significant weight loss or night sweats. The patient denied having any significant chest pain, shortness breath, cough or hemoptysis.  MEDICAL HISTORY: Past Medical History  Diagnosis Date  . Diabetes mellitus     type 2- metformin  . ADENOCARCINOMA, RIGHT LUNG, UPPER LOBE 02/04/2011  . Depression   . RAS (renal artery stenosis)   . AAA (abdominal aortic aneurysm)   . Osteoarthritis   . COPD (chronic obstructive pulmonary disease)   . Hx of colonic polyps   . CAD  (coronary artery disease)   . OSA (obstructive sleep apnea)     Uses CPAP pressure is 6 study was done > 5 years  . Hypertension     Sees Dr. Tresa Endo with [facility]    ALLERGIES:   has no known allergies.  MEDICATIONS:  Current Outpatient Prescriptions  Medication Sig Dispense Refill  . Aclidinium Bromide (TUDORZA PRESSAIR) 400 MCG/ACT AEPB Inhale 1 puff into the lungs 2 (two) times daily.  1 each  6  . buPROPion (WELLBUTRIN XL) 150 MG 24 hr tablet Take 1 tablet (150 mg total) by mouth daily.  90 tablet  3  . cetirizine (ZYRTEC) 10 MG tablet Take 1 tablet (10 mg total) by mouth daily as needed. For allergies  90 tablet  3  . dexamethasone (DECADRON) 4 MG tablet Take 1 tablet (4 mg total) by mouth See admin instructions. 1 tab BID day before of and after chemo  40 tablet  0  . diltiazem (CARDIZEM CD) 120 MG 24 hr capsule Take 1 capsule (120 mg total) by mouth daily.  90 capsule  3  . DULoxetine (CYMBALTA) 60 MG capsule Take 1 capsule (60 mg total) by mouth daily.  90 capsule  3  . ezetimibe (ZETIA) 10 MG tablet Take 1 tablet (10 mg total) by mouth daily.  90 tablet  3  . ferrous sulfate 325 (65 FE) MG tablet Take 1 tablet (325 mg total) by mouth daily with breakfast.  90 tablet  3  .  folic acid (FOLVITE) 1 MG tablet Take 1 tablet (1 mg total) by mouth daily.  90 tablet  3  . fondaparinux (ARIXTRA) 7.5 MG/0.6ML SOLN Inject 7.5 mg into the skin daily.      Marland Kitchen guaiFENesin (MUCINEX) 600 MG 12 hr tablet Take 600 mg by mouth 2 (two) times daily as needed. For congestion      . lidocaine-prilocaine (EMLA) cream Apply 1 application topically as needed. Apply to port 1 hour before chemo appointment      . magnesium oxide (MAG-OX) 400 (241.3 MG) MG tablet Take 1 tablet (400 mg total) by mouth 3 (three) times daily.  180 tablet  3  . Multiple Vitamin (MULITIVITAMIN WITH MINERALS) TABS Take 1 tablet by mouth daily.      . Nebivolol HCl (BYSTOLIC) 20 MG TABS Take 1 tablet (20 mg total) by mouth daily.  30 tablet     . niacin (NIASPAN) 1000 MG CR tablet Take 1,000 mg by mouth at bedtime.       . nitrofurantoin, macrocrystal-monohydrate, (MACROBID) 100 MG capsule Take 100 mg by mouth at bedtime.      . potassium chloride SA (K-DUR,KLOR-CON) 20 MEQ tablet Take 2 tablets (40 mEq total) by mouth daily.  180 tablet  3  . SYMBICORT 160-4.5 MCG/ACT inhaler INHALE 2 PUFFS INTO THE LUNGS 2 TIMES   DAILY  10.2 g  5  . vitamin C (ASCORBIC ACID) 500 MG tablet Take 500 mg by mouth 2 (two) times daily.       Marland Kitchen albuterol (PROVENTIL HFA;VENTOLIN HFA) 108 (90 BASE) MCG/ACT inhaler Inhale 2 puffs into the lungs every 6 (six) hours as needed. For shortness of breath      . metFORMIN (GLUCOPHAGE) 500 MG tablet Take 500 mg by mouth Twice daily.      Marland Kitchen PRESCRIPTION MEDICATION Inject into the vein every 21 ( twenty-one) days. Alimta and Carboplatin every 3 weeks.      . prochlorperazine (COMPAZINE) 10 MG tablet Take 1 tablet (10 mg total) by mouth every 6 (six) hours as needed. For nausea  30 tablet  11    SURGICAL HISTORY:  Past Surgical History  Procedure Date  . 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 Vesicovaginal fistula closure w/ tah   . Abdominal hysterectomy   . Right video-assisted thoracoscopy with wedge resection of 02/14/11    Dr Evelene Croon  . Cardiac catheterization 08/30/2009    Dr Aleen Campi  . Intraoperative transesophageal echocardiography. 08/30/2009    Dr Kipp Brood  . Mediastinoscopy 04/03/2012    Procedure: MEDIASTINOSCOPY;  Surgeon: Alleen Borne, MD;  Location: [facility] OR;  Service: Thoracic;  Laterality: N/A;  . Portacath placement 04/03/2012    Procedure: INSERTION PORT-A-CATH;  Surgeon: Alleen Borne, MD;  Location: [facility] OR;  Service: Thoracic;  Laterality: Left;  Insertion of 9.6Fr. Pre-attached power port in Left Subclavian    REVIEW OF SYSTEMS:  A comprehensive review of systems was negative except for: Constitutional: positive for  fatigue   PHYSICAL EXAMINATION: General appearance: alert, cooperative, fatigued and no distress Head: Normocephalic, without obvious abnormality, atraumatic Neck: no adenopathy Lymph nodes: Cervical, supraclavicular, and axillary nodes normal. Resp: clear to auscultation bilaterally Cardio: regular rate and rhythm, S1, S2 normal, no murmur, click, rub or gallop GI: soft, non-tender; bowel sounds normal; no masses,  no organomegaly Extremities: extremities normal, atraumatic, no cyanosis or edema Neurologic: Alert and oriented X 3, normal strength  and tone. Normal symmetric reflexes. Normal coordination and gait  ECOG PERFORMANCE STATUS: 2 - Symptomatic, <50% confined to bed  Blood pressure 120/72, pulse 73, temperature 97.1 F (36.2 C), temperature source Oral, resp. rate 22, height 5\' 5"  (1.651 m), weight 175 lb 3.2 oz (79.47 kg).  LABORATORY DATA: Lab Results  Component Value Date   WBC 6.5 09/03/2012   HGB 10.6* 09/03/2012   HCT 32.7* 09/03/2012   MCV 95.6 09/03/2012   PLT 229 09/03/2012      Chemistry      Component Value Date/Time   NA 140 09/01/2012 1107   NA 141 08/13/2012 1011   NA 145 09/06/2011 0902   K 3.3* 09/01/2012 1107   K 3.9 08/13/2012 1011   K 3.9 09/06/2011 0902   CL 103 09/01/2012 1107   CL 107 08/13/2012 1011   CL 100 09/06/2011 0902   CO2 27 09/01/2012 1107   CO2 20* 08/13/2012 1011   CO2 26 09/06/2011 0902   BUN 8 09/01/2012 1107   BUN 10.0 08/13/2012 1011   BUN 12 09/06/2011 0902   CREATININE 1.0 09/01/2012 1107   CREATININE 1.0 08/13/2012 1011   CREATININE 0.5* 09/06/2011 0902      Component Value Date/Time   CALCIUM 9.3 09/01/2012 1107   CALCIUM 10.0 08/13/2012 1011   CALCIUM 9.5 09/06/2011 0902   ALKPHOS 49 09/01/2012 1107   ALKPHOS 58 08/13/2012 1011   ALKPHOS 55 09/06/2011 0902   AST 31 09/01/2012 1107   AST 18 08/13/2012 1011   AST 24 09/06/2011 0902   ALT 21 09/01/2012 1107   ALT 18 08/13/2012 1011   BILITOT 0.6 09/01/2012 1107    BILITOT 0.40 08/13/2012 1011   BILITOT 0.70 09/06/2011 0902       RADIOGRAPHIC STUDIES: Dg Chest 2 View  09/01/2012  *RADIOLOGY REPORT*  Clinical Data: Cough, pneumonia, lung cancer  CHEST - 2 VIEW  Comparison: 08/10/2012 and 04/17/2012  Findings: Cardiomediastinal silhouette is stable.  Left subclavian Port-A-Cath with tip in SVC.  No acute infiltrate or pulmonary edema.  A nodule in the right upper lobe measures 12.2 mm stable from 08/10/2012.  Postsurgical changes are noted in the right upper lobe.  Stable degenerative changes thoracic spine.  IMPRESSION: No active disease.  Stable nodule in the right upper lobe. Postsurgical changes in the right upper lobe.   Original Report Authenticated By: Natasha Mead, M.D.    Ct Chest W Contrast  08/10/2012  *RADIOLOGY REPORT*  Clinical Data:  Follow-up stage IV non-Hodgkins lymphoma. Chemotherapy in progress.  Weakness and chills.  Short of breath.  CT CHEST, ABDOMEN AND PELVIS WITH CONTRAST  Technique:  Multidetector CT imaging of the chest, abdomen and pelvis was performed following the standard protocol during bolus administration of intravenous contrast.  Contrast: OMNIPAQUE IOHEXOL 300 MG/ML  SOLN  Comparison:  06/03/2012  CT CHEST  Findings:  Previously demonstrated bilateral pulmonary emboli are no longer visualized.  Mild right-sided hilar and mediastinal lymphadenopathy remains stable compared to previous study.  No new or increased areas of lymphadenopathy are seen within the thorax.  Moderate pulmonary emphysema again demonstrated.  Postsurgical scarring again seen in the posterior right upper lobe.  Posterior right upper lobe pulmonary nodule measuring 9 x 12 mm show no significant change in size.  Other small less than 1 cm nodular densities in the right middle and lower lobes are also stable.  No new or enlarging pulmonary nodules masses are identified.  There is mild infiltrate in  the posterior right lower lobe with fluid or secretions filling  the peripheral right lower lobe bronchi, consistent with bronchopneumonia.  No evidence of pleural or pericardial effusion.  IMPRESSION:  1.  Stable right lung nodules, largest measuring 9 x 12 mm in the posterior right upper lobe. 2.  Stable mild right-sided hilar and mediastinal lymphadenopathy. 3.  New mild infiltrate in posterior right lower lobe, consistent with pneumonia. 4.  Moderate pulmonary emphysema.  CT ABDOMEN AND PELVIS  Findings:  A small ill-defined low attenuation lesion in the posterior right hepatic lobe measuring approximately 1.4 cm remains stable.  Metastatic lesion cannot be excluded.  A small benign cyst dome the right hepatic lobe is stable.  No other liver masses are identified.  Cholelithiasis is again demonstrated, without findings of acute cholecystitis.  Benign left renal cyst is stable.  Several calculi again seen within the right intrarenal collecting system without evidence of hydronephrosis.  No evidence of renal masses.  The adrenal glands, pancreas, and spleen are normal in appearance.  No lymphadenopathy identified within the abdomen or pelvis.  Prior hysterectomy noted.  No evidence of inflammatory process or abnormal fluid collections.  No evidence of bowel wall thickening or dilatation.  No suspicious bone lesions are identified. Multiple injection granulomas noted in the subcutaneous tissues of the anterior abdominal wall.  IMPRESSION:  1.  Stable 1.4 cm hypovascular lesion in the posterior segment of the right hepatic lobe.  Liver metastasis cannot be excluded. 2.  No new or progressive metastatic disease within the abdomen or pelvis. 3.  Stable cholelithiasis and right nephrolithiasis.   Original Report Authenticated By: Danae Orleans, M.D.    Ct Abdomen Pelvis W Contrast  08/10/2012  *RADIOLOGY REPORT*  Clinical Data:  Follow-up stage IV non-Hodgkins lymphoma. Chemotherapy in progress.  Weakness and chills.  Short of breath.  CT CHEST, ABDOMEN AND PELVIS WITH CONTRAST   Technique:  Multidetector CT imaging of the chest, abdomen and pelvis was performed following the standard protocol during bolus administration of intravenous contrast.  Contrast: OMNIPAQUE IOHEXOL 300 MG/ML  SOLN  Comparison:  06/03/2012  CT CHEST  Findings:  Previously demonstrated bilateral pulmonary emboli are no longer visualized.  Mild right-sided hilar and mediastinal lymphadenopathy remains stable compared to previous study.  No new or increased areas of lymphadenopathy are seen within the thorax.  Moderate pulmonary emphysema again demonstrated.  Postsurgical scarring again seen in the posterior right upper lobe.  Posterior right upper lobe pulmonary nodule measuring 9 x 12 mm show no significant change in size.  Other small less than 1 cm nodular densities in the right middle and lower lobes are also stable.  No new or enlarging pulmonary nodules masses are identified.  There is mild infiltrate in the posterior right lower lobe with fluid or secretions filling the peripheral right lower lobe bronchi, consistent with bronchopneumonia.  No evidence of pleural or pericardial effusion.  IMPRESSION:  1.  Stable right lung nodules, largest measuring 9 x 12 mm in the posterior right upper lobe. 2.  Stable mild right-sided hilar and mediastinal lymphadenopathy. 3.  New mild infiltrate in posterior right lower lobe, consistent with pneumonia. 4.  Moderate pulmonary emphysema.  CT ABDOMEN AND PELVIS  Findings:  A small ill-defined low attenuation lesion in the posterior right hepatic lobe measuring approximately 1.4 cm remains stable.  Metastatic lesion cannot be excluded.  A small benign cyst dome the right hepatic lobe is stable.  No other liver masses are identified.  Cholelithiasis is again demonstrated, without findings of acute cholecystitis.  Benign left renal cyst is stable.  Several calculi again seen within the right intrarenal collecting system without evidence of hydronephrosis.  No evidence of  renal masses.  The adrenal glands, pancreas, and spleen are normal in appearance.  No lymphadenopathy identified within the abdomen or pelvis.  Prior hysterectomy noted.  No evidence of inflammatory process or abnormal fluid collections.  No evidence of bowel wall thickening or dilatation.  No suspicious bone lesions are identified. Multiple injection granulomas noted in the subcutaneous tissues of the anterior abdominal wall.  IMPRESSION:  1.  Stable 1.4 cm hypovascular lesion in the posterior segment of the right hepatic lobe.  Liver metastasis cannot be excluded. 2.  No new or progressive metastatic disease within the abdomen or pelvis. 3.  Stable cholelithiasis and right nephrolithiasis.   Original Report Authenticated By: Danae Orleans, M.D.     ASSESSMENT: This is a very pleasant 66 years old white female with metastatic non-small cell lung cancer, adenocarcinoma status post 6 cycles of systemic chemotherapy with carboplatin and Alimta and currently undergoing maintenance chemotherapy with single agent Alimta status post 1 cycle. The patient related the first cycle fairly well except for increasing fatigue and weakness which in part could be secondary to her urinary tract infection that happened during the same period.  PLAN: I recommended for the patient to continue on her current treatment with single agent Alimta at the same dose. If she continues to have significant fatigue and weakness I may consider reducing her Alimta dose to 375 mg/M2 every 3 weeks. The patient will continue her treatment with Arixtra for the history of deep venous thrombosis and bilateral pulmonary emboli. She would come back for followup visit in 3 weeks with the start of the next cycle of her chemotherapy.  All questions were answered. The patient knows to call the clinic with any problems, questions or concerns. We can certainly see the patient much sooner if necessary.  I spent 15 minutes counseling the patient face to  face. The total time spent in the appointment was 25 minutes.

## 2012-09-03 NOTE — Patient Instructions (Addendum)
Your labwork is stable today. We'll proceed with the second cycle of maintenance chemotherapy with Alimta as scheduled. Followup in 3 weeks with the next cycle of chemotherapy.

## 2012-09-03 NOTE — Telephone Encounter (Signed)
Faxed arixtra rx to [facility] access program . Pt ID number - [id number]

## 2012-09-03 NOTE — Patient Instructions (Signed)
[facility] Discharge Instructions for Patients Receiving Chemotherapy  Today you received the following chemotherapy agents : Alimta  To help prevent nausea and vomiting after your treatment, we encourage you to take your nausea medication as directed by your MD. If you develop nausea and vomiting that is not controlled by your nausea medication, call the clinic. If it is after clinic hours your family physician or the after hours number for the clinic or go to the Emergency Department.   BELOW ARE SYMPTOMS THAT SHOULD BE REPORTED IMMEDIATELY:  *FEVER GREATER THAN 100.5 F  *CHILLS WITH OR WITHOUT FEVER  NAUSEA AND VOMITING THAT IS NOT CONTROLLED WITH YOUR NAUSEA MEDICATION  *UNUSUAL SHORTNESS OF BREATH  *UNUSUAL BRUISING OR BLEEDING  TENDERNESS IN MOUTH AND THROAT WITH OR WITHOUT PRESENCE OF ULCERS  *URINARY PROBLEMS  *BOWEL PROBLEMS  UNUSUAL RASH Items with * indicate a potential emergency and should be followed up as soon as possible.   Feel free to call the clinic you have any questions or concerns. The clinic phone number is [phone].    

## 2012-09-08 ENCOUNTER — Encounter (HOSPITAL_COMMUNITY): Payer: Self-pay | Admitting: *Deleted

## 2012-09-08 ENCOUNTER — Inpatient Hospital Stay (HOSPITAL_COMMUNITY): Payer: Medicare Other

## 2012-09-08 ENCOUNTER — Emergency Department (HOSPITAL_COMMUNITY): Payer: Medicare Other

## 2012-09-08 ENCOUNTER — Inpatient Hospital Stay (HOSPITAL_COMMUNITY)
Admission: EM | Admit: 2012-09-08 | Discharge: 2012-09-17 | DRG: 689 | Disposition: A | Discharge status: Skilled Nursing Facility | Payer: Medicare Other | Attending: Internal Medicine | Admitting: Internal Medicine

## 2012-09-08 DIAGNOSIS — Z79899 Other long term (current) drug therapy: Secondary | ICD-10-CM

## 2012-09-08 DIAGNOSIS — G4730 Sleep apnea, unspecified: Secondary | ICD-10-CM

## 2012-09-08 DIAGNOSIS — F329 Major depressive disorder, single episode, unspecified: Secondary | ICD-10-CM | POA: Diagnosis present

## 2012-09-08 DIAGNOSIS — Z87891 Personal history of nicotine dependence: Secondary | ICD-10-CM

## 2012-09-08 DIAGNOSIS — M25559 Pain in unspecified hip: Secondary | ICD-10-CM | POA: Diagnosis present

## 2012-09-08 DIAGNOSIS — G9340 Encephalopathy, unspecified: Secondary | ICD-10-CM | POA: Diagnosis present

## 2012-09-08 DIAGNOSIS — F068 Other specified mental disorders due to known physiological condition: Secondary | ICD-10-CM | POA: Diagnosis present

## 2012-09-08 DIAGNOSIS — Y92009 Unspecified place in unspecified non-institutional (private) residence as the place of occurrence of the external cause: Secondary | ICD-10-CM

## 2012-09-08 DIAGNOSIS — Z902 Acquired absence of lung [part of]: Secondary | ICD-10-CM

## 2012-09-08 DIAGNOSIS — R531 Weakness: Secondary | ICD-10-CM

## 2012-09-08 DIAGNOSIS — G4733 Obstructive sleep apnea (adult) (pediatric): Secondary | ICD-10-CM | POA: Diagnosis present

## 2012-09-08 DIAGNOSIS — E860 Dehydration: Secondary | ICD-10-CM | POA: Diagnosis present

## 2012-09-08 DIAGNOSIS — Z86711 Personal history of pulmonary embolism: Secondary | ICD-10-CM

## 2012-09-08 DIAGNOSIS — C3410 Malignant neoplasm of upper lobe, unspecified bronchus or lung: Secondary | ICD-10-CM | POA: Diagnosis present

## 2012-09-08 DIAGNOSIS — E1165 Type 2 diabetes mellitus with hyperglycemia: Secondary | ICD-10-CM | POA: Diagnosis present

## 2012-09-08 DIAGNOSIS — I2699 Other pulmonary embolism without acute cor pulmonale: Secondary | ICD-10-CM | POA: Diagnosis present

## 2012-09-08 DIAGNOSIS — E1129 Type 2 diabetes mellitus with other diabetic kidney complication: Secondary | ICD-10-CM | POA: Diagnosis present

## 2012-09-08 DIAGNOSIS — Z9221 Personal history of antineoplastic chemotherapy: Secondary | ICD-10-CM

## 2012-09-08 DIAGNOSIS — B961 Klebsiella pneumoniae [K. pneumoniae] as the cause of diseases classified elsewhere: Secondary | ICD-10-CM | POA: Diagnosis present

## 2012-09-08 DIAGNOSIS — D709 Neutropenia, unspecified: Secondary | ICD-10-CM | POA: Clinically undetermined

## 2012-09-08 DIAGNOSIS — K625 Hemorrhage of anus and rectum: Secondary | ICD-10-CM | POA: Diagnosis not present

## 2012-09-08 DIAGNOSIS — F3289 Other specified depressive episodes: Secondary | ICD-10-CM | POA: Diagnosis present

## 2012-09-08 DIAGNOSIS — J4489 Other specified chronic obstructive pulmonary disease: Secondary | ICD-10-CM | POA: Diagnosis present

## 2012-09-08 DIAGNOSIS — Z86718 Personal history of other venous thrombosis and embolism: Secondary | ICD-10-CM

## 2012-09-08 DIAGNOSIS — F29 Unspecified psychosis not due to a substance or known physiological condition: Secondary | ICD-10-CM | POA: Diagnosis present

## 2012-09-08 DIAGNOSIS — I10 Essential (primary) hypertension: Secondary | ICD-10-CM | POA: Diagnosis present

## 2012-09-08 DIAGNOSIS — Z951 Presence of aortocoronary bypass graft: Secondary | ICD-10-CM

## 2012-09-08 DIAGNOSIS — M1990 Unspecified osteoarthritis, unspecified site: Secondary | ICD-10-CM

## 2012-09-08 DIAGNOSIS — Z6829 Body mass index (BMI) 29.0-29.9, adult: Secondary | ICD-10-CM

## 2012-09-08 DIAGNOSIS — Z1231 Encounter for screening mammogram for malignant neoplasm of breast: Secondary | ICD-10-CM

## 2012-09-08 DIAGNOSIS — J449 Chronic obstructive pulmonary disease, unspecified: Secondary | ICD-10-CM

## 2012-09-08 DIAGNOSIS — E876 Hypokalemia: Secondary | ICD-10-CM

## 2012-09-08 DIAGNOSIS — R05 Cough: Secondary | ICD-10-CM

## 2012-09-08 DIAGNOSIS — I2510 Atherosclerotic heart disease of native coronary artery without angina pectoris: Secondary | ICD-10-CM | POA: Diagnosis present

## 2012-09-08 DIAGNOSIS — E871 Hypo-osmolality and hyponatremia: Secondary | ICD-10-CM | POA: Diagnosis present

## 2012-09-08 DIAGNOSIS — W19XXXA Unspecified fall, initial encounter: Secondary | ICD-10-CM | POA: Diagnosis present

## 2012-09-08 DIAGNOSIS — E46 Unspecified protein-calorie malnutrition: Secondary | ICD-10-CM | POA: Diagnosis present

## 2012-09-08 DIAGNOSIS — E785 Hyperlipidemia, unspecified: Secondary | ICD-10-CM | POA: Diagnosis present

## 2012-09-08 DIAGNOSIS — W010XXA Fall on same level from slipping, tripping and stumbling without subsequent striking against object, initial encounter: Secondary | ICD-10-CM | POA: Diagnosis present

## 2012-09-08 DIAGNOSIS — D649 Anemia, unspecified: Secondary | ICD-10-CM | POA: Diagnosis present

## 2012-09-08 DIAGNOSIS — N390 Urinary tract infection, site not specified: Principal | ICD-10-CM | POA: Diagnosis present

## 2012-09-08 DIAGNOSIS — R0902 Hypoxemia: Secondary | ICD-10-CM | POA: Diagnosis present

## 2012-09-08 DIAGNOSIS — T451X5A Adverse effect of antineoplastic and immunosuppressive drugs, initial encounter: Secondary | ICD-10-CM | POA: Diagnosis present

## 2012-09-08 DIAGNOSIS — Z7901 Long term (current) use of anticoagulants: Secondary | ICD-10-CM

## 2012-09-08 DIAGNOSIS — N058 Unspecified nephritic syndrome with other morphologic changes: Secondary | ICD-10-CM | POA: Diagnosis present

## 2012-09-08 DIAGNOSIS — J189 Pneumonia, unspecified organism: Secondary | ICD-10-CM | POA: Diagnosis present

## 2012-09-08 LAB — CBC WITH DIFFERENTIAL/PLATELET
Eosinophils Absolute: 0 10*3/uL (ref 0.0–0.7)
Hemoglobin: 9.7 g/dL — ABNORMAL LOW (ref 12.0–15.0)
Lymphocytes Relative: 36 % (ref 12–46)
Lymphs Abs: 0.5 10*3/uL — ABNORMAL LOW (ref 0.7–4.0)
Neutro Abs: 0.8 10*3/uL — ABNORMAL LOW (ref 1.7–7.7)
Neutrophils Relative %: 60 % (ref 43–77)
Platelets: 190 10*3/uL (ref 150–400)
RBC: 3.05 MIL/uL — ABNORMAL LOW (ref 3.87–5.11)
WBC: 1.3 10*3/uL — CL (ref 4.0–10.5)

## 2012-09-08 LAB — URINE MICROSCOPIC-ADD ON

## 2012-09-08 LAB — COMPREHENSIVE METABOLIC PANEL
ALT: 13 U/L (ref 0–35)
AST: 22 U/L (ref 0–37)
Alkaline Phosphatase: 51 U/L (ref 39–117)
CO2: 26 mEq/L (ref 19–32)
Calcium: 9.1 mg/dL (ref 8.4–10.5)
Chloride: 95 mEq/L — ABNORMAL LOW (ref 96–112)
GFR calc non Af Amer: 71 mL/min — ABNORMAL LOW (ref >90)
Potassium: 3.7 mEq/L (ref 3.5–5.1)
Sodium: 132 mEq/L — ABNORMAL LOW (ref 135–145)

## 2012-09-08 LAB — URINALYSIS, ROUTINE W REFLEX MICROSCOPIC
Glucose, UA: NEGATIVE mg/dL
Ketones, ur: 15 mg/dL — AB
pH: 6.5 (ref 5.0–8.0)

## 2012-09-08 LAB — GLUCOSE, CAPILLARY: Glucose-Capillary: 107 mg/dL — ABNORMAL HIGH (ref 70–99)

## 2012-09-08 MED ORDER — EZETIMIBE 10 MG PO TABS
10.0000 mg | ORAL_TABLET | Freq: Every day | ORAL | Status: DC
  Administered 2012-09-09 – 2012-09-17 (×8): 10 mg via ORAL
  Filled 2012-09-08 (×9): qty 1

## 2012-09-08 MED ORDER — ALBUTEROL SULFATE (5 MG/ML) 0.5% IN NEBU
5.0000 mg | INHALATION_SOLUTION | Freq: Once | RESPIRATORY_TRACT | Status: AC
  Administered 2012-09-08: 5 mg via RESPIRATORY_TRACT
  Filled 2012-09-08: qty 1

## 2012-09-08 MED ORDER — NEBIVOLOL HCL 10 MG PO TABS
20.0000 mg | ORAL_TABLET | Freq: Every day | ORAL | Status: DC
  Administered 2012-09-09 – 2012-09-10 (×2): 20 mg via ORAL
  Filled 2012-09-08 (×2): qty 2

## 2012-09-08 MED ORDER — BUPROPION HCL ER (XL) 150 MG PO TB24
150.0000 mg | ORAL_TABLET | Freq: Every day | ORAL | Status: DC
  Administered 2012-09-09 – 2012-09-17 (×9): 150 mg via ORAL
  Filled 2012-09-08 (×9): qty 1

## 2012-09-08 MED ORDER — SODIUM CHLORIDE 0.9 % IV SOLN
1000.0000 mL | Freq: Once | INTRAVENOUS | Status: AC
  Administered 2012-09-08: 1000 mL via INTRAVENOUS

## 2012-09-08 MED ORDER — ACETAMINOPHEN 650 MG RE SUPP: 650.0000 mg | Freq: Four times a day (QID) | RECTAL | Status: DC | PRN

## 2012-09-08 MED ORDER — MAGNESIUM OXIDE 400 (241.3 MG) MG PO TABS
400.0000 mg | ORAL_TABLET | Freq: Every day | ORAL | Status: DC
  Administered 2012-09-08 – 2012-09-16 (×9): 400 mg via ORAL
  Filled 2012-09-08 (×11): qty 1

## 2012-09-08 MED ORDER — INSULIN ASPART 100 UNIT/ML ~~LOC~~ SOLN
0.0000 [IU] | Freq: Three times a day (TID) | SUBCUTANEOUS | Status: DC
  Administered 2012-09-16: 1 [IU] via SUBCUTANEOUS

## 2012-09-08 MED ORDER — SODIUM CHLORIDE 0.9 % IV SOLN
1000.0000 mL | INTRAVENOUS | Status: DC
  Administered 2012-09-08: 1000 mL via INTRAVENOUS

## 2012-09-08 MED ORDER — ONDANSETRON HCL 4 MG PO TABS: 4.0000 mg | ORAL_TABLET | Freq: Four times a day (QID) | ORAL | Status: DC | PRN

## 2012-09-08 MED ORDER — ACETAMINOPHEN 325 MG PO TABS: 650.0000 mg | ORAL_TABLET | Freq: Four times a day (QID) | ORAL | Status: DC | PRN

## 2012-09-08 MED ORDER — FONDAPARINUX SODIUM 7.5 MG/0.6ML ~~LOC~~ SOLN
7.5000 mg | Freq: Every day | SUBCUTANEOUS | Status: DC
  Administered 2012-09-08 – 2012-09-16 (×9): 7.5 mg via SUBCUTANEOUS
  Filled 2012-09-08 (×10): qty 0.6

## 2012-09-08 MED ORDER — ONDANSETRON HCL 4 MG/2ML IJ SOLN: 4.0000 mg | Freq: Four times a day (QID) | INTRAMUSCULAR | Status: DC | PRN

## 2012-09-08 MED ORDER — MAGNESIUM OXIDE 400 (241.3 MG) MG PO TABS
800.0000 mg | ORAL_TABLET | Freq: Every day | ORAL | Status: DC
  Administered 2012-09-09 – 2012-09-17 (×9): 800 mg via ORAL
  Filled 2012-09-08 (×9): qty 2

## 2012-09-08 MED ORDER — TIOTROPIUM BROMIDE MONOHYDRATE 18 MCG IN CAPS
18.0000 ug | ORAL_CAPSULE | Freq: Every day | RESPIRATORY_TRACT | Status: DC
  Administered 2012-09-09 – 2012-09-17 (×8): 18 ug via RESPIRATORY_TRACT
  Filled 2012-09-08 (×2): qty 5

## 2012-09-08 MED ORDER — ALBUTEROL SULFATE HFA 108 (90 BASE) MCG/ACT IN AERS
2.0000 | INHALATION_SPRAY | Freq: Four times a day (QID) | RESPIRATORY_TRACT | Status: DC | PRN
  Administered 2012-09-08: 2 via RESPIRATORY_TRACT
  Filled 2012-09-08: qty 6.7

## 2012-09-08 MED ORDER — ATORVASTATIN CALCIUM 20 MG PO TABS
20.0000 mg | ORAL_TABLET | Freq: Every day | ORAL | Status: DC
  Administered 2012-09-09 – 2012-09-16 (×8): 20 mg via ORAL
  Filled 2012-09-08 (×9): qty 1

## 2012-09-08 MED ORDER — IPRATROPIUM BROMIDE 0.02 % IN SOLN
0.5000 mg | Freq: Once | RESPIRATORY_TRACT | Status: AC
  Administered 2012-09-08: 0.5 mg via RESPIRATORY_TRACT
  Filled 2012-09-08: qty 2.5

## 2012-09-08 MED ORDER — DEXTROSE 5 % IV SOLN
1.0000 g | Freq: Once | INTRAVENOUS | Status: AC
  Administered 2012-09-08: 1 g via INTRAVENOUS
  Filled 2012-09-08: qty 10

## 2012-09-08 MED ORDER — DEXAMETHASONE 4 MG PO TABS: 4.0000 mg | ORAL_TABLET | ORAL | Status: DC

## 2012-09-08 MED ORDER — BUDESONIDE-FORMOTEROL FUMARATE 160-4.5 MCG/ACT IN AERO
2.0000 | INHALATION_SPRAY | Freq: Two times a day (BID) | RESPIRATORY_TRACT | Status: DC
  Administered 2012-09-08 – 2012-09-17 (×17): 2 via RESPIRATORY_TRACT
  Filled 2012-09-08: qty 6

## 2012-09-08 MED ORDER — SODIUM CHLORIDE 0.9 % IV SOLN
INTRAVENOUS | Status: DC
  Administered 2012-09-08: via INTRAVENOUS
  Administered 2012-09-11: 50 mL/h via INTRAVENOUS
  Administered 2012-09-12 – 2012-09-14 (×3): via INTRAVENOUS

## 2012-09-08 MED ORDER — FERROUS SULFATE 325 (65 FE) MG PO TABS
325.0000 mg | ORAL_TABLET | Freq: Every day | ORAL | Status: DC
  Administered 2012-09-09 – 2012-09-17 (×9): 325 mg via ORAL
  Filled 2012-09-08 (×11): qty 1

## 2012-09-08 MED ORDER — ACLIDINIUM BROMIDE 400 MCG/ACT IN AEPB: 1.0000 | INHALATION_SPRAY | Freq: Two times a day (BID) | RESPIRATORY_TRACT | Status: DC

## 2012-09-08 MED ORDER — FOLIC ACID 1 MG PO TABS
1.0000 mg | ORAL_TABLET | Freq: Every day | ORAL | Status: DC
  Administered 2012-09-09 – 2012-09-17 (×9): 1 mg via ORAL
  Filled 2012-09-08 (×9): qty 1

## 2012-09-08 MED ORDER — ONDANSETRON HCL 4 MG/2ML IJ SOLN: 4.0000 mg | Freq: Once | INTRAMUSCULAR | Status: DC

## 2012-09-08 MED ORDER — SODIUM CHLORIDE 0.9 % IV SOLN: INTRAVENOUS | Status: AC

## 2012-09-08 MED ORDER — MEMANTINE HCL 10 MG PO TABS
10.0000 mg | ORAL_TABLET | Freq: Every day | ORAL | Status: DC
  Administered 2012-09-09 – 2012-09-17 (×9): 10 mg via ORAL
  Filled 2012-09-08 (×9): qty 1

## 2012-09-08 MED ORDER — DILTIAZEM HCL ER COATED BEADS 120 MG PO CP24
120.0000 mg | ORAL_CAPSULE | Freq: Every day | ORAL | Status: DC
  Administered 2012-09-09 – 2012-09-17 (×8): 120 mg via ORAL
  Filled 2012-09-08 (×9): qty 1

## 2012-09-08 MED ORDER — DULOXETINE HCL 60 MG PO CPEP
60.0000 mg | ORAL_CAPSULE | Freq: Every day | ORAL | Status: DC
  Administered 2012-09-09 – 2012-09-17 (×9): 60 mg via ORAL
  Filled 2012-09-08 (×9): qty 1

## 2012-09-08 MED ORDER — ACETAMINOPHEN 325 MG PO TABS
650.0000 mg | ORAL_TABLET | ORAL | Status: DC | PRN
  Filled 2012-09-08: qty 2

## 2012-09-08 NOTE — ED Notes (Signed)
Pt alert and oriented x4. Respirations even and unlabored, bilateral symmetrical rise and fall of chest. Skin warm and dry. In no acute distress. Denies needs.   

## 2012-09-08 NOTE — H&P (Addendum)
[facility] History and Physical  Veronica Duarte:[id number] DOB: 1946-09-10 DOA: 09/08/2012  Referring physician: Dr Effie Shy PCP: [name], MD  Specialists: Dr Shirline Frees  Chief Complaint: fall at home, found on the floor.   HPI: Veronica Duarte is a 66 y.o. female with prior h/o metastatic lung cancer s/p chemo, and wedge resection, COPD, her last chemo was last week, was brought to ED by EMS as her daughter and son in law found her on the floor. Patient at baseline dementia, today appears to be more confused than usual. She reports that she tried to get out of bed to go to the bathroom and fell down. She denies any dizziness or LOC. She denies any chest pain or shortness of breath. She reports worsening cough.she reports right hip pain and right flank pain. Patient;s daughter reports she has been having loose bowel movements over the last 2 to 3 days. She was recently diagnosed with DVT and bilateral PE and was started on arixtra. Further evaluation in ED shows mild hyponatremia, neutropenia, and UTI. She is being admitted to hospitalist service for evaluation and management of UTI.    Review of Systems: The patient denies anorexia, fever, weight loss,, vision loss, decreased hearing, hoarseness, chest pain, syncope,peripheral edema, balance deficits, hemoptysis, abdominal pain, melena, hematochezia, severe indigestion/heartburn, hematuria, incontinence, genital sores, , suspicious skin lesions, transient blindness, difficulty walking, depression, unusual weight change, abnormal bleeding, enlarged lymph nodes, angioedema, and breast masses.    Past Medical History  Diagnosis Date  . Diabetes mellitus     type 2- metformin  . ADENOCARCINOMA, RIGHT LUNG, UPPER LOBE 02/04/2011  . Depression   . RAS (renal artery stenosis)   . AAA (abdominal aortic aneurysm)   . Osteoarthritis   . COPD (chronic obstructive pulmonary disease)   . Hx of colonic polyps   . CAD (coronary artery disease)    . OSA (obstructive sleep apnea)     Uses CPAP pressure is 6 study was done > 5 years  . Hypertension     Sees Dr. Tresa Endo with [facility]   Past Surgical History  Procedure Date  . 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 Vesicovaginal fistula closure w/ tah   . Abdominal hysterectomy   . Right video-assisted thoracoscopy with wedge resection of 02/14/11    Dr Evelene Croon  . Cardiac catheterization 08/30/2009    Dr Aleen Campi  . Intraoperative transesophageal echocardiography. 08/30/2009    Dr Kipp Brood  . Mediastinoscopy 04/03/2012    Procedure: MEDIASTINOSCOPY;  Surgeon: Alleen Borne, MD;  Location: [facility] OR;  Service: Thoracic;  Laterality: N/A;  . Portacath placement 04/03/2012    Procedure: INSERTION PORT-A-CATH;  Surgeon: Alleen Borne, MD;  Location: [facility] OR;  Service: Thoracic;  Laterality: Left;  Insertion of 9.6Fr. Pre-attached power port in Left Subclavian   Social History:  reports that she quit smoking about 9 years ago. Her smoking use included Cigarettes. She has a 60 pack-year smoking history. She has never used smokeless tobacco. She reports that she does not drink alcohol or use illicit drugs.  where does patient live--home,and with DAUGHTER  Can patient participate in ADLs? yes  No Known Allergies  Family History  Problem Relation Age of Onset  . Arthritis    . Hyperlipidemia    . Hypertension    . Depression    . Anesthesia problems Neg Hx     Prior to Admission medications  Medication Sig Start Date End Date Taking? Authorizing Provider  Aclidinium Bromide (TUDORZA PRESSAIR) 400 MCG/ACT AEPB Inhale 1 puff into the lungs 2 (two) times daily. 09/02/12  Yes Etta Grandchild, MD  albuterol (PROVENTIL HFA;VENTOLIN HFA) 108 (90 BASE) MCG/ACT inhaler Inhale 2 puffs into the lungs every 6 (six) hours as needed. For shortness of breath 08/15/11  Yes Waymon Budge, MD  buPROPion (WELLBUTRIN XL) 150 MG 24 hr tablet  Take 1 tablet (150 mg total) by mouth daily. 09/02/12 09/02/13 Yes Etta Grandchild, MD  cetirizine (ZYRTEC) 10 MG tablet Take 1 tablet (10 mg total) by mouth daily as needed. For allergies 09/02/12  Yes Etta Grandchild, MD  dexamethasone (DECADRON) 4 MG tablet Take 4 mg by mouth See admin instructions. 1 tab BID day before, during, and after chemo 08/13/12  Yes Si Gaul, MD  diltiazem (CARDIZEM CD) 120 MG 24 hr capsule Take 1 capsule (120 mg total) by mouth daily. 09/02/12  Yes Etta Grandchild, MD  DULoxetine (CYMBALTA) 60 MG capsule Take 1 capsule (60 mg total) by mouth daily. 09/02/12  Yes Etta Grandchild, MD  ezetimibe (ZETIA) 10 MG tablet Take 1 tablet (10 mg total) by mouth daily. 09/02/12  Yes Etta Grandchild, MD  ferrous sulfate 325 (65 FE) MG tablet Take 1 tablet (325 mg total) by mouth daily with breakfast. 09/02/12  Yes Etta Grandchild, MD  folic acid (FOLVITE) 1 MG tablet Take 1 tablet (1 mg total) by mouth daily. 09/02/12 09/02/13 Yes Etta Grandchild, MD  fondaparinux (ARIXTRA) 7.5 MG/0.6ML SOLN Inject 0.6 mLs (7.5 mg total) into the skin daily. 09/03/12  Yes Si Gaul, MD  guaiFENesin (MUCINEX) 600 MG 12 hr tablet Take 600 mg by mouth 2 (two) times daily. For congestion   Yes Historical Provider, MD  lidocaine-prilocaine (EMLA) cream Apply 1 application topically as needed. Apply to port 1 hour before chemo appointment 04/01/12 04/01/13 Yes Si Gaul, MD  magnesium oxide (MAG-OX) 400 (241.3 MG) MG tablet Take 400-800 mg by mouth 2 (two) times daily. Takes 2 tablets in the morning and 1 tablet at night 09/02/12 09/02/13 Yes Etta Grandchild, MD  Memantine HCl ER (NAMENDA XR) 14 MG CP24 Take 1 tablet by mouth daily.   Yes Historical Provider, MD  Multiple Vitamin (MULITIVITAMIN WITH MINERALS) TABS Take 1 tablet by mouth daily.   Yes Historical Provider, MD  Nebivolol HCl (BYSTOLIC) 20 MG TABS Take 1 tablet (20 mg total) by mouth daily. 09/01/12  Yes Etta Grandchild, MD  niacin  (NIASPAN) 1000 MG CR tablet Take 1,000 mg by mouth at bedtime.    Yes Historical Provider, MD  nitrofurantoin, macrocrystal-monohydrate, (MACROBID) 100 MG capsule Take 100 mg by mouth at bedtime.   Yes Historical Provider, MD  PRESCRIPTION MEDICATION Inject into the vein every 21 ( twenty-one) days. Alimta and Carboplatin every 3 weeks.   Yes Historical Provider, MD  prochlorperazine (COMPAZINE) 10 MG tablet Take 1 tablet (10 mg total) by mouth every 6 (six) hours as needed. For nausea 09/02/12 09/09/14 Yes Etta Grandchild, MD  rosuvastatin (CRESTOR) 20 MG tablet Take 20 mg by mouth daily.   Yes Historical Provider, MD  SYMBICORT 160-4.5 MCG/ACT inhaler INHALE 2 PUFFS INTO THE LUNGS 2 TIMES   DAILY 07/21/12  Yes Etta Grandchild, MD  vitamin C (ASCORBIC ACID) 500 MG tablet Take 500 mg by mouth 2 (two) times daily.    Yes Historical Provider, MD   Physical Exam:  Filed Vitals:   09/08/12 2010 09/08/12 2011 09/08/12 2014 09/08/12 2204  BP: 123/60 115/73 114/58 122/58  Pulse: 77 86 91 78  Temp:      TempSrc:      Resp:    16  SpO2:    93%    Constitutional: Vital signs reviewed.  Patient is a well-developed and well-nourished  in no acute distress and cooperative with exam. Alert and oriented to person and place only.  Head: Normocephalic and atraumatic Mouth: no erythema or exudates, dry MM Eyes: PERRL, EOMI, conjunctivae normal, No scleral icterus.  Neck: Supple, Trachea midline normal ROM, No JVD, mass, thyromegaly, or carotid bruit present.  Cardiovascular: RRR, S1 normal, S2 normal, no MRG, pulses symmetric and intact bilaterally Pulmonary/Chest: CTAB, no wheezes, rales,. Scattered rhonchi. Abdominal: Soft. Non-tender, non-distended, bowel sounds are normal, no masses, organomegaly, or guarding present.  Musculoskeletal: No joint deformities, erythema, or stiffness, ROM full and no nontender Neurological: A&Oto person and place.Strength is normal and symmetric bilaterally, cranial nerve  II-XII are grossly intact, no focal motor deficit, sensory intact to light touch bilaterally.  Skin: Warm, dry and intact. No rash, cyanosis, or clubbing.  Psychiatric: Normal mood and affect.   Labs on Admission:  Basic Metabolic Panel:  Lab 09/08/12 0865 09/03/12 0918  NA 132* 141  K 3.7 3.8  CL 95* 106  CO2 26 19*  GLUCOSE 108* 133*  BUN 18 11.0  CREATININE 0.83 0.8  CALCIUM 9.1 9.8  MG -- 1.8  PHOS -- --   Liver Function Tests:  Lab 09/08/12 1645 09/03/12 0918  AST 22 20  ALT 13 19  ALKPHOS 51 66  BILITOT 0.6 0.40  PROT 6.0 6.1*  ALBUMIN 2.9* 3.4*   No results found for this basename: LIPASE:5,AMYLASE:5 in the last 168 hours No results found for this basename: AMMONIA:5 in the last 168 hours CBC:  Lab 09/08/12 1645 09/03/12 0917  WBC 1.3* 6.5  NEUTROABS 0.8* 5.3  HGB 9.7* 10.6*  HCT 28.3* 32.7*  MCV 92.8 95.6  PLT 190 229   Cardiac Enzymes: No results found for this basename: CKTOTAL:5,CKMB:5,CKMBINDEX:5,TROPONINI:5 in the last 168 hours  BNP (last 3 results) No results found for this basename: PROBNP:3 in the last 8760 hours CBG:  Lab 09/08/12 1549  GLUCAP 107*    Radiological Exams on Admission: Dg Chest Port 1 View  09/08/2012  *RADIOLOGY REPORT*  Clinical Data: Altered mental status  PORTABLE CHEST - 1 VIEW  Comparison: 09/01/2012  Findings: Cardiomediastinal silhouette is stable.  Status post median sternotomy.  Stable left Port-A-Cath position.  Stable nodule in the right upper lobe. No acute infiltrate or pulmonary edema.  IMPRESSION: Status post median sternotomy. No active disease.  Stable nodule in the right upper lobe.   Original Report Authenticated By: Natasha Mead, M.D.     EKG:  Assessment/Plan Active Problems: 1. Fall at home: probably secondary to dehydration, as her orthostatic vitals are positive. - fluids - fall precautions - CT head without contrast ordered.    2. AMS;  - Probably from UTI and worsening dementia. - resume  namenda  3. UTI: - Urine cultures ordered - started on zosyn.   4. Metastatic Lung cancer - further treatment as per Dr Shirline Frees.   5. Bilateral PE and DVT: - continue with arixtra.  6. Hyponatremia: - probably from dehydration, gentle hydration and repeat labs in am.   7. COPD/ Mild hypoxemia of admission/  - resume nebs prn - CPAP at night  -  oxygen as needed.  8. Right flank pain: probably from UTI.   9.Right Hip pain:  - will get x rays of the right hip. - pain control  10. Loose stools:  - with prior h/o c diff infection.  - will rule out c diff.   11. DVT prophylaxis; On arixtra.    Code Status: full code Family Communication: daughter at bedside Disposition Plan: 2 to 3 days.   Time spent: 56 min  Dailyn Kempner [facility] Pager [phone]  If 7PM-7AM, please contact night-coverage [email or web address] Password [email or web address] 09/08/2012, 11:05 PM

## 2012-09-08 NOTE — ED Notes (Signed)
Orthostatic vitals completed was going to attempt to walk pt but she could barely stand for ortho vitals. RN was in the room

## 2012-09-08 NOTE — ED Notes (Signed)
Per ems pt is from home. Alert and oriented x4. Pt hx of lung cancer. Hx of CAD. Pt reports she got up this morning and "felt groggy". Pt tried to get out of bed and "slid to the floor and was unable to get up on her own".

## 2012-09-08 NOTE — ED Notes (Signed)
Pt unable to stand without assist.  Unable to amb pt.  Pt sob.  Per pt family "all of this is new"  Pt baseline is ambulatory and able to perform ADl's per pt daughter

## 2012-09-08 NOTE — ED Provider Notes (Addendum)
History     CSN: [id number]  Arrival date & time 09/08/12  1524   First MD Initiated Contact with Patient 09/08/12 1603      Chief Complaint  Patient presents with  . Weakness    (Consider location/radiation/quality/duration/timing/severity/associated sxs/prior treatment) HPI Comments: Veronica Duarte is a 66 y.o. Female who presents for altered mental status. She was found on floor next to the bed, in her home today. Patient states that she slid to the floor. Family members found her and  felt like she was more confused than usual. She has not eaten today. No other recent illnesses or problems. She is actively getting chemotherapy for lung cancer.   Level V Caveat- dementia  Patient is a 66 y.o. female presenting with weakness. The history is provided by the patient.  Weakness  Additional symptoms include weakness.    Past Medical History  Diagnosis Date  . Diabetes mellitus     type 2- metformin  . ADENOCARCINOMA, RIGHT LUNG, UPPER LOBE 02/04/2011  . Depression   . RAS (renal artery stenosis)   . AAA (abdominal aortic aneurysm)   . Osteoarthritis   . COPD (chronic obstructive pulmonary disease)   . Hx of colonic polyps   . CAD (coronary artery disease)   . OSA (obstructive sleep apnea)     Uses CPAP pressure is 6 study was done > 5 years  . Hypertension     Sees Dr. Tresa Endo with [facility]    Past Surgical History  Procedure Date  . 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 Vesicovaginal fistula closure w/ tah   . Abdominal hysterectomy   . Right video-assisted thoracoscopy with wedge resection of 02/14/11    Dr Evelene Croon  . Cardiac catheterization 08/30/2009    Dr Aleen Campi  . Intraoperative transesophageal echocardiography. 08/30/2009    Dr Kipp Brood  . Mediastinoscopy 04/03/2012    Procedure: MEDIASTINOSCOPY;  Surgeon: Alleen Borne, MD;  Location: [facility] OR;  Service: Thoracic;  Laterality: N/A;  . Portacath  placement 04/03/2012    Procedure: INSERTION PORT-A-CATH;  Surgeon: Alleen Borne, MD;  Location: [facility] OR;  Service: Thoracic;  Laterality: Left;  Insertion of 9.6Fr. Pre-attached power port in Left Subclavian    Family History  Problem Relation Age of Onset  . Arthritis    . Hyperlipidemia    . Hypertension    . Depression    . Anesthesia problems Neg Hx     History  Substance Use Topics  . Smoking status: Former Smoker -- 2.0 packs/day for 30 years    Types: Cigarettes    Quit date: 11/18/2002  . Smokeless tobacco: Never Used  . Alcohol Use: No    OB History    Grav Para Term Preterm Abortions TAB SAB Ect Mult Living                  Review of Systems  Unable to perform ROS Neurological: Positive for weakness.    Allergies  Review of patient's allergies indicates no known allergies.  Home Medications   Current Outpatient Rx  Name Route Sig Dispense Refill  . ACLIDINIUM BROMIDE 400 MCG/ACT IN AEPB Inhalation Inhale 1 puff into the lungs 2 (two) times daily. 1 each 6  . ALBUTEROL SULFATE HFA 108 (90 BASE) MCG/ACT IN AERS Inhalation Inhale 2 puffs into the lungs every 6 (six) hours as needed. For shortness of breath    . BUPROPION  HCL ER (XL) 150 MG PO TB24 Oral Take 1 tablet (150 mg total) by mouth daily. 90 tablet 3  . CETIRIZINE HCL 10 MG PO TABS Oral Take 1 tablet (10 mg total) by mouth daily as needed. For allergies 90 tablet 3  . DEXAMETHASONE 4 MG PO TABS Oral Take 4 mg by mouth See admin instructions. 1 tab BID day before, during, and after chemo    . DILTIAZEM HCL ER COATED BEADS 120 MG PO CP24 Oral Take 1 capsule (120 mg total) by mouth daily. 90 capsule 3  . DULOXETINE HCL 60 MG PO CPEP Oral Take 1 capsule (60 mg total) by mouth daily. 90 capsule 3  . EZETIMIBE 10 MG PO TABS Oral Take 1 tablet (10 mg total) by mouth daily. 90 tablet 3  . FERROUS SULFATE 325 (65 FE) MG PO TABS Oral Take 1 tablet (325 mg total) by mouth daily with breakfast. 90 tablet 3  . FOLIC  ACID 1 MG PO TABS Oral Take 1 tablet (1 mg total) by mouth daily. 90 tablet 3  . FONDAPARINUX SODIUM 7.5 MG/0.6ML [place] SOLN Subcutaneous Inject 0.6 mLs (7.5 mg total) into the skin daily. 90 Syringe 0  . GUAIFENESIN ER 600 MG PO TB12 Oral Take 600 mg by mouth 2 (two) times daily. For congestion    . LIDOCAINE-PRILOCAINE 2.5-2.5 % EX CREA Topical Apply 1 application topically as needed. Apply to port 1 hour before chemo appointment    . MAGNESIUM OXIDE 400 (241.3 MG) MG PO TABS Oral Take 400-800 mg by mouth 2 (two) times daily. Takes 2 tablets in the morning and 1 tablet at night    . MEMANTINE HCL ER 14 MG PO CP24 Oral Take 1 tablet by mouth daily.    . ADULT MULTIVITAMIN W/MINERALS CH Oral Take 1 tablet by mouth daily.    . NEBIVOLOL HCL 20 MG PO TABS Oral Take 1 tablet (20 mg total) by mouth daily. 30 tablet   . NIACIN ER (ANTIHYPERLIPIDEMIC) 1000 MG PO TBCR Oral Take 1,000 mg by mouth at bedtime.     Marland Kitchen NITROFURANTOIN MONOHYD MACRO 100 MG PO CAPS Oral Take 100 mg by mouth at bedtime.    Marland Kitchen PRESCRIPTION MEDICATION Intravenous Inject into the vein every 21 ( twenty-one) days. Alimta and Carboplatin every 3 weeks.    Marland Kitchen PROCHLORPERAZINE MALEATE 10 MG PO TABS Oral Take 1 tablet (10 mg total) by mouth every 6 (six) hours as needed. For nausea 30 tablet 11  . ROSUVASTATIN CALCIUM 20 MG PO TABS Oral Take 20 mg by mouth daily.    . SYMBICORT 160-4.5 MCG/ACT IN AERO  INHALE 2 PUFFS INTO THE LUNGS 2 TIMES   DAILY 10.2 g 5  . VITAMIN C 500 MG PO TABS Oral Take 500 mg by mouth 2 (two) times daily.       BP 114/58  Pulse 91  Temp 97.1 F (36.2 C) (Rectal)  Resp 16  SpO2 97%  Physical Exam  Nursing note and vitals reviewed. Constitutional: She appears well-developed.       Frail  HENT:  Head: Normocephalic and atraumatic.  Eyes: Conjunctivae normal and EOM are normal. Pupils are equal, round, and reactive to light.  Neck: Normal range of motion and phonation normal. Neck supple.  Cardiovascular:  Normal rate, regular rhythm and intact distal pulses.        Normotensive  Pulmonary/Chest: Effort normal and breath sounds normal. She exhibits no tenderness.  Abdominal: Soft. She exhibits no  distension. There is no tenderness. There is no guarding.  Musculoskeletal: Normal range of motion. She exhibits no edema and no tenderness.  Neurological: She is alert. She has normal strength. No cranial nerve deficit. She exhibits normal muscle tone.       Mild right-sided arm tremor, stated, to be chronic, by family  Skin: Skin is dry. No rash noted. No pallor.       Skin warm to touch  Psychiatric: Her behavior is normal.    ED Course  Procedures (including critical care time)  Initial oxygen saturation 100% on 2 L nasal cannula. Oxygen, removed, and the oxygen saturation dropped to 89% on room air after about 7 minutes. Oxygen replaced, and sats 100%   Altered mental status, with dementia, and lung cancer. Mild hypoxia improves with nasal cannula.     Date: 09/04/2012  Rate: 77  Rhythm: normal sinus rhythm  QRS Axis: normal  PR and QT Intervals: normal  ST/T Wave abnormalities: nonspecific T wave changes  PR and QRS Conduction Disutrbances:none  Narrative Interpretation:   Old EKG Reviewed: changes noted- new T wave abnormality      CRITICAL CARE Performed by: Mancel Bale L   Total critical care time: 35 minutes  Critical care time was exclusive of separately billable procedures and treating other patients.  Critical care was necessary to treat or prevent imminent or life-threatening deterioration.  Critical care was time spent personally by me on the following activities: development of treatment plan with patient and/or surrogate as well as nursing, discussions with consultants, evaluation of patient's response to treatment, examination of patient, obtaining history from patient or surrogate, ordering and performing treatments and interventions, ordering and review of  laboratory studies, ordering and review of radiographic studies, pulse oximetry and re-evaluation of patient's condition.  Labs Reviewed  GLUCOSE, CAPILLARY - Abnormal; Notable for the following:    Glucose-Capillary 107 (*)     All other components within normal limits  CBC WITH DIFFERENTIAL - Abnormal; Notable for the following:    WBC 1.3 (*)     RBC 3.05 (*)     Hemoglobin 9.7 (*)     HCT 28.3 (*)     Neutro Abs 0.8 (*)     Lymphs Abs 0.5 (*)     Monocytes Relative 2 (*)     Monocytes Absolute 0.0 (*)     All other components within normal limits  COMPREHENSIVE METABOLIC PANEL - Abnormal; Notable for the following:    Sodium 132 (*)     Chloride 95 (*)     Glucose, Bld 108 (*)     Albumin 2.9 (*)     GFR calc non Af Amer 71 (*)     GFR calc Af Amer 82 (*)     All other components within normal limits  URINALYSIS, ROUTINE W REFLEX MICROSCOPIC - Abnormal; Notable for the following:    Color, Urine AMBER (*)  BIOCHEMICALS MAY BE AFFECTED BY COLOR   APPearance CLOUDY (*)     Hgb urine dipstick SMALL (*)     Ketones, ur 15 (*)     Protein, ur 100 (*)     Leukocytes, UA LARGE (*)     All other components within normal limits  URINE MICROSCOPIC-ADD ON - Abnormal; Notable for the following:    Squamous Epithelial / LPF FEW (*)     Bacteria, UA FEW (*)     All other components within normal limits  LACTIC ACID, PLASMA  PROCALCITONIN  URINE CULTURE   Dg Chest Port 1 View  09/08/2012  *RADIOLOGY REPORT*  Clinical Data: Altered mental status  PORTABLE CHEST - 1 VIEW  Comparison: 09/01/2012  Findings: Cardiomediastinal silhouette is stable.  Status post median sternotomy.  Stable left Port-A-Cath position.  Stable nodule in the right upper lobe. No acute infiltrate or pulmonary edema.  IMPRESSION: Status post median sternotomy. No active disease.  Stable nodule in the right upper lobe.   Original Report Authenticated By: Natasha Mead, M.D.      1. UTI (lower urinary tract infection)    2. Weakness   3. Hypoxia   4. Sleep apnea       MDM  Urinary tract infection, with leukopenia, secondary to chemotherapy. Patient is weak, and unable to walk in the emergency department. She is to be admitted for stabilization  Plan: Admit- Hospitalist      Flint Melter, MD 09/08/12 4098  Flint Melter, MD 09/08/12 2306

## 2012-09-08 NOTE — ED Notes (Signed)
RUE:[id number]<JW> Expected date:<BR> Expected time:<BR> Means of arrival:<BR> Comments:<BR> Lung ca, can&#39;t stand

## 2012-09-09 ENCOUNTER — Inpatient Hospital Stay (HOSPITAL_COMMUNITY): Payer: Medicare Other

## 2012-09-09 ENCOUNTER — Encounter (HOSPITAL_COMMUNITY): Payer: Self-pay | Admitting: Radiology

## 2012-09-09 DIAGNOSIS — E876 Hypokalemia: Secondary | ICD-10-CM

## 2012-09-09 DIAGNOSIS — W19XXXA Unspecified fall, initial encounter: Secondary | ICD-10-CM | POA: Diagnosis present

## 2012-09-09 DIAGNOSIS — J189 Pneumonia, unspecified organism: Secondary | ICD-10-CM | POA: Diagnosis present

## 2012-09-09 DIAGNOSIS — G9340 Encephalopathy, unspecified: Secondary | ICD-10-CM | POA: Diagnosis present

## 2012-09-09 DIAGNOSIS — D649 Anemia, unspecified: Secondary | ICD-10-CM | POA: Diagnosis present

## 2012-09-09 DIAGNOSIS — D709 Neutropenia, unspecified: Secondary | ICD-10-CM | POA: Clinically undetermined

## 2012-09-09 DIAGNOSIS — E860 Dehydration: Secondary | ICD-10-CM | POA: Diagnosis present

## 2012-09-09 LAB — CBC
HCT: 25.7 % — ABNORMAL LOW (ref 36.0–46.0)
MCV: 92.8 fL (ref 78.0–100.0)
RBC: 2.77 MIL/uL — ABNORMAL LOW (ref 3.87–5.11)
RDW: 14 % (ref 11.5–15.5)
WBC: 1 10*3/uL — CL (ref 4.0–10.5)

## 2012-09-09 LAB — BASIC METABOLIC PANEL
CO2: 24 mEq/L (ref 19–32)
Chloride: 96 mEq/L (ref 96–112)
Creatinine, Ser: 0.72 mg/dL (ref 0.50–1.10)
GFR calc Af Amer: >90 mL/min (ref >90)
Potassium: 2.8 mEq/L — ABNORMAL LOW (ref 3.5–5.1)
Sodium: 130 mEq/L — ABNORMAL LOW (ref 135–145)

## 2012-09-09 LAB — GLUCOSE, CAPILLARY: Glucose-Capillary: 99 mg/dL (ref 70–99)

## 2012-09-09 LAB — CLOSTRIDIUM DIFFICILE BY PCR: Toxigenic C. Difficile by PCR: NEGATIVE

## 2012-09-09 LAB — HEMOGLOBIN A1C
Hgb A1c MFr Bld: 5.5 % (ref <5.7)
Mean Plasma Glucose: 111 mg/dL (ref <117)

## 2012-09-09 LAB — MAGNESIUM: Magnesium: 1.5 mg/dL (ref 1.5–2.5)

## 2012-09-09 MED ORDER — ALBUTEROL SULFATE (5 MG/ML) 0.5% IN NEBU: 2.5000 mg | INHALATION_SOLUTION | RESPIRATORY_TRACT | Status: DC | PRN

## 2012-09-09 MED ORDER — PIPERACILLIN-TAZOBACTAM 3.375 G IVPB 30 MIN
3.3750 g | Freq: Once | INTRAVENOUS | Status: AC
  Administered 2012-09-09: 3.375 g via INTRAVENOUS
  Filled 2012-09-09: qty 50

## 2012-09-09 MED ORDER — MAGNESIUM SULFATE 40 MG/ML IJ SOLN
2.0000 g | Freq: Once | INTRAMUSCULAR | Status: AC
  Administered 2012-09-09: 2 g via INTRAVENOUS
  Filled 2012-09-09: qty 50

## 2012-09-09 MED ORDER — IPRATROPIUM BROMIDE 0.02 % IN SOLN: 0.5000 mg | RESPIRATORY_TRACT | Status: DC | PRN

## 2012-09-09 MED ORDER — VANCOMYCIN HCL IN DEXTROSE 1-5 GM/200ML-% IV SOLN
1000.0000 mg | Freq: Two times a day (BID) | INTRAVENOUS | Status: DC
  Administered 2012-09-09 – 2012-09-12 (×7): 1000 mg via INTRAVENOUS
  Filled 2012-09-09 (×7): qty 200

## 2012-09-09 MED ORDER — FILGRASTIM 480 MCG/1.6ML IJ SOLN
480.0000 ug | Freq: Once | INTRAMUSCULAR | Status: AC
  Administered 2012-09-09: 480 ug via SUBCUTANEOUS
  Filled 2012-09-09: qty 1.6

## 2012-09-09 MED ORDER — PIPERACILLIN-TAZOBACTAM 3.375 G IVPB
3.3750 g | Freq: Three times a day (TID) | INTRAVENOUS | Status: DC
  Administered 2012-09-09 – 2012-09-14 (×17): 3.375 g via INTRAVENOUS
  Filled 2012-09-09 (×18): qty 50

## 2012-09-09 MED ORDER — POTASSIUM CHLORIDE CRYS ER 20 MEQ PO TBCR
40.0000 meq | EXTENDED_RELEASE_TABLET | ORAL | Status: AC
  Administered 2012-09-09 (×3): 40 meq via ORAL
  Filled 2012-09-09 (×4): qty 2

## 2012-09-09 MED ORDER — ENSURE COMPLETE PO LIQD
237.0000 mL | Freq: Two times a day (BID) | ORAL | Status: DC
  Administered 2012-09-10 – 2012-09-13 (×7): 237 mL via ORAL

## 2012-09-09 MED ORDER — SODIUM CHLORIDE 0.9 % IJ SOLN
10.0000 mL | INTRAMUSCULAR | Status: DC | PRN
  Administered 2012-09-09 – 2012-09-17 (×6): 10 mL

## 2012-09-09 NOTE — Evaluation (Signed)
Physical Therapy Evaluation Patient Details Name: Veronica Duarte MRN: [id number] DOB: [birth year] Today's Date: 09/09/2012 Time: 4098-1191 PT Time Calculation (min): 28 min  PT Assessment / Plan / Recommendation Clinical Impression  66 yo female admitted with fall, AMS, UTI. Has hx of met lung ca, COPD, dementia, DVT, and bil PE. Pt lives with daughter. Reports she mas modified independent with gait and transfers. Pt was able to ambulate~15 feet with RW. Fatigues easily and was short of breath the entire session-required long breaks for recovery. O2 sats > or = 92% on RA during session.Recommend HHPT and 24 hour supervision.     PT Assessment  Patient needs continued PT services    Follow Up Recommendations  Home health PT;Supervision/Assistance - 24 hour    Does the patient have the potential to tolerate intense rehabilitation      Barriers to Discharge        Equipment Recommendations  None recommended by PT    Recommendations for Other Services OT consult   Frequency Min 3X/week    Precautions / Restrictions Precautions Precautions: Fall Restrictions Weight Bearing Restrictions: No   Pertinent Vitals/Pain R hip area-unrated. Pt was able to weightbear and ambulate short distance.       Mobility  Bed Mobility Bed Mobility: Not assessed Details for Bed Mobility Assistance: Pt sitting on BSC at start of session.  Transfers Transfers: Sit to Stand;Stand to [employer] Transfers Sit to Stand: 4: Min assist Stand to Sit: 4: Min assist Stand Pivot Transfers: 4: Min assist Details for Transfer Assistance: VCS safety, technique, hand placement. Assist to rise, stabilize, control descent.  Ambulation/Gait Ambulation/Gait Assistance: 4: Min assist Ambulation Distance (Feet): 15 Feet Assistive device: Rolling walker Ambulation/Gait Assistance Details: Slow gait speed. Pt reports pain in R hip area, but she is able to weightbear. Dyspnea 3/4.  Gait Pattern: Decreased  stride length;Decreased step length - right;Decreased step length - left;Trunk flexed    Shoulder Instructions     Exercises     PT Diagnosis: Difficulty walking;Generalized weakness  PT Problem List: Decreased strength;Decreased activity tolerance;Decreased balance;Decreased mobility PT Treatment Interventions: DME instruction;Gait training;Stair training;Functional mobility training;Therapeutic activities;Therapeutic exercise;Patient/family education   PT Goals Acute Rehab PT Goals PT Goal Formulation: With patient Time For Goal Achievement: 09/23/12 Potential to Achieve Goals: Good Pt will go Supine/Side to Sit: with modified independence PT Goal: Supine/Side to Sit - Progress: Goal set today Pt will go Sit to Supine/Side: with modified independence PT Goal: Sit to Supine/Side - Progress: Goal set today Pt will go Sit to Stand: with modified independence PT Goal: Sit to Stand - Progress: Goal set today Pt will Ambulate: 51 - 150 feet;with supervision;with least restrictive assistive device PT Goal: Ambulate - Progress: Goal set today Pt will Go Up / Down Stairs: Flight;with supervision;with rail(s) PT Goal: Up/Down Stairs - Progress: Goal set today  Visit Information  Last PT Received On: 09/09/12 Assistance Needed: +1 PT/OT Co-Evaluation/Treatment: Yes    Subjective Data  Subjective: "I don't think I can manage by myself" Patient Stated Goal: Home   Prior Functioning  Home Living Lives With: Daughter Available Help at Discharge: Family Type of Home: House Home Access: Stairs to enter Secretary/administrator of Steps: 1 Home Layout: Two level Alternate Level Stairs-Number of Steps: 3 flijghts Alternate Level Stairs-Rails: Left Bathroom Shower/Tub: Health visitor: Standard Home Adaptive Equipment: Walker - rolling Prior Function Level of Independence: Needs assistance Needs Assistance: Meal Prep;Light Housekeeping Meal Prep: Total Light  Housekeeping:  Total Able to Take Stairs?: Yes Driving: No Vocation: Retired Musician: No difficulties Dominant Hand: Right    Cognition  Overall Cognitive Status: Appears within functional limits for tasks assessed/performed Arousal/Alertness: Awake/alert Orientation Level: Appears intact for tasks assessed Behavior During Session: [facility] for tasks performed    Extremity/Trunk Assessment Right Lower Extremity Assessment RLE ROM/Strength/Tone: Deficits RLE ROM/Strength/Tone Deficits: At least 4/5 throughout with functional activity Left Lower Extremity Assessment LLE ROM/Strength/Tone: Deficits LLE ROM/Strength/Tone Deficits: At least 4/5 throughout with functional activity   Balance    End of Session PT - End of Session Activity Tolerance: Patient limited by fatigue Patient left: in chair;with call bell/phone within reach  GP     [name] [facility] 09/09/2012, 11:45 AM [phone]

## 2012-09-09 NOTE — Progress Notes (Signed)
INITIAL ADULT NUTRITION ASSESSMENT Date: 09/09/2012   Time: 4:05 PM Reason for Assessment: Nutrition risk   INTERVENTION: Ensure Complete BID. Encouraged increased meal intake. Recommend MD consider appetite stimulant as pt with prolonged poor appetite. Recommend MD add Florastor to help with diarrhea. Will monitor.   Pt meets criteria for severe malnutrition of chronic illness AEB <75% estimated energy intake for the past several months and 13.3% weight loss in the past 5 months.   ASSESSMENT: Female 66 y.o.  Dx: Fall  Food/Nutrition Related Hx: Pt with metastatic lung CA s/p chemotherapy. Past records show pt's weight is down 27 pounds since May of this year. Pt states she has not had an appetite since then but tries to eat 2 meals/day. Pt states she recently moved in with her daughter who makes her a protein shake every night. Pt with loose BM for the past 2-3 days PTA. Pt negative for C. Difficile. Pt admitted with a fall at home.   Hx:  Past Medical History  Diagnosis Date  . Diabetes mellitus     type 2- metformin  . Depression   . RAS (renal artery stenosis)   . AAA (abdominal aortic aneurysm)   . Osteoarthritis   . COPD (chronic obstructive pulmonary disease)   . Hx of colonic polyps   . CAD (coronary artery disease)   . OSA (obstructive sleep apnea)     Uses CPAP pressure is 6 study was done > 5 years  . Hypertension     Sees Dr. Tresa Endo with [facility]  . ADENOCARCINOMA, RIGHT LUNG, UPPER LOBE 02/04/2011   Related Meds: Scheduled Meds:   . sodium chloride  1,000 mL Intravenous Once  . sodium chloride   Intravenous STAT  . albuterol  5 mg Nebulization Once  . atorvastatin  20 mg Oral q1800  . budesonide-formoterol  2 puff Inhalation BID  . buPROPion  150 mg Oral Daily  . cefTRIAXone (ROCEPHIN) IVPB 1 gram/50 mL D5W  1 g Intravenous Once  . diltiazem  120 mg Oral Daily  . DULoxetine  60 mg Oral Daily  . ezetimibe  10 mg Oral Daily  . ferrous sulfate  325 mg Oral Q  breakfast  . filgrastim (NEUPOGEN)  SQ  480 mcg Subcutaneous ONCE-1800  . folic acid  1 mg Oral Daily  . fondaparinux  7.5 mg Subcutaneous QHS  . insulin aspart  0-9 Units Subcutaneous TID WC  . ipratropium  0.5 mg Nebulization Once  . magnesium oxide  400 mg Oral QHS  . magnesium oxide  800 mg Oral Daily  . magnesium sulfate 1 - 4 g bolus IVPB  2 g Intravenous Once  . memantine  10 mg Oral Daily  . nebivolol  20 mg Oral Daily  . ondansetron (ZOFRAN) IV  4 mg Intravenous Once  . piperacillin-tazobactam  3.375 g Intravenous Once  . piperacillin-tazobactam (ZOSYN)  IV  3.375 g Intravenous Q8H  . potassium chloride  40 mEq Oral Q4H  . tiotropium  18 mcg Inhalation Daily  . vancomycin  1,000 mg Intravenous Q12H  . DISCONTD: Aclidinium Bromide  1 puff Inhalation BID  . DISCONTD: dexamethasone  4 mg Oral See admin instructions   Continuous Infusions:   . sodium chloride 1,000 mL (09/08/12 1752)  . sodium chloride 50 mL/hr at 09/08/12 2357   PRN Meds:.acetaminophen, acetaminophen, acetaminophen, albuterol, albuterol, ipratropium, ondansetron (ZOFRAN) IV, ondansetron, sodium chloride  Ht: 5\' 5"  (165.1 cm)  Wt: 176 lb 5.9 oz (80 kg)  Ideal  Wt: 125 lb % Ideal Wt: 140  Usual Wt: 206 lb per pt report % Usual Wt: 85  Body mass index is 29.35 kg/(m^2).   Labs:  CMP     Component Value Date/Time   NA 130* 09/09/2012 0514   NA 141 09/03/2012 0918   NA 145 09/06/2011 0902   K 2.8* 09/09/2012 0514   K 3.8 09/03/2012 0918   K 3.9 09/06/2011 0902   CL 96 09/09/2012 0514   CL 106 09/03/2012 0918   CL 100 09/06/2011 0902   CO2 24 09/09/2012 0514   CO2 19* 09/03/2012 0918   CO2 26 09/06/2011 0902   GLUCOSE 90 09/09/2012 0514   GLUCOSE 133* 09/03/2012 0918   GLUCOSE 181* 09/06/2011 0902   BUN 11 09/09/2012 0514   BUN 11.0 09/03/2012 0918   BUN 12 09/06/2011 0902   CREATININE 0.72 09/09/2012 0514   CREATININE 0.8 09/03/2012 0918   CREATININE 0.5* 09/06/2011 0902   CALCIUM 8.6  09/09/2012 0514   CALCIUM 9.8 09/03/2012 0918   CALCIUM 9.5 09/06/2011 0902   PROT 6.0 09/08/2012 1645   PROT 6.1* 09/03/2012 0918   PROT 6.9 09/06/2011 0902   ALBUMIN 2.9* 09/08/2012 1645   ALBUMIN 3.4* 09/03/2012 0918   AST 22 09/08/2012 1645   AST 20 09/03/2012 0918   AST 24 09/06/2011 0902   ALT 13 09/08/2012 1645   ALT 19 09/03/2012 0918   ALKPHOS 51 09/08/2012 1645   ALKPHOS 66 09/03/2012 0918   ALKPHOS 55 09/06/2011 0902   BILITOT 0.6 09/08/2012 1645   BILITOT 0.40 09/03/2012 0918   BILITOT 0.70 09/06/2011 0902   GFRNONAA 86* 09/09/2012 0514   GFRAA >90 09/09/2012 0514    Intake/Output Summary (Last 24 hours) at 09/09/12 1614 Last data filed at 09/09/12 0700  Gross per 24 hour  Intake  402.5 ml  Output    350 ml  Net   52.5 ml   Last BM - 10/22, diarrhea  Diet Order: Cardiac   IVF:    sodium chloride Last Rate: 1,000 mL (09/08/12 1752)  sodium chloride Last Rate: 50 mL/hr at 09/08/12 2357    Estimated Nutritional Needs:   Kcal:1700-1950 Protein:95-115g Fluid:1.7-1.9L  NUTRITION DIAGNOSIS: -Inadequate oral intake (NI-2.1).  Status: Ongoing  RELATED TO: poor appetite  AS EVIDENCE BY: pt statement, unintended weight loss  MONITORING/EVALUATION(Goals): Pt to consume >90% of meals/supplements  EDUCATION NEEDS: -Education needs addressed - briefly discussed ways to increase appetite such as consuming small frequent high calorie/protein snacks/meals.    Dietitian #: [phone]  DOCUMENTATION CODES Per approved criteria  -Severe malnutrition in the context of chronic illness    Marshall Cork 09/09/2012, 4:05 PM

## 2012-09-09 NOTE — Progress Notes (Signed)
CRITICAL VALUE ALERT  Critical value received:  WBC 1.0  Date of notification:  09/09/12  Time of notification:  0643  Critical value read back:yes  Nurse who received alert:  Lucillie Garfinkel  MD notified (1st page):  TRH  Time of first page:  0645  MD notified (2nd page):  Time of second page:  Responding MD:  Donnamarie Poag  Time MD responded:  [phone]

## 2012-09-09 NOTE — Evaluation (Signed)
Occupational Therapy Evaluation Patient Details Name: Veronica Duarte MRN: [id number] DOB: Apr 10, 1946 Today's Date: 09/09/2012 Time: 4098-1191 OT Time Calculation (min): 30 min  OT Assessment / Plan / Recommendation Clinical Impression  66 yo female admitted with fall, AMS, UTI. Has hx of met lung ca, COPD, dementia, DVT, and bil PE. Pt lives with daughter. Pt SOB throughout entire session. Skilled OT recommended to maximize independence with BADLs to supervision level in prep for safe d/c home with HHOT.    OT Assessment  Patient needs continued OT Services    Follow Up Recommendations  Home health OT;Supervision/Assistance - 24 hour    Barriers to Discharge      Equipment Recommendations  3 in 1 bedside comode    Recommendations for Other Services    Frequency  Min 2X/week    Precautions / Restrictions Precautions Precautions: Fall Restrictions Weight Bearing Restrictions: No   Pertinent Vitals/Pain Denied pain. O2 sats remained in low to mid 90s with and without [place].    ADL  Grooming: Simulated;Set up Where Assessed - Grooming: Unsupported sitting Upper Body Bathing: Simulated;Minimal assistance Where Assessed - Upper Body Bathing: Unsupported sitting Lower Body Bathing: Simulated;Minimal assistance Where Assessed - Lower Body Bathing: Supported sit to stand Upper Body Dressing: Simulated;Set up Where Assessed - Upper Body Dressing: Unsupported sitting Lower Body Dressing: Simulated;Moderate assistance Where Assessed - Lower Body Dressing: Supported sit to Pharmacist, hospital: Performed;Minimal Dentist Method: Sit to Barista: Bedside commode Toileting - Clothing Manipulation and Hygiene: Performed;Minimal assistance (for thoroughness with hygeine.) Where Assessed - Toileting Clothing Manipulation and Hygiene: Sit to stand from 3-in-1 or toilet Equipment Used: Rolling walker Transfers/Ambulation Related to ADLs: Pt  ambulated ~15 feet on RA. O2 sats stable but 3/4 DOE. ADL Comments: Pt fatigues VERY quickly. Max time and effort needed for all functional tasks, requiring several extended RBs throughout session.     OT Diagnosis: Generalized weakness  OT Problem List: Decreased strength;Decreased safety awareness;Decreased activity tolerance;Decreased knowledge of use of DME or AE OT Treatment Interventions: Self-care/ADL training;Therapeutic activities;Patient/family education;DME and/or AE instruction   OT Goals Acute Rehab OT Goals OT Goal Formulation: With patient Time For Goal Achievement: 09/23/12 Potential to Achieve Goals: Good ADL Goals Pt Will Perform Grooming: with supervision;Standing at sink;Sitting at sink ADL Goal: Grooming - Progress: Goal set today Pt Will Perform Lower Body Bathing: with supervision;Sit to stand from bed;Sit to stand from chair ADL Goal: Lower Body Bathing - Progress: Goal set today Pt Will Perform Lower Body Dressing: with supervision;Sit to stand from bed;Sit to stand from chair ADL Goal: Lower Body Dressing - Progress: Goal set today Pt Will Transfer to Toilet: with supervision;3-in-1;Comfort height toilet;Regular height toilet;Ambulation ADL Goal: Toilet Transfer - Progress: Goal set today Pt Will Perform Toileting - Clothing Manipulation: with supervision;Sitting on 3-in-1 or toilet;Standing ADL Goal: Toileting - Clothing Manipulation - Progress: Goal set today Pt Will Perform Toileting - Hygiene: with supervision;Sit to stand from 3-in-1/toilet ADL Goal: Toileting - Hygiene - Progress: Goal set today Miscellaneous OT Goals Miscellaneous OT Goal #1: Pt will verbalize and incorporate at least 3 energy conservation strategies into selfcare routine without prompting. OT Goal: Miscellaneous Goal #1 - Progress: Goal set today  Visit Information  Last OT Received On: 09/09/12 Assistance Needed: +1 PT/OT Co-Evaluation/Treatment: Yes    Subjective Data   Subjective: I'm always SOB like this. Patient Stated Goal: Return home with A from daughter.   Prior Functioning  Vision/Perception     Cognition  Overall Cognitive Status: Appears within functional limits for tasks assessed/performed Arousal/Alertness: Awake/alert Orientation Level: Appears intact for tasks assessed Behavior During Session: [facility] for tasks performed    Extremity/Trunk Assessment Right Upper Extremity Assessment RUE ROM/Strength/Tone: Deficits RUE ROM/Strength/Tone Deficits: 4/5 at best per functional observation. Pt also presents with a significant tremor even at rest. Left Upper Extremity Assessment LUE ROM/Strength/Tone: Deficits LUE ROM/Strength/Tone Deficits: 4/5 at best per functional observation. Pt also presents with a significant tremor even at rest. Right Lower Extremity Assessment RLE ROM/Strength/Tone: Deficits RLE ROM/Strength/Tone Deficits: At least 4/5 throughout with functional activity Left Lower Extremity Assessment LLE ROM/Strength/Tone: Deficits LLE ROM/Strength/Tone Deficits: At least 4/5 throughout with functional activity     Mobility Bed Mobility Bed Mobility: Not assessed Details for Bed Mobility Assistance: Pt sitting on BSC at start of session.  Transfers Sit to Stand: 4: Min assist Stand to Sit: 4: Min assist Details for Transfer Assistance: VCS safety, technique, hand placement. Assist to rise, stabilize, control descent.      Shoulder Instructions     Exercise     Balance     End of Session OT - End of Session Activity Tolerance: Patient limited by fatigue Patient left: in chair;with call bell/phone within reach Nurse Communication: Mobility status  GO     Bueford Arp A OTR/L [phone] 09/09/2012, 2:38 PM

## 2012-09-09 NOTE — Progress Notes (Signed)
ANTIBIOTIC CONSULT NOTE - FOLLOW UP  Pharmacy Consult for Vancomycin, Zosyn Indication: rule out pneumonia  No Known Allergies  Patient Measurements: Height: 5\' 5"  (165.1 cm) Weight: 176 lb 5.9 oz (80 kg) IBW/kg (Calculated) : 57   Vital Signs: Temp: 97.2 F (36.2 C) (10/23 1416) Temp src: Oral (10/23 1416) BP: 103/53 mmHg (10/23 1416) Pulse Rate: 66  (10/23 1416)  Labs:  Basename 09/09/12 0514 09/08/12 1645  WBC 1.0* 1.3*  HGB 8.8* 9.7*  PLT 165 190  LABCREA -- --  CREATININE 0.72 0.83   Estimated Creatinine Clearance: 70.3 ml/min (by C-G formula based on Cr of 0.72). No results found for this basename: VANCOTROUGH:2,VANCOPEAK:2,VANCORANDOM:2,GENTTROUGH:2,GENTPEAK:2,GENTRANDOM:2,TOBRATROUGH:2,TOBRAPEAK:2,TOBRARND:2,AMIKACINPEAK:2,AMIKACINTROU:2,AMIKACIN:2, in the last 72 hours   Microbiology: Recent Results (from the past 720 hour(s))  CLOSTRIDIUM DIFFICILE BY PCR     Status: Normal   Collection Time   09/09/12  1:15 AM      Component Value Range Status Comment   C difficile by pcr NEGATIVE  NEGATIVE Final     Anti-infectives     Start     Dose/Rate Route Frequency Ordered Stop   09/09/12 0800  piperacillin-tazobactam (ZOSYN) IVPB 3.375 g       3.375 g 12.5 mL/hr over 240 Minutes Intravenous Every 8 hours 09/09/12 0133     09/09/12 0145  piperacillin-tazobactam (ZOSYN) IVPB 3.375 g       3.375 g 100 mL/hr over 30 Minutes Intravenous  Once 09/09/12 0133 09/09/12 0242   09/08/12 1900   cefTRIAXone (ROCEPHIN) 1 g in dextrose 5 % 50 mL IVPB        1 g 100 mL/hr over 30 Minutes Intravenous  Once 09/08/12 1857 09/08/12 2124          Assessment:  44 YOF with h/o metastatic lung cancer s/p chemo (last on 09/03/12), wedge resection, and COPD; admit with suspected UTI and pneumonia.  CrCl ~ 70 ml/min, afebrile, WBC = 1  10/23 Cdiff PCR negative, Urine culture in process  Goal of Therapy:  Vancomycin trough level 15-20 mcg/ml Zosyn per renal  function  Plan:   Continue Zosyn 3.375g IV Q8H infused over 4hrs.  Vancomycin 1g IV q12h.  Measure Vanc trough at steady state.  Follow up renal fxn and culture results.   Lynann Beaver PharmD, BCPS Pager [phone] 09/09/2012 3:03 PM

## 2012-09-09 NOTE — Progress Notes (Signed)
[facility] PROGRESS NOTE  Veronica Duarte ZOX:[id number] DOB: 02/21/1946 DOA: 09/08/2012 PCP: [name], MD  Assessment/Plan:  #1 fall Likely secondary to mechanical fall secondary to dehydration, urinary tract infection, plus or minus pneumonia. CT of the head was negative. Urinalysis consistent with a UTI. Urine cultures are pending. Repeat chest x-ray this morning was concerning for right sided pneumonia. Continue empiric IV Zosyn. Will add vancomycin to patient's current regimen. PT/OT  #2 pneumonia Patient does have a history of metastatic lung cancer status post chemotherapy recently. Patient has a neutropenia with a white count of 1.0.  Patient also presented with confusion. Will treat patient's pneumonia is a healthcare associated pneumonia secondary to immunocompromised state. Continue Zosyn. Will add IV vancomycin. In follow.  #3 urinary tract infection Urine cultures are pending. Continue IV Zosyn  #4 acute encephalopathy Likely secondary to urinary tract infection and probable pneumonia. Slowly improving clinically. Continue IV fluids. Continue empiric IV vancomycin and Zosyn. Follow. If worsens may consider MRI of the head to rule out metastatic disease.  #5 history of bilateral PE and DVT Continue Arixtra  #6 history of metastatic lung cancer Per oncology. Patient's oncologist is aware of patient's admission.  #7 right hip pain Plain films of the right hip are negative. Continue pain management.  #8 diarrhea C. difficile PCR is negative. Follow.  #9 neutropenia/anemia Likely secondary to recent chemotherapy therapy in the setting of urinary tract infection and possible pneumonia. Will check blood cultures x2. Chest x-ray consistent with a pneumonia. Urine cultures are pending. Check an anemia panel. Continue empiric IV Zosyn. Will place on IV vancomycin. Follow. Will also place on neutropenic precautions. Will give a dose of Neupogen. Discussed this with  patient's oncologist.  #10 COPD Stable. Nebs as needed. Oxygen. Continue empiric antibiotics for problem #2.  #11 obstructive sleep apnea CPAP each bedtime.  #12 hypokalemia Replete.  #13 prophylaxis On Arixtra for DVT prophylaxis.      Code Status: full Family Communication: Updated patient. No family at bedside. Disposition Plan: Home when medically stable   Consultants:  None  Procedures:  CT head 09/09/2012  Chest x-ray 09/09/2012  Plain films of the right hip 09/09/2012  Antibiotics: IV Zosyn 09/08/2012 IV vancomycin 09/09/2012  HPI/Subjective: Patient alert to self, and she is in the [facility], states he's 2013, states it is November. Patient denies any complaints. Patient states is feeling better. Patient states that she was clumsy when she fell and denies any syncopal episodes.  Objective: Filed Vitals:   09/08/12 2343 09/09/12 0632 09/09/12 0905 09/09/12 1416  BP:  138/67  103/53  Pulse: 79 78  66  Temp:  99.7 F (37.6 C)  97.2 F (36.2 C)  TempSrc:  Oral  Oral  Resp: 17 18  18   Height:      Weight:      SpO2:  99% 97% 97%    Intake/Output Summary (Last 24 hours) at 09/09/12 1455 Last data filed at 09/09/12 0700  Gross per 24 hour  Intake  402.5 ml  Output    350 ml  Net   52.5 ml   Filed Weights   09/08/12 2300  Weight: 80 kg (176 lb 5.9 oz)    Exam:   General:  NAD  Cardiovascular: RRR  Respiratory: CTAB  Abdomen: SOFT/nd/nd/+ bs  Data Reviewed: Basic Metabolic Panel:  Lab 09/09/12 8119 09/08/12 1645 09/03/12 0918  NA 130* 132* 141  K 2.8* 3.7 3.8  CL 96 95* 106  CO2  24 26 19*  GLUCOSE 90 108* 133*  BUN 11 18 11.0  CREATININE 0.72 0.83 0.8  CALCIUM 8.6 9.1 9.8  MG 1.5 -- 1.8  PHOS -- -- --   Liver Function Tests:  Lab 09/08/12 1645 09/03/12 0918  AST 22 20  ALT 13 19  ALKPHOS 51 66  BILITOT 0.6 0.40  PROT 6.0 6.1*  ALBUMIN 2.9* 3.4*   No results found for this basename: LIPASE:5,AMYLASE:5 in the  last 168 hours No results found for this basename: AMMONIA:5 in the last 168 hours CBC:  Lab 09/09/12 0514 09/08/12 1645 09/03/12 0917  WBC 1.0* 1.3* 6.5  NEUTROABS -- 0.8* 5.3  HGB 8.8* 9.7* 10.6*  HCT 25.7* 28.3* 32.7*  MCV 92.8 92.8 95.6  PLT 165 190 229   Cardiac Enzymes: No results found for this basename: CKTOTAL:5,CKMB:5,CKMBINDEX:5,TROPONINI:5 in the last 168 hours BNP (last 3 results) No results found for this basename: PROBNP:3 in the last 8760 hours CBG:  Lab 09/09/12 1204 09/09/12 0922 09/08/12 2309 09/08/12 1549  GLUCAP 95 96 95 107*    Recent Results (from the past 240 hour(s))  CLOSTRIDIUM DIFFICILE BY PCR     Status: Normal   Collection Time   09/09/12  1:15 AM      Component Value Range Status Comment   C difficile by pcr NEGATIVE  NEGATIVE Final      Studies: Dg Chest 2 View  09/09/2012  *RADIOLOGY REPORT*  Clinical Data: Shortness of breath.  Worsening cough.  Right hip pain.  History diabetes.  COPD.  Ex-smoker.  CHEST - 2 VIEW  Comparison: Plain film of 1 day prior.  CT of 08/10/2012.  Findings: Lateral view degraded by patient arm position.  Prior median sternotomy.  Hyperinflation.  Left-sided Port-A-Cath which terminates at the mid SVC. Normal heart size.  Right hilar soft tissue fullness, likely corresponding adenopathy on recent CT. No pleural effusion or pneumothorax. Patchy right base air space disease is not significantly changed.  IMPRESSION: Patchy right base air space disease.  Suspicious for infection, especially given the appearance on 08/10/2012 CT. Recommend radiographic follow-up until clearing.  Right hilar adenopathy, as detailed on 08/10/2012 CT.  COPD/emphysema.   Original Report Authenticated By: Consuello Bossier, M.D.    Dg Hip Complete Right  09/09/2012  *RADIOLOGY REPORT*  Clinical Data: Hip pain for 2 days.  History diabetes.  COPD.  RIGHT HIP - COMPLETE 2+ VIEW  Comparison: CT of 08/10/2012  Findings: Femoral heads are located.  Mild  osteopenia.  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 No acute fracture.  Surgical clips in the right inguinal region.  IMPRESSION: Degenerative change, without acute osseous finding.   Original Report Authenticated By: Consuello Bossier, M.D.    Ct Head Wo Contrast  09/09/2012  *RADIOLOGY REPORT*  Clinical Data: Lung ca w/ liver mets; chemo ongoing; found on floor 09/08/12; increased confusion; hx aaa; renal artery stenosis; sickle cell anemia; dvt; unable to walk.  CT HEAD WITHOUT CONTRAST  Technique:  Contiguous axial images were obtained from the base of the skull through the vertex without contrast.  Comparison: 03/06/2012  Findings: Suspected small remote left inferior cerebellar lacunar infarct, image 7 of series 2. Periventricular and corona radiata white matter hypodensities are most compatible with chronic ischemic microvascular white matter disease.  Otherwise, the brain stem, cerebellum, cerebral peduncles, thalami, basal ganglia, basilar cisterns, and ventricular system appear unremarkable.  No intracranial hemorrhage, mass lesion, or  acute infarction is identified.  The visualized paranasal sinuses appear clear.  Atherosclerotic calcification of the carotid siphons noted.  IMPRESSION:  1. Periventricular and corona radiata white matter hypodensities are most compatible with chronic ischemic microvascular white matter disease. 2.  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 3.  No noncontrast findings of intracranial metastatic disease. Please note that MRI with and without contrast offers greater diagnostic sensitivity and specificity for small intracranial metastatic lesions.   Original Report Authenticated By: Dellia Cloud, M.D.    Dg Chest Port 1 View  09/08/2012  *RADIOLOGY REPORT*  Clinical Data: Altered mental status  PORTABLE CHEST - 1 VIEW  Comparison: 09/01/2012  Findings: Cardiomediastinal  silhouette is stable.  Status post median sternotomy.  Stable left Port-A-Cath position.  Stable nodule in the right upper lobe. No acute infiltrate or pulmonary edema.  IMPRESSION: Status post median sternotomy. No active disease.  Stable nodule in the right upper lobe.   Original Report Authenticated By: Natasha Mead, M.D.     Scheduled Meds:   . sodium chloride  1,000 mL Intravenous Once  . sodium chloride   Intravenous STAT  . albuterol  5 mg Nebulization Once  . atorvastatin  20 mg Oral q1800  . budesonide-formoterol  2 puff Inhalation BID  . buPROPion  150 mg Oral Daily  . cefTRIAXone (ROCEPHIN) IVPB 1 gram/50 mL D5W  1 g Intravenous Once  . diltiazem  120 mg Oral Daily  . DULoxetine  60 mg Oral Daily  . ezetimibe  10 mg Oral Daily  . ferrous sulfate  325 mg Oral Q breakfast  . filgrastim (NEUPOGEN)  SQ  480 mcg Subcutaneous ONCE-1800  . folic acid  1 mg Oral Daily  . fondaparinux  7.5 mg Subcutaneous QHS  . insulin aspart  0-9 Units Subcutaneous TID WC  . ipratropium  0.5 mg Nebulization Once  . magnesium oxide  400 mg Oral QHS  . magnesium oxide  800 mg Oral Daily  . magnesium sulfate 1 - 4 g bolus IVPB  2 g Intravenous Once  . memantine  10 mg Oral Daily  . nebivolol  20 mg Oral Daily  . ondansetron (ZOFRAN) IV  4 mg Intravenous Once  . piperacillin-tazobactam  3.375 g Intravenous Once  . piperacillin-tazobactam (ZOSYN)  IV  3.375 g Intravenous Q8H  . potassium chloride  40 mEq Oral Q4H  . tiotropium  18 mcg Inhalation Daily  . DISCONTD: Aclidinium Bromide  1 puff Inhalation BID  . DISCONTD: dexamethasone  4 mg Oral See admin instructions   Continuous Infusions:   . sodium chloride 1,000 mL (09/08/12 1752)  . sodium chloride 50 mL/hr at 09/08/12 2357    Principal Problem:  *Fall Active Problems:  Type II or unspecified type diabetes mellitus with renal manifestations, uncontrolled(250.42)  HYPERLIPIDEMIA  DEMENTIA  DEPRESSION  HYPERTENSION  COPD  ADENOCARCINOMA,  RIGHT LUNG, UPPER LOBE  CAD (coronary artery disease)  PE (pulmonary embolism)  UTI (lower urinary tract infection)  Neutropenia  Encephalopathy  Dehydration  PNA (pneumonia)  Hypokalemia  Anemia    Time spent: > 35 mins    [facility]  [facility] Pager [phone]. If 8PM-8AM, please contact night-coverage at [email or web address], password [facility] 09/09/2012, 2:55 PM  LOS: 1 day

## 2012-09-09 NOTE — Progress Notes (Signed)
ANTIBIOTIC CONSULT NOTE - INITIAL  Pharmacy Consult for zosyn Indication: Suspected infection UTI  No Known Allergies  Patient Measurements: Height: 5\' 5"  (165.1 cm) Weight: 176 lb 5.9 oz (80 kg) IBW/kg (Calculated) : 57  Adjusted Body Weight:   Vital Signs: Temp: 98.9 F (37.2 C) (10/22 2338) Temp src: Oral (10/22 2338) BP: 131/72 mmHg (10/22 2338) Pulse Rate: 79  (10/22 2343) Intake/Output from previous day:   Intake/Output from this shift:    Labs:  Basename 09/08/12 1645  WBC 1.3*  HGB 9.7*  PLT 190  LABCREA --  CREATININE 0.83   Estimated Creatinine Clearance: 67.8 ml/min (by C-G formula based on Cr of 0.83). No results found for this basename: VANCOTROUGH:2,VANCOPEAK:2,VANCORANDOM:2,GENTTROUGH:2,GENTPEAK:2,GENTRANDOM:2,TOBRATROUGH:2,TOBRAPEAK:2,TOBRARND:2,AMIKACINPEAK:2,AMIKACINTROU:2,AMIKACIN:2, in the last 72 hours   Microbiology: No results found for this or any previous visit (from the past 720 hour(s)).  Medical History: Past Medical History  Diagnosis Date  . Diabetes mellitus     type 2- metformin  . ADENOCARCINOMA, RIGHT LUNG, UPPER LOBE 02/04/2011  . Depression   . RAS (renal artery stenosis)   . AAA (abdominal aortic aneurysm)   . Osteoarthritis   . COPD (chronic obstructive pulmonary disease)   . Hx of colonic polyps   . CAD (coronary artery disease)   . OSA (obstructive sleep apnea)     Uses CPAP pressure is 6 study was done > 5 years  . Hypertension     Sees Dr. Tresa Endo with [facility]    Medications:  Anti-infectives     Start     Dose/Rate Route Frequency Ordered Stop   09/09/12 0800   piperacillin-tazobactam (ZOSYN) IVPB 3.375 g        3.375 g 12.5 mL/hr over 240 Minutes Intravenous Every 8 hours 09/09/12 0133     09/09/12 0145   piperacillin-tazobactam (ZOSYN) IVPB 3.375 g        3.375 g 100 mL/hr over 30 Minutes Intravenous  Once 09/09/12 0133     09/08/12 1900   cefTRIAXone (ROCEPHIN) 1 g in dextrose 5 % 50 mL IVPB        1 g 100  mL/hr over 30 Minutes Intravenous  Once 09/08/12 1857 09/08/12 2124         Assessment: Patient with UTI.    Goal of Therapy:  Zosyn based on renal function   Plan:  Zosyn 3.375g IV Q8H infused over 4hrs.   Darlina Guys, Jacquenette Shone Crowford 09/09/2012,1:35 AM

## 2012-09-10 LAB — VITAMIN B12: Vitamin B-12: 912 pg/mL — ABNORMAL HIGH (ref 211–911)

## 2012-09-10 LAB — CBC WITH DIFFERENTIAL/PLATELET
Basophils Relative: 0 % (ref 0–1)
Eosinophils Absolute: 0 10*3/uL (ref 0.0–0.7)
Eosinophils Relative: 1 % (ref 0–5)
Hemoglobin: 8.6 g/dL — ABNORMAL LOW (ref 12.0–15.0)
Lymphs Abs: 0.4 10*3/uL — ABNORMAL LOW (ref 0.7–4.0)
MCH: 31 pg (ref 26.0–34.0)
MCHC: 33.6 g/dL (ref 30.0–36.0)
MCV: 92.4 fL (ref 78.0–100.0)
Monocytes Absolute: 0 10*3/uL — ABNORMAL LOW (ref 0.1–1.0)
Neutro Abs: 1.1 10*3/uL — ABNORMAL LOW (ref 1.7–7.7)
Neutrophils Relative %: 69 % (ref 43–77)
RBC: 2.77 MIL/uL — ABNORMAL LOW (ref 3.87–5.11)

## 2012-09-10 LAB — GLUCOSE, CAPILLARY
Glucose-Capillary: 92 mg/dL (ref 70–99)
Glucose-Capillary: 98 mg/dL (ref 70–99)
Glucose-Capillary: 86 mg/dL (ref 70–99)

## 2012-09-10 LAB — BASIC METABOLIC PANEL
BUN: 8 mg/dL (ref 6–23)
CO2: 21 mEq/L (ref 19–32)
Calcium: 8.8 mg/dL (ref 8.4–10.5)
Creatinine, Ser: 0.76 mg/dL (ref 0.50–1.10)
GFR calc non Af Amer: 85 mL/min — ABNORMAL LOW (ref >90)
Glucose, Bld: 102 mg/dL — ABNORMAL HIGH (ref 70–99)
Sodium: 133 mEq/L — ABNORMAL LOW (ref 135–145)

## 2012-09-10 LAB — URINE CULTURE: Colony Count: 35000

## 2012-09-10 LAB — IRON AND TIBC
Saturation Ratios: 42 % (ref 20–55)
TIBC: 178 ug/dL — ABNORMAL LOW (ref 250–470)
UIBC: 103 ug/dL — ABNORMAL LOW (ref 125–400)

## 2012-09-10 LAB — FERRITIN: Ferritin: 2290 ng/mL — ABNORMAL HIGH (ref 10–291)

## 2012-09-10 MED ORDER — MAGNESIUM SULFATE 40 MG/ML IJ SOLN
2.0000 g | Freq: Once | INTRAMUSCULAR | Status: AC
  Administered 2012-09-10: 2 g via INTRAVENOUS
  Filled 2012-09-10: qty 50

## 2012-09-10 MED ORDER — LOPERAMIDE HCL 2 MG PO CAPS
2.0000 mg | ORAL_CAPSULE | ORAL | Status: DC | PRN
  Administered 2012-09-11: 2 mg via ORAL
  Filled 2012-09-10: qty 1

## 2012-09-10 NOTE — Progress Notes (Signed)
Physical Therapy Treatment Patient Details Name: Veronica Duarte MRN: [id number] DOB: November 09, 1946 Today's Date: 09/10/2012 Time: 0930-1006 PT Time Calculation (min): 36 min  PT Assessment / Plan / Recommendation Comments on Treatment Session  Pt continues to demonstrate general weakness and limited activity tolerance. Discussed need for 24 hour supervision if pt were to d/c home-pt unsure if this is possible. Encouraged pt to have conversation with her daughter. IF 24 hour not available, may need to consider SNF    Follow Up Recommendations  Home health PT;Supervision/Assistance - 24 hour (may need to consider SNF if 24 hour not available)     Does the patient have the potential to tolerate intense rehabilitation     Barriers to Discharge        Equipment Recommendations  3 in 1 bedside comode    Recommendations for Other Services    Frequency Min 3X/week   Plan Discharge plan remains appropriate    Precautions / Restrictions Precautions Precautions: Fall Restrictions Weight Bearing Restrictions: No   Pertinent Vitals/Pain Back 8/10    Mobility  Bed Mobility Bed Mobility: Supine to Sit Supine to Sit: 4: Min assist;HOB elevated Details for Bed Mobility Assistance: Increased time. Pt fatigued after bed mobility and required rest break before proceeding. VCs safety, technique. hand placement.  Transfers Transfers: Sit to Stand;Stand to Sit Sit to Stand: 4: Min assist;From bed;With upper extremity assist Stand to Sit: 4: Min assist;To chair/3-in-1;With armrests;With upper extremity assist Details for Transfer Assistance: VCs safety, technique, hand placement. Assist to rise, stabilize, control descent.  Ambulation/Gait Ambulation/Gait Assistance: 4: Min assist Ambulation Distance (Feet): 30 Feet (15'x2) Assistive device: Rolling walker Ambulation/Gait Assistance Details: VCs safety. Assist to stabilize and maneuver with RW. slow gait speed. Fatigues very easily. dyspnea  3-4/4.  Gait Pattern: Decreased stride length;Decreased step length - right;Decreased step length - left;Trunk flexed;Step-through pattern    Exercises     PT Diagnosis:    PT Problem List:   PT Treatment Interventions:     PT Goals Acute Rehab PT Goals Pt will go Supine/Side to Sit: with modified independence PT Goal: Supine/Side to Sit - Progress: Progressing toward goal Pt will go Sit to Stand: with modified independence PT Goal: Sit to Stand - Progress: Progressing toward goal Pt will Ambulate: 51 - 150 feet;with supervision;with least restrictive assistive device PT Goal: Ambulate - Progress: Progressing toward goal  Visit Information  Last PT Received On: 09/10/12 Assistance Needed: +1 PT/OT Co-Evaluation/Treatment: Yes    Subjective Data  Subjective: "My daughter would be mad at me (if pt goes to rehab)" Patient Stated Goal: Home   Cognition  Overall Cognitive Status: Appears within functional limits for tasks assessed/performed Arousal/Alertness: Awake/alert Orientation Level: Appears intact for tasks assessed Behavior During Session: [facility] for tasks performed    Balance     End of Session PT - End of Session Equipment Utilized During Treatment: Gait belt;Oxygen Activity Tolerance: Patient limited by fatigue Patient left: in chair;with call bell/phone within reach;with nursing in room   GP     Veronica Duarte 09/10/2012, 11:58 AM [phone]

## 2012-09-10 NOTE — Progress Notes (Signed)
Occupational Therapy Treatment Patient Details Name: Veronica Duarte MRN: [id number] DOB: 02-12-1946 Today's Date: 09/10/2012 Time: 0930-1004 OT Time Calculation (min): 34 min  OT Assessment / Plan / Recommendation Comments on Treatment Session Pt continues to displays decreased activity tolerance and requires much increased time and rest breaks to complete toileting task. Discussed SNF option but pt refuses. Pt will need 24/7 at home if she refuses SNF. Pt aware and will talk to daughter.     Follow Up Recommendations  Home health OT;Supervision/Assistance - 24 hour    Barriers to Discharge       Equipment Recommendations  3 in 1 bedside comode    Recommendations for Other Services    Frequency Min 2X/week   Plan Discharge plan remains appropriate    Precautions / Restrictions Precautions Precautions: Fall Restrictions Weight Bearing Restrictions: No   Pertinent Vitals/Pain O2 sats on RA after transfer into bathroom 90-91% then briefly down to 87% but back up to 91%. Replaced O2 to transfer back out of bathroom and to chair and O2 at 95% once replaced.    ADL  Toilet Transfer: Performed;Minimal assistance;Other (comment) (much increased time and effort. Pt with 3/4 dyspnea) Toilet Transfer Equipment: Raised toilet seat with arms (or 3-in-1 over toilet) Toileting - Clothing Manipulation and Hygiene: Simulated;Minimal assistance;Other (comment) (min assist with hygiene after BM due to fatigue.) Where Assessed - Toileting Clothing Manipulation and Hygiene: Sit to stand from 3-in-1 or toilet Equipment Used: Rolling walker ADL Comments: Pt requires much increased time for transfer into and out of bathroom for toileting. Pt briefly had to stop at bathroom doorway to prop on RW and rest. Pt with 3/4 dyspnea throughout activity. Encouraged PLB during session but pt tends to mouth breathe. Made recommendation for 24/7 assist as pt refuses SNF. Pt will talk to her daughter.     OT  Diagnosis:    OT Problem List:   OT Treatment Interventions:     OT Goals ADL Goals ADL Goal: Toilet Transfer - Progress: Not progressing (still min assist and much effort. with dyspnea) ADL Goal: Toileting - Clothing Manipulation - Progress: Not progressing ADL Goal: Toileting - Hygiene - Progress: Not progressing Miscellaneous OT Goals OT Goal: Miscellaneous Goal #1 - Progress: Progressing toward goals  Visit Information  Last OT Received On: 09/10/12 Assistance Needed: +1 PT/OT Co-Evaluation/Treatment: Yes    Subjective Data  Subjective: I just dont have an appetite Patient Stated Goal: none stated. agreeable up with OT/PT   Prior Functioning       Cognition  Overall Cognitive Status: Appears within functional limits for tasks assessed/performed Arousal/Alertness: Awake/alert Orientation Level: Appears intact for tasks assessed Behavior During Session: [facility] for tasks performed    Mobility  Shoulder Instructions Bed Mobility Bed Mobility: Supine to Sit Supine to Sit: 4: Min assist;With rails;HOB elevated Details for Bed Mobility Assistance: min assist to help facilitate reaching for rail and starting to bring trunk forward. Cues for hand placement to self assist and increased time. Transfers Transfers: Sit to Stand;Stand to Sit Sit to Stand: 4: Min assist;With upper extremity assist;From bed;From chair/3-in-1 Stand to Sit: 4: Min assist;With upper extremity assist;To chair/3-in-1 Details for Transfer Assistance: verbal cues for hand placement and to back up completely to surface. Pt states she wants to "plop" back but made sure pt practiced controlling descent. Min assist to stabilize with transitions to standing/sitting.       Exercises      Balance     End of Session  OT - End of Session Equipment Utilized During Treatment: Gait belt Activity Tolerance: Patient limited by fatigue Patient left: in chair;with call bell/phone within reach  GO     Lennox Laity [phone] 09/10/2012, 11:33 AM

## 2012-09-10 NOTE — Progress Notes (Signed)
Patient's daughter would like to speak with case management, her number is in the chart.

## 2012-09-10 NOTE — Progress Notes (Signed)
Met with pt at bedside to discuss d/c planning. She stated she lives her daughter and that her daughter is not with her 24/7. I informed her of what PT was recommending i.e home with 24/7 supervision and if none available then SNF. I offered to speak with her daughter about this but pt stated she would do that and me to follow up with her tomorrow.

## 2012-09-10 NOTE — Progress Notes (Signed)
[facility] PROGRESS NOTE  Veronica Duarte ZOX:[id number] DOB: 1946/01/15 DOA: 09/08/2012 PCP: [name], MD  Assessment/Plan:  #1 fall Likely secondary to mechanical fall secondary to dehydration, urinary tract infection, plus or minus pneumonia. CT of the head was negative. Urinalysis consistent with a UTI. Urine cultures are pending. Repeat chest x-ray this morning was concerning for right sided pneumonia. Continue empiric IV Zosyn and vancomycin to patient's current regimen. PT/OT  #2 pneumonia Patient does have a history of metastatic lung cancer status post chemotherapy recently. Patient has a neutropenia with a white count of 1.0.  Patient also presented with confusion. Will treat patient's pneumonia is a healthcare associated pneumonia secondary to immunocompromised state. Continue Zosyn and IV vancomycin.  follow.  #3 urinary tract infection Urine cultures are pending. Continue IV Zosyn  #4 acute encephalopathy Likely secondary to urinary tract infection and probable pneumonia. Slowly improving clinically. Continue IV fluids. Continue empiric IV vancomycin and Zosyn. Follow. If worsens may consider MRI of the head to rule out metastatic disease.  #5 history of bilateral PE and DVT Continue Arixtra  #6 history of metastatic lung cancer Per oncology. Patient's oncologist is aware of patient's admission.  #7 right hip pain Plain films of the right hip are negative. Continue pain management.  #8 diarrhea C. difficile PCR is negative. Imodium PRN. Follow.  #9 neutropenia/anemia Likely secondary to recent chemotherapy therapy in the setting of urinary tract infection and possible pneumonia. Blood cultures pending. Chest x-ray consistent with a pneumonia. Urine cultures are pending. Anemia panel pending. WBC improved after a dose of Neupogen.Continue empiric IV Zosyn. Continue IV vancomycin. Follow. Will also place on neutropenic precautions. Discussed this with patient's  oncologist.  #10 COPD Stable. Nebs as needed. Oxygen. Continue empiric antibiotics for problem #2.  #11 obstructive sleep apnea CPAP each bedtime.  #12 hypokalemia Replete.  #13 prophylaxis On Arixtra for DVT prophylaxis.      Code Status: full Family Communication: Updated patient. No family at bedside. Disposition Plan: Home when medically stable   Consultants:  None  Procedures:  CT head 09/09/2012  Chest x-ray 09/09/2012  Plain films of the right hip 09/09/2012  Antibiotics: IV Zosyn 09/08/2012 IV vancomycin 09/09/2012  HPI/Subjective: Patient alert to self, and she is at her daughter's house, states it is 1989. Patient denies any complaints. Patient states is feeling better.   Objective: Filed Vitals:   09/10/12 0829 09/10/12 0950 09/10/12 1012 09/10/12 1015  BP:   104/67 104/67  Pulse:  79  71  Temp:    97.7 F (36.5 C)  TempSrc:    Oral  Resp:    20  Height:      Weight:      SpO2: 95% 87%      Intake/Output Summary (Last 24 hours) at 09/10/12 1133 Last data filed at 09/10/12 0419  Gross per 24 hour  Intake 5406.25 ml  Output    300 ml  Net 5106.25 ml   Filed Weights   09/08/12 2300  Weight: 80 kg (176 lb 5.9 oz)    Exam:   General:  NAD  Cardiovascular: RRR  Respiratory: CTAB  Abdomen: SOFT/nd/nd/+ bs  Data Reviewed: Basic Metabolic Panel:  Lab 09/10/12 8119 09/09/12 0514 09/08/12 1645  NA 133* 130* 132*  K 3.6 2.8* 3.7  CL 100 96 95*  CO2 21 24 26   GLUCOSE 102* 90 108*  BUN 8 11 18   CREATININE 0.76 0.72 0.83  CALCIUM 8.8 8.6 9.1  MG 1.7 1.5 --  PHOS -- -- --   Liver Function Tests:  Lab 09/08/12 1645  AST 22  ALT 13  ALKPHOS 51  BILITOT 0.6  PROT 6.0  ALBUMIN 2.9*   No results found for this basename: LIPASE:5,AMYLASE:5 in the last 168 hours No results found for this basename: AMMONIA:5 in the last 168 hours CBC:  Lab 09/10/12 0435 09/09/12 0514 09/08/12 1645  WBC 1.5* 1.0* 1.3*  NEUTROABS 1.1* --  0.8*  HGB 8.6* 8.8* 9.7*  HCT 25.6* 25.7* 28.3*  MCV 92.4 92.8 92.8  PLT 134* 165 190   Cardiac Enzymes: No results found for this basename: CKTOTAL:5,CKMB:5,CKMBINDEX:5,TROPONINI:5 in the last 168 hours BNP (last 3 results) No results found for this basename: PROBNP:3 in the last 8760 hours CBG:  Lab 09/10/12 0812 09/09/12 2252 09/09/12 1652 09/09/12 1204 09/09/12 0922  GLUCAP 92 104* 99 95 96    Recent Results (from the past 240 hour(s))  URINE CULTURE     Status: Normal (Preliminary result)   Collection Time   09/08/12  5:15 PM      Component Value Range Status Comment   Specimen Description URINE, CLEAN CATCH   Final    Special Requests NONE   Final    Culture  Setup Time 09/09/2012 03:16   Final    Colony Count 35,000 COLONIES/ML   Final    Culture GRAM NEGATIVE RODS   Final    Report Status PENDING   Incomplete   CLOSTRIDIUM DIFFICILE BY PCR     Status: Normal   Collection Time   09/09/12  1:15 AM      Component Value Range Status Comment   C difficile by pcr NEGATIVE  NEGATIVE Final   CULTURE, BLOOD (ROUTINE X 2)     Status: Normal (Preliminary result)   Collection Time   09/09/12  4:00 PM      Component Value Range Status Comment   Specimen Description BLOOD RIGHT HAND   Final    Special Requests BOTTLES DRAWN AEROBIC AND ANAEROBIC 10CC   Final    Culture  Setup Time 09/09/2012 23:20   Final    Culture     Final    Value:        BLOOD CULTURE RECEIVED NO GROWTH TO DATE CULTURE WILL BE HELD FOR 5 DAYS BEFORE ISSUING A FINAL NEGATIVE REPORT   Report Status PENDING   Incomplete   CULTURE, BLOOD (ROUTINE X 2)     Status: Normal (Preliminary result)   Collection Time   09/09/12  4:20 PM      Component Value Range Status Comment   Specimen Description BLOOD RIGHT ARM   Final    Special Requests BOTTLES DRAWN AEROBIC AND ANAEROBIC 10CC   Final    Culture  Setup Time 09/09/2012 23:20   Final    Culture     Final    Value:        BLOOD CULTURE RECEIVED NO GROWTH TO  DATE CULTURE WILL BE HELD FOR 5 DAYS BEFORE ISSUING A FINAL NEGATIVE REPORT   Report Status PENDING   Incomplete      Studies: Dg Chest 2 View  09/09/2012  *RADIOLOGY REPORT*  Clinical Data: Shortness of breath.  Worsening cough.  Right hip pain.  History diabetes.  COPD.  Ex-smoker.  CHEST - 2 VIEW  Comparison: Plain film of 1 day prior.  CT of 08/10/2012.  Findings: Lateral view degraded by patient arm position.  Prior median sternotomy.  Hyperinflation.  Left-sided  Port-A-Cath which terminates at the mid SVC. Normal heart size.  Right hilar soft tissue fullness, likely corresponding adenopathy on recent CT. No pleural effusion or pneumothorax. Patchy right base air space disease is not significantly changed.  IMPRESSION: Patchy right base air space disease.  Suspicious for infection, especially given the appearance on 08/10/2012 CT. Recommend radiographic follow-up until clearing.  Right hilar adenopathy, as detailed on 08/10/2012 CT.  COPD/emphysema.   Original Report Authenticated By: Consuello Bossier, M.D.    Dg Hip Complete Right  09/09/2012  *RADIOLOGY REPORT*  Clinical Data: Hip pain for 2 days.  History diabetes.  COPD.  RIGHT HIP - COMPLETE 2+ VIEW  Comparison: CT of 08/10/2012  Findings: Femoral heads are located.  Mild osteopenia.  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 No acute fracture.  Surgical clips in the right inguinal region.  IMPRESSION: Degenerative change, without acute osseous finding.   Original Report Authenticated By: Consuello Bossier, M.D.    Ct Head Wo Contrast  09/09/2012  *RADIOLOGY REPORT*  Clinical Data: Lung ca w/ liver mets; chemo ongoing; found on floor 09/08/12; increased confusion; hx aaa; renal artery stenosis; sickle cell anemia; dvt; unable to walk.  CT HEAD WITHOUT CONTRAST  Technique:  Contiguous axial images were obtained from the base of the skull through the vertex without contrast.  Comparison:  03/06/2012  Findings: Suspected small remote left inferior cerebellar lacunar infarct, image 7 of series 2. Periventricular and corona radiata white matter hypodensities are most compatible with chronic ischemic microvascular white matter disease.  Otherwise, the brain stem, cerebellum, cerebral peduncles, thalami, basal ganglia, basilar cisterns, and ventricular system appear unremarkable.  No intracranial hemorrhage, mass lesion, or acute infarction is identified.  The visualized paranasal sinuses appear clear.  Atherosclerotic calcification of the carotid siphons noted.  IMPRESSION:  1. Periventricular and corona radiata white matter hypodensities are most compatible with chronic ischemic microvascular white matter disease. 2.  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 3.  No noncontrast findings of intracranial metastatic disease. Please note that MRI with and without contrast offers greater diagnostic sensitivity and specificity for small intracranial metastatic lesions.   Original Report Authenticated By: Dellia Cloud, M.D.    Dg Chest Port 1 View  09/08/2012  *RADIOLOGY REPORT*  Clinical Data: Altered mental status  PORTABLE CHEST - 1 VIEW  Comparison: 09/01/2012  Findings: Cardiomediastinal silhouette is stable.  Status post median sternotomy.  Stable left Port-A-Cath position.  Stable nodule in the right upper lobe. No acute infiltrate or pulmonary edema.  IMPRESSION: Status post median sternotomy. No active disease.  Stable nodule in the right upper lobe.   Original Report Authenticated By: Natasha Mead, M.D.     Scheduled Meds:    . sodium chloride   Intravenous STAT  . atorvastatin  20 mg Oral q1800  . budesonide-formoterol  2 puff Inhalation BID  . buPROPion  150 mg Oral Daily  . diltiazem  120 mg Oral Daily  . DULoxetine  60 mg Oral Daily  . ezetimibe  10 mg Oral Daily  . feeding supplement  237 mL Oral BID BM  . ferrous sulfate  325 mg Oral Q breakfast    . filgrastim (NEUPOGEN)  SQ  480 mcg Subcutaneous ONCE-1800  . folic acid  1 mg Oral Daily  . fondaparinux  7.5 mg Subcutaneous QHS  . insulin aspart  0-9 Units Subcutaneous TID WC  . magnesium oxide  400  mg Oral QHS  . magnesium oxide  800 mg Oral Daily  . magnesium sulfate 1 - 4 g bolus IVPB  2 g Intravenous Once  . memantine  10 mg Oral Daily  . nebivolol  20 mg Oral Daily  . ondansetron (ZOFRAN) IV  4 mg Intravenous Once  . piperacillin-tazobactam (ZOSYN)  IV  3.375 g Intravenous Q8H  . potassium chloride  40 mEq Oral Q4H  . tiotropium  18 mcg Inhalation Daily  . vancomycin  1,000 mg Intravenous Q12H   Continuous Infusions:    . sodium chloride 1,000 mL (09/08/12 1752)  . sodium chloride 50 mL/hr at 09/08/12 2357    Principal Problem:  *Fall Active Problems:  Type II or unspecified type diabetes mellitus with renal manifestations, uncontrolled(250.42)  HYPERLIPIDEMIA  DEMENTIA  DEPRESSION  HYPERTENSION  COPD  ADENOCARCINOMA, RIGHT LUNG, UPPER LOBE  CAD (coronary artery disease)  PE (pulmonary embolism)  UTI (lower urinary tract infection)  Neutropenia  Encephalopathy  Dehydration  PNA (pneumonia)  Hypokalemia  Anemia    Time spent: > 35 mins    [facility]  [facility] Pager [phone]. If 8PM-8AM, please contact night-coverage at [email or web address], password [facility] 09/10/2012, 11:33 AM  LOS: 2 days

## 2012-09-10 NOTE — Progress Notes (Signed)
Dr Janee Morn informed of bp 96/62 new order noted to increase iv fluids to 75cc hour.

## 2012-09-11 ENCOUNTER — Telehealth: Payer: Self-pay

## 2012-09-11 DIAGNOSIS — K625 Hemorrhage of anus and rectum: Secondary | ICD-10-CM | POA: Diagnosis not present

## 2012-09-11 DIAGNOSIS — D649 Anemia, unspecified: Secondary | ICD-10-CM

## 2012-09-11 DIAGNOSIS — D709 Neutropenia, unspecified: Secondary | ICD-10-CM

## 2012-09-11 DIAGNOSIS — N390 Urinary tract infection, site not specified: Principal | ICD-10-CM

## 2012-09-11 LAB — CBC WITH DIFFERENTIAL/PLATELET
Basophils Relative: 0 % (ref 0–1)
Eosinophils Relative: 1 % (ref 0–5)
Hemoglobin: 8.1 g/dL — ABNORMAL LOW (ref 12.0–15.0)
Lymphocytes Relative: 20 % (ref 12–46)
Neutrophils Relative %: 76 % (ref 43–77)
RBC: 2.51 MIL/uL — ABNORMAL LOW (ref 3.87–5.11)
WBC: 2.3 10*3/uL — ABNORMAL LOW (ref 4.0–10.5)

## 2012-09-11 LAB — CBC
HCT: 26 % — ABNORMAL LOW (ref 36.0–46.0)
MCH: 31.7 pg (ref 26.0–34.0)
MCV: 91.5 fL (ref 78.0–100.0)
Platelets: 96 10*3/uL — ABNORMAL LOW (ref 150–400)
RBC: 2.84 MIL/uL — ABNORMAL LOW (ref 3.87–5.11)

## 2012-09-11 LAB — BASIC METABOLIC PANEL
GFR calc Af Amer: >90 mL/min (ref >90)
GFR calc non Af Amer: 84 mL/min — ABNORMAL LOW (ref >90)
Potassium: 2.7 mEq/L — CL (ref 3.5–5.1)
Sodium: 133 mEq/L — ABNORMAL LOW (ref 135–145)

## 2012-09-11 LAB — GLUCOSE, CAPILLARY: Glucose-Capillary: 118 mg/dL — ABNORMAL HIGH (ref 70–99)

## 2012-09-11 LAB — MAGNESIUM: Magnesium: 1.8 mg/dL (ref 1.5–2.5)

## 2012-09-11 MED ORDER — HYDROCORTISONE 2.5 % RE CREA
TOPICAL_CREAM | Freq: Two times a day (BID) | RECTAL | Status: AC
  Administered 2012-09-11 – 2012-09-13 (×6): via RECTAL
  Filled 2012-09-11: qty 28.35

## 2012-09-11 MED ORDER — POTASSIUM CHLORIDE CRYS ER 20 MEQ PO TBCR
40.0000 meq | EXTENDED_RELEASE_TABLET | ORAL | Status: AC
  Administered 2012-09-11 (×3): 40 meq via ORAL
  Filled 2012-09-11 (×3): qty 2

## 2012-09-11 MED ORDER — MAGNESIUM SULFATE 40 MG/ML IJ SOLN
2.0000 g | Freq: Once | INTRAMUSCULAR | Status: AC
  Administered 2012-09-11: 2 g via INTRAVENOUS
  Filled 2012-09-11: qty 50

## 2012-09-11 MED ORDER — POTASSIUM CHLORIDE CRYS ER 20 MEQ PO TBCR
40.0000 meq | EXTENDED_RELEASE_TABLET | Freq: Every day | ORAL | Status: DC
  Administered 2012-09-11 – 2012-09-17 (×7): 40 meq via ORAL
  Filled 2012-09-11 (×7): qty 2

## 2012-09-11 MED ORDER — POTASSIUM CHLORIDE 10 MEQ/100ML IV SOLN
10.0000 meq | INTRAVENOUS | Status: AC
  Administered 2012-09-11 (×4): 10 meq via INTRAVENOUS
  Filled 2012-09-11 (×4): qty 100

## 2012-09-11 NOTE — Progress Notes (Signed)
Physical Therapy Treatment Patient Details Name: Veronica Duarte MRN: [id number] DOB: February 11, 1946 Today's Date: 09/11/2012 Time: 1545-1610 PT Time Calculation (min): 25 min  PT Assessment / Plan / Recommendation Comments on Treatment Session  Pt demon very limted activity tolerance and 3/4 DOE.  Assisted pt to [facility] then back to bed only as this activity maxed her out.     Follow Up Recommendations   see PT evaluation     Does the patient have the potential to tolerate intense rehabilitation     Barriers to Discharge        Equipment Recommendations       Recommendations for Other Services    Frequency     Plan      Precautions / Restrictions     Pertinent Vitals/Pain     Mobility  Bed Mobility Bed Mobility: Supine to Sit;Sit to Supine Supine to Sit: 4: Min assist Sit to Supine: 3: Mod assist Details for Bed Mobility Assistance: pt demon very limited activity tolerance and requires rest breaks. Increased assisted needed back to bed due to MAX c/o fatigue after using BSC.  Transfers Transfers: Sit to Stand;Stand to Sit Sit to Stand: 4: Min assist;From bed;From toilet Stand to Sit: 4: Min assist;To toilet;To bed Details for Transfer Assistance: increased time and good use of hands.  very shaky throughout.  Demon 3/4 DOE.  Ambulation/Gait Ambulation/Gait Assistance Details: Pt unable to tolerate amb after using BSC     PT Goals                       progressing slowly   Visit Information  Last PT Received On: 09/11/12 Assistance Needed: +1    Subjective Data      Cognition    good   Balance   fair to fair + depending on level of fatigue  End of Session PT - End of Session Equipment Utilized During Treatment: Gait belt;Oxygen Activity Tolerance: Patient limited by fatigue Patient left: in bed;with call bell/phone within reach   Felecia Shelling  PTA [facility]  Acute  Rehab Pager     [phone]

## 2012-09-11 NOTE — Progress Notes (Signed)
Checked guaze and pad with only scant amount of blood. No more active bleeding as of now.

## 2012-09-11 NOTE — Progress Notes (Signed)
Pt continue to have active bleeding. Assessed perineal area with noted redness, swelling, and excoration. No open cuts or wounds but bleeding seems to be coming from this area. Applied guaze and mesh underwear and will check for saturation.  Pt is on a blood thinner.  MD notified. Hgb 9.0 on repeat CBC

## 2012-09-11 NOTE — Progress Notes (Signed)
[facility] PROGRESS NOTE  Veronica Duarte ZOX:[id number] DOB: [birth year] DOA: 09/08/2012 PCP: [name], MD  Assessment/Plan:  #1 fall Likely secondary to mechanical fall secondary to dehydration, urinary tract infection, plus or minus pneumonia. CT of the head was negative. Urinalysis consistent with a UTI. Urine cultures 35,000 colonies of Klebsiella pneumonia . Repeat chest x-ray this morning was concerning for right sided pneumonia. Continue empiric IV Zosyn and vancomycin to patient's current regimen. PT/OT  #2 pneumonia Patient does have a history of metastatic lung cancer status post chemotherapy recently. Patient has a neutropenia with a white count of 1.0.  Patient also presented with confusion. Will treat patient's pneumonia is a healthcare associated pneumonia secondary to immunocompromised state. Continue Zosyn and IV vancomycin.  follow.  #3 rectal bleeding GI has been consulted. A GI likely final perirectal and perineal bleeding. I recommended skin care consult and perineal skin care. Anusol has been added to patient's regimen. Follow.  #4 urinary tract infection Urine cultures and 35,000 colonies of Klebsiella pneumonia.  Continue IV Zosyn  #5 acute encephalopathy Likely secondary to urinary tract infection and probable pneumonia. Slowly improving clinically. Continue IV fluids. Continue empiric IV vancomycin and Zosyn. Follow. If worsens may consider MRI of the head to rule out metastatic disease.  #6 history of bilateral PE and DVT Continue Arixtra. Monitor closely with rectal bleeding.  #7 history of metastatic lung cancer Per oncology. Patient's oncologist is aware of patient's admission.  #8 right hip pain Plain films of the right hip are negative. Continue pain management.  #9 diarrhea C. difficile PCR is negative. Imodium PRN. Follow.  #10 neutropenia/anemia Likely secondary to recent chemotherapy therapy in the setting of urinary tract infection and  possible pneumonia. Blood cultures pending. Chest x-ray consistent with a pneumonia. Urine cultures with 35,000 colonies of Klebsiella pneumonia. Anemia panel pending. WBC improved after a dose of Neupogen.Continue empiric IV Zosyn. Continue IV vancomycin. Follow. Will also place on neutropenic precautions. Discussed this with patient's oncologist.  #11 COPD Stable. Nebs as needed. Oxygen. Continue empiric antibiotics for problem #2.  #12 obstructive sleep apnea CPAP each bedtime.  #13 hypokalemia Replete.  #14 prophylaxis On Arixtra for DVT prophylaxis.      Code Status: full Family Communication: Updated patient. No family at bedside. Disposition Plan: Home when medically stable   Consultants:  Gastroenterology: Dr. Juanda Chance 09/11/2012  Procedures:  CT head 09/09/2012  Chest x-ray 09/09/2012  Plain films of the right hip 09/09/2012  Antibiotics: IV Zosyn 09/08/2012 IV vancomycin 09/09/2012  HPI/Subjective:  Patient states is feeling better. Per nursing patient with some rectal bleeding this morning.  Objective: Filed Vitals:   09/11/12 0441 09/11/12 0837 09/11/12 1110 09/11/12 1418  BP: 115/67  90/57 93/61  Pulse: 60  74 94  Temp: 98.5 F (36.9 C)  97.5 F (36.4 C) 98 F (36.7 C)  TempSrc: Axillary  Oral Oral  Resp: 20  18 20   Height:      Weight:      SpO2: 93% 96% 98% 99%    Intake/Output Summary (Last 24 hours) at 09/11/12 1550 Last data filed at 09/11/12 1418  Gross per 24 hour  Intake 4156.25 ml  Output    200 ml  Net 3956.25 ml   Filed Weights   09/08/12 2300  Weight: 80 kg (176 lb 5.9 oz)    Exam:   General:  NAD  Cardiovascular: RRR  Respiratory: CTAB  Abdomen: SOFT/nd/nd/+ bs  Data Reviewed: Basic Metabolic Panel:  Lab  09/11/12 1410 09/11/12 0415 09/10/12 0435 09/09/12 0514 09/08/12 1645  NA -- 133* 133* 130* 132*  K 3.6 2.7* 3.6 2.8* 3.7  CL -- 100 100 96 95*  CO2 -- 21 21 24 26   GLUCOSE -- 87 102* 90 108*  BUN -- 7 8  11 18   CREATININE -- 0.78 0.76 0.72 0.83  CALCIUM -- 8.7 8.8 8.6 9.1  MG -- 1.8 1.7 1.5 --  PHOS -- -- -- -- --   Liver Function Tests:  Lab 09/08/12 1645  AST 22  ALT 13  ALKPHOS 51  BILITOT 0.6  PROT 6.0  ALBUMIN 2.9*   No results found for this basename: LIPASE:5,AMYLASE:5 in the last 168 hours No results found for this basename: AMMONIA:5 in the last 168 hours CBC:  Lab 09/11/12 1000 09/11/12 0415 09/10/12 0435 09/09/12 0514 09/08/12 1645  WBC 2.7* 2.3* 1.5* 1.0* 1.3*  NEUTROABS -- 1.7 1.1* -- 0.8*  HGB 9.0* 8.1* 8.6* 8.8* 9.7*  HCT 26.0* 22.9* 25.6* 25.7* 28.3*  MCV 91.5 91.2 92.4 92.8 92.8  PLT 96* 85* 134* 165 190   Cardiac Enzymes: No results found for this basename: CKTOTAL:5,CKMB:5,CKMBINDEX:5,TROPONINI:5 in the last 168 hours BNP (last 3 results) No results found for this basename: PROBNP:3 in the last 8760 hours CBG:  Lab 09/11/12 1200 09/11/12 0738 09/10/12 2136 09/10/12 1650 09/10/12 1209  GLUCAP 102* 76 86 102* 98    Recent Results (from the past 240 hour(s))  URINE CULTURE     Status: Normal   Collection Time   09/08/12  5:15 PM      Component Value Range Status Comment   Specimen Description URINE, CLEAN CATCH   Final    Special Requests NONE   Final    Culture  Setup Time 09/09/2012 03:16   Final    Colony Count 35,000 COLONIES/ML   Final    Culture KLEBSIELLA PNEUMONIAE   Final    Report Status 09/10/2012 FINAL   Final    Organism ID, Bacteria KLEBSIELLA PNEUMONIAE   Final   CLOSTRIDIUM DIFFICILE BY PCR     Status: Normal   Collection Time   09/09/12  1:15 AM      Component Value Range Status Comment   C difficile by pcr NEGATIVE  NEGATIVE Final   CULTURE, BLOOD (ROUTINE X 2)     Status: Normal (Preliminary result)   Collection Time   09/09/12  4:00 PM      Component Value Range Status Comment   Specimen Description BLOOD RIGHT HAND   Final    Special Requests BOTTLES DRAWN AEROBIC AND ANAEROBIC 10CC   Final    Culture  Setup Time  09/09/2012 23:20   Final    Culture     Final    Value:        BLOOD CULTURE RECEIVED NO GROWTH TO DATE CULTURE WILL BE HELD FOR 5 DAYS BEFORE ISSUING A FINAL NEGATIVE REPORT   Report Status PENDING   Incomplete   CULTURE, BLOOD (ROUTINE X 2)     Status: Normal (Preliminary result)   Collection Time   09/09/12  4:20 PM      Component Value Range Status Comment   Specimen Description BLOOD RIGHT ARM   Final    Special Requests BOTTLES DRAWN AEROBIC AND ANAEROBIC 10CC   Final    Culture  Setup Time 09/09/2012 23:20   Final    Culture     Final    Value:  BLOOD CULTURE RECEIVED NO GROWTH TO DATE CULTURE WILL BE HELD FOR 5 DAYS BEFORE ISSUING A FINAL NEGATIVE REPORT   Report Status PENDING   Incomplete      Studies: No results found.  Scheduled Meds:    . atorvastatin  20 mg Oral q1800  . budesonide-formoterol  2 puff Inhalation BID  . buPROPion  150 mg Oral Daily  . diltiazem  120 mg Oral Daily  . DULoxetine  60 mg Oral Daily  . ezetimibe  10 mg Oral Daily  . feeding supplement  237 mL Oral BID BM  . ferrous sulfate  325 mg Oral Q breakfast  . folic acid  1 mg Oral Daily  . fondaparinux  7.5 mg Subcutaneous QHS  . hydrocortisone   Rectal BID  . insulin aspart  0-9 Units Subcutaneous TID WC  . magnesium oxide  400 mg Oral QHS  . magnesium oxide  800 mg Oral Daily  . magnesium sulfate 1 - 4 g bolus IVPB  2 g Intravenous Once  . memantine  10 mg Oral Daily  . ondansetron (ZOFRAN) IV  4 mg Intravenous Once  . piperacillin-tazobactam (ZOSYN)  IV  3.375 g Intravenous Q8H  . potassium chloride  10 mEq Intravenous Q1 Hr x 4  . potassium chloride  40 mEq Oral Q4H  . tiotropium  18 mcg Inhalation Daily  . vancomycin  1,000 mg Intravenous Q12H  . DISCONTD: nebivolol  20 mg Oral Daily   Continuous Infusions:    . sodium chloride 50 mL/hr at 09/08/12 2357  . DISCONTD: sodium chloride 1,000 mL (09/08/12 1752)    Principal Problem:  *Fall Active Problems:  Type II or  unspecified type diabetes mellitus with renal manifestations, uncontrolled(250.42)  HYPERLIPIDEMIA  DEMENTIA  DEPRESSION  HYPERTENSION  COPD  ADENOCARCINOMA, RIGHT LUNG, UPPER LOBE  CAD (coronary artery disease)  PE (pulmonary embolism)  UTI (lower urinary tract infection)  Neutropenia  Encephalopathy  Dehydration  PNA (pneumonia)  Hypokalemia  Anemia  Rectal bleeding    Time spent: > 35 mins    [facility]  [facility] Pager [phone]. If 8PM-8AM, please contact night-coverage at [email or web address], password [email or web address] 09/11/2012, 3:50 PM  LOS: 3 days

## 2012-09-11 NOTE — Telephone Encounter (Signed)
FYI.... Patient daughter called to inform MD that they have decided to put off appt for mammogram at this time due to some other issue that have come up. They will reschedule at a later date.

## 2012-09-11 NOTE — Progress Notes (Deleted)
[facility] Consult: 12:26 PM 09/11/2012   Referring Provider: Ramiro Harvest, MD  Primary Care Physician:  [name], MD Primary Gastroenterologist:  unassigned   Reason for Consultation:  Blood in stool  HPI: Veronica Duarte is a 66 y.o. female.  Is undergoing chemotherapy for metastatic adenocarcinoma of lung, right UL wedge resection on 02/14/11.  Has OSA, uses CPAP at night but no home oxygen for COPD. Started Arixtra 05/2012 for DVT/bilateral PE. Acute renal failure in 04/2012.  Has dementia at baseline.  Admitted 09/08/12 after fall at home.  Labs showed UTI, neutropenia, hyponatremia. She had loose stools for 2 to 3 days PTA. C diff was positive in 04/2012, it was negative 09/09/12. S diff was treated with Flagyl PO and diarrhea resolved by discharge of 04/20/12 .  She had previously received Keflex for E Coli UTI.   Last night she had a BM that was surrounded by at most, moderate volume of red blood.  This AM had unformed loose BM also mixed with blood.  On RN exam the skin of the perineum, from groin to vaginal folds, is excoriated, erythematous and appears to be oozing small volume of blood. Hgb was rechecked and it is 9.0 compared to 8.1 6 hours ago. Hgb range in Sept 2013 was 9.1 to 10.7. It was 9.7 on 10/22 admission but, with hydration went to 8.8 within 24 hours. MCV consistently in the 90s.    She is on neutropenic precautions.  Platelets are on declining trend, 96 today.  PT/INR not obtained.   Pt never had BPR in past.  Charting in Epic shows adenomatous polyps in 01/2004.  However calls to [place], [facility], [facility], and review of all old records fail to demonstrate who/where colonoscopy was done.  Pt does not recall who did this either. She denies having received letters for follow up colonoscopy.  She has no belly pain, no anorexia, no dysphagia, no N/V.  Weight according to her is stable but I count 15 # weight loss  since H & P of 04/17/12.      Past Medical History  Diagnosis Date  . Diabetes mellitus     type 2- metformin  . Depression   . RAS (renal artery stenosis)   . AAA (abdominal aortic aneurysm)   . Osteoarthritis   . COPD (chronic obstructive pulmonary disease)   . Hx of colonic polyps   . CAD (coronary artery disease)   . OSA (obstructive sleep apnea)     Uses CPAP pressure is 6 study was done > 5 years  . Hypertension     Sees Dr. Tresa Endo with [facility]  . ADENOCARCINOMA, RIGHT LUNG, UPPER LOBE 02/04/2011    Past Surgical History  Procedure Date  . 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 Vesicovaginal fistula closure w/ tah   . Abdominal hysterectomy   . Right video-assisted thoracoscopy with wedge resection of 02/14/11    Dr Evelene Croon  . Cardiac catheterization 08/30/2009    Dr Aleen Campi  . Intraoperative transesophageal echocardiography. 08/30/2009    Dr Kipp Brood  . Mediastinoscopy 04/03/2012    Procedure: MEDIASTINOSCOPY;  Surgeon: Alleen Borne, MD;  Location: [facility] OR;  Service: Thoracic;  Laterality: N/A;  . Portacath placement 04/03/2012    Procedure: INSERTION PORT-A-CATH;  Surgeon: Alleen Borne, MD;  Location: [facility] OR;  Service: Thoracic;  Laterality: Left;  Insertion of 9.6Fr. Pre-attached power port in Left Subclavian  Prior to Admission medications   Medication Sig Start Date End Date Taking? Authorizing Provider  Aclidinium Bromide (TUDORZA PRESSAIR) 400 MCG/ACT AEPB Inhale 1 puff into the lungs 2 (two) times daily. 09/02/12  Yes Etta Grandchild, MD  albuterol (PROVENTIL HFA;VENTOLIN HFA) 108 (90 BASE) MCG/ACT inhaler Inhale 2 puffs into the lungs every 6 (six) hours as needed. For shortness of breath 08/15/11  Yes Waymon Budge, MD  buPROPion (WELLBUTRIN XL) 150 MG 24 hr tablet Take 1 tablet (150 mg total) by mouth daily. 09/02/12 09/02/13 Yes Etta Grandchild, MD  cetirizine (ZYRTEC) 10 MG tablet Take 1  tablet (10 mg total) by mouth daily as needed. For allergies 09/02/12  Yes Etta Grandchild, MD  dexamethasone (DECADRON) 4 MG tablet Take 4 mg by mouth See admin instructions. 1 tab BID day before, during, and after chemo 08/13/12  Yes Si Gaul, MD  diltiazem (CARDIZEM CD) 120 MG 24 hr capsule Take 1 capsule (120 mg total) by mouth daily. 09/02/12  Yes Etta Grandchild, MD  DULoxetine (CYMBALTA) 60 MG capsule Take 1 capsule (60 mg total) by mouth daily. 09/02/12  Yes Etta Grandchild, MD  ezetimibe (ZETIA) 10 MG tablet Take 1 tablet (10 mg total) by mouth daily. 09/02/12  Yes Etta Grandchild, MD  ferrous sulfate 325 (65 FE) MG tablet Take 1 tablet (325 mg total) by mouth daily with breakfast. 09/02/12  Yes Etta Grandchild, MD  folic acid (FOLVITE) 1 MG tablet Take 1 tablet (1 mg total) by mouth daily. 09/02/12 09/02/13 Yes Etta Grandchild, MD  fondaparinux (ARIXTRA) 7.5 MG/0.6ML SOLN Inject 0.6 mLs (7.5 mg total) into the skin daily. 09/03/12  Yes Si Gaul, MD  guaiFENesin (MUCINEX) 600 MG 12 hr tablet Take 600 mg by mouth 2 (two) times daily. For congestion   Yes Historical Provider, MD  lidocaine-prilocaine (EMLA) cream Apply 1 application topically as needed. Apply to port 1 hour before chemo appointment 04/01/12 04/01/13 Yes Si Gaul, MD  magnesium oxide (MAG-OX) 400 (241.3 MG) MG tablet Take 400-800 mg by mouth 2 (two) times daily. Takes 2 tablets in the morning and 1 tablet at night 09/02/12 09/02/13 Yes Etta Grandchild, MD  Memantine HCl ER (NAMENDA XR) 14 MG CP24 Take 1 tablet by mouth daily.   Yes Historical Provider, MD  Multiple Vitamin (MULITIVITAMIN WITH MINERALS) TABS Take 1 tablet by mouth daily.   Yes Historical Provider, MD  Nebivolol HCl (BYSTOLIC) 20 MG TABS Take 1 tablet (20 mg total) by mouth daily. 09/01/12  Yes Etta Grandchild, MD  niacin (NIASPAN) 1000 MG CR tablet Take 1,000 mg by mouth at bedtime.    Yes Historical Provider, MD  nitrofurantoin,  macrocrystal-monohydrate, (MACROBID) 100 MG capsule Take 100 mg by mouth at bedtime.   Yes Historical Provider, MD  PRESCRIPTION MEDICATION Inject into the vein every 21 ( twenty-one) days. Alimta and Carboplatin every 3 weeks.   Yes Historical Provider, MD  prochlorperazine (COMPAZINE) 10 MG tablet Take 1 tablet (10 mg total) by mouth every 6 (six) hours as needed. For nausea 09/02/12 09/09/14 Yes Etta Grandchild, MD  rosuvastatin (CRESTOR) 20 MG tablet Take 20 mg by mouth daily.   Yes Historical Provider, MD  SYMBICORT 160-4.5 MCG/ACT inhaler INHALE 2 PUFFS INTO THE LUNGS 2 TIMES   DAILY 07/21/12  Yes Etta Grandchild, MD  vitamin C (ASCORBIC ACID) 500 MG tablet Take 500 mg by mouth 2 (two) times daily.    Yes Historical  Provider, MD    Scheduled Meds:    . atorvastatin  20 mg Oral q1800  . budesonide-formoterol  2 puff Inhalation BID  . buPROPion  150 mg Oral Daily  . diltiazem  120 mg Oral Daily  . DULoxetine  60 mg Oral Daily  . ezetimibe  10 mg Oral Daily  . feeding supplement  237 mL Oral BID BM  . ferrous sulfate  325 mg Oral Q breakfast  . folic acid  1 mg Oral Daily  . fondaparinux  7.5 mg Subcutaneous QHS  . insulin aspart  0-9 Units Subcutaneous TID WC  . magnesium oxide  400 mg Oral QHS  . magnesium oxide  800 mg Oral Daily  . magnesium sulfate 1 - 4 g bolus IVPB  2 g Intravenous Once  . memantine  10 mg Oral Daily  . ondansetron (ZOFRAN) IV  4 mg Intravenous Once  . piperacillin-tazobactam (ZOSYN)  IV  3.375 g Intravenous Q8H  . potassium chloride  10 mEq Intravenous Q1 Hr x 4  . potassium chloride  40 mEq Oral Q4H  . tiotropium  18 mcg Inhalation Daily  . vancomycin  1,000 mg Intravenous Q12H  . DISCONTD: nebivolol  20 mg Oral Daily   Infusions:    . sodium chloride 50 mL/hr at 09/08/12 2357  . DISCONTD: sodium chloride 1,000 mL (09/08/12 1752)   PRN Meds: acetaminophen, acetaminophen, acetaminophen, albuterol, albuterol, ipratropium, loperamide, ondansetron  (ZOFRAN) IV, ondansetron, sodium chloride   Allergies as of 09/08/2012  . (No Known Allergies)    Family History  Problem Relation Age of Onset  . Arthritis    . Hyperlipidemia    . Hypertension    . Depression    . Anesthesia problems Neg Hx     History   Social History  . Marital Status: Divorced    Spouse Name: N/A    Number of Children: N/A  . Years of Education: N/A   Occupational History  . retired    Social History Main Topics  . Smoking status: Former Smoker -- 2.0 packs/day for 30 years    Types: Cigarettes    Quit date: 11/18/2002  . Smokeless tobacco: Never Used  . Alcohol Use: No  . Drug Use: No  . Sexually Active: Not Currently   Other Topics Concern  . Not on file   Social History Narrative  . No narrative on file    REVIEW OF SYSTEMS: Constitutional:  191 # on 04/18/11, 176 # currently.  ENT:  No nose bleeds Pulm:  positive for cough and dyspnea/SOB CV:  No chest pain or palpitations  GU:  No dysuria.  No frequency.  Some incontinence GI:  As per HPI Heme:  Denies previous problems with anemia.    Transfusions:  Denies previous transfusion Neuro:  rare headaches.  Balance is compromised along with unsteady gait Derm:  Denies sores. She denied rash but does say the skin between her legs feels like it's burning. Endocrine:  No excessive thirst, no night sweats Immunization:  Flu shot given 09/01/2012 Travel:  none   PHYSICAL EXAM: Vital signs in last 24 hours: Temp:  [97.5 F (36.4 C)-98.5 F (36.9 C)] 97.5 F (36.4 C) (10/25 1110) Pulse Rate:  [54-74] 74  (10/25 1110) Resp:  [18-20] 18  (10/25 1110) BP: (90-115)/(52-67) 90/57 mmHg (10/25 1110) SpO2:  [93 %-98 %] 98 % (10/25 1110)  General: obese, unwell, pleasant elderly white female. Looks older than stated age Head:  No facial  edema, no asymmetry  Eyes:  No icterus, no conjunctival pallor Ears:  No obvious hearing deficit  Nose:  No congestion or discharge Mouth:  Oropharynx is  moist and clear no exudates, no lesions Neck:  No JVD, no bruits, no masses Lungs:  Diminished breath sounds throughout. Significant dyspnea with a minor effort. No cough Heart: regular rate rhythm.   no murmurs rubs or gallops Abdomen:  Obese, nontender, soft, active bowel sounds. Sub-centimeter subcutaneous nodule noted within bruised area in left lower quadrant.   Rectal: hemorrhoids present externally, these are hyperemic but not thrombosed or tender. I did not perform digital rectal exam given the neutropenia. Musc/Skeltl: no gross joint deformity other than scarring on the top of the left hand from prior surgeries following infected Bite.   Extremities:  No pedal edema  Neurologic:  Pleasant, oriented to hospital but not to year. She has a resting tremor in the hands, upper extremities and lower extremities.  Observed while patient transferring from lounge chair to bed is quite unsteady. Skin:  Visible hematomas in the abdomen. Hyperemic skin and some excoriation in region of perineum and vulvo- vaginal regions Tattoos:  none Nodes:  No cervical adenopathy   Psych:  Pleasant, cooperative.  Not inappropriate. Not depressed or agitated  Intake/Output from previous day: 10/24 0701 - 10/25 0700 In: 4126.3 [P.O.:120; I.V.:3456.3; IV Piggyback:550] Out: 200 [Urine:200] Intake/Output this shift:    LAB RESULTS:  Basename 09/11/12 1000 09/11/12 0415 09/10/12 0435  WBC 2.7* 2.3* 1.5*  HGB 9.0* 8.1* 8.6*  HCT 26.0* 22.9* 25.6*  PLT 96* 85* 134*   BMET Lab Results  Component Value Date   NA 133* 09/11/2012   NA 133* 09/10/2012   NA 130* 09/09/2012   K 2.7* 09/11/2012   K 3.6 09/10/2012   K 2.8* 09/09/2012   CL 100 09/11/2012   CL 100 09/10/2012   CL 96 09/09/2012   CO2 21 09/11/2012   CO2 21 09/10/2012   CO2 24 09/09/2012   GLUCOSE 87 09/11/2012   GLUCOSE 102* 09/10/2012   GLUCOSE 90 09/09/2012   BUN 7 09/11/2012   BUN 8 09/10/2012   BUN 11 09/09/2012   CREATININE 0.78  09/11/2012   CREATININE 0.76 09/10/2012   CREATININE 0.72 09/09/2012   CALCIUM 8.7 09/11/2012   CALCIUM 8.8 09/10/2012   CALCIUM 8.6 09/09/2012   LFT  Basename 09/08/12 1645  PROT 6.0  ALBUMIN 2.9*  AST 22  ALT 13  ALKPHOS 51  BILITOT 0.6  BILIDIR --  IBILI --   PT/INR Lab Results  Component Value Date   INR 1.06 04/02/2012   INR 1.07 03/24/2012   INR 1.10 02/13/2011   C-Diff 09/09/12  negative     RADIOLOGY STUDIES: No results found.  ENDOSCOPIC STUDIES: Nothing found just the reference to a 2005 history of adenomatous colon polyps  IMPRESSION: * Minor perirectal, perineal bleeding. She is on Arixtra which makes her more prone to bleeding. Platelets are low however at current level would not expect spontaneous bleeding. *  History of bilateral PE and DVT for which she was started on Arixtra in July 2013 *  Metastatic adenocarcinoma of the lung. Receiving chemotherapy up until last week. *  Obstructive sleep apnea, on CPAP at night. *  COPD *  Diabetes mellitus *  UTI, klebsiella.  *  Normocytic anemia.  Takes po Iron chronically.   PLAN: *  Would obtain skin care consult and begin some sort of perineal skin care as per  their recommendation. *  No colonoscopy. She is too many respiratory, conditioning issues and is neutropenic as well.  All of these are impediments to sedation. *  If diarrhea continues to be a problem, consider stopping the daily oral magnesium.  *  Add Anusol cream for 3 days.     LOS: 3 days   Jennye Moccasin  09/11/2012, 12:26 PM Pager: [phone]

## 2012-09-11 NOTE — Consult Note (Signed)
[facility] Consult: 12:26 PM 09/11/2012   Referring Provider: Ramiro Harvest, MD  Primary Care Physician:  [name], MD Primary Gastroenterologist:  unassigned   Reason for Consultation:  Blood in stool  HPI: Veronica Duarte is a 66 y.o. female.  Is undergoing chemotherapy for metastatic adenocarcinoma of lung, right UL wedge resection on 02/14/11.  Has OSA, uses CPAP at night but no home oxygen for COPD. Started Arixtra 05/2012 for DVT/bilateral PE. Acute renal failure in 04/2012.  Has dementia at baseline.  Admitted 09/08/12 after fall at home.  Labs showed UTI, neutropenia, hyponatremia. She had loose stools for 2 to 3 days PTA. C diff was positive in 04/2012, it was negative 09/09/12. S diff was treated with Flagyl PO and diarrhea resolved by discharge of 04/20/12 .  She had previously received Keflex for E Coli UTI.   Last night she had a BM that was surrounded by at most, moderate volume of red blood.  This AM had unformed loose BM also mixed with blood.  On RN exam the skin of the perineum, from groin to vaginal folds, is excoriated, erythematous and appears to be oozing small volume of blood. Hgb was rechecked and it is 9.0 compared to 8.1 6 hours ago. Hgb range in Sept 2013 was 9.1 to 10.7. It was 9.7 on 10/22 admission but, with hydration went to 8.8 within 24 hours. MCV consistently in the 90s.    She is on neutropenic precautions.  Platelets are on declining trend, 96 today.  PT/INR not obtained.   Pt never had BPR in past.  Charting in Epic shows adenomatous polyps in 01/2004.  However calls to [facility], [facility], and review of all old records fail to demonstrate who/where colonoscopy was done.  Pt does not recall who did this either. She denies having received letters for follow up colonoscopy.  She has no belly pain, no anorexia, no dysphagia, no N/V.  Weight according to her is stable but I count 15 # weight loss since H & P  of 04/17/12.      Past Medical History  Diagnosis Date  . Diabetes mellitus     type 2- metformin  . Depression   . RAS (renal artery stenosis)   . AAA (abdominal aortic aneurysm)   . Osteoarthritis   . COPD (chronic obstructive pulmonary disease)   . Hx of colonic polyps   . CAD (coronary artery disease)   . OSA (obstructive sleep apnea)     Uses CPAP pressure is 6 study was done > 5 years  . Hypertension     Sees Dr. Tresa Endo with [facility]  . ADENOCARCINOMA, RIGHT LUNG, UPPER LOBE 02/04/2011    Past Surgical History  Procedure Date  . 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 Vesicovaginal fistula closure w/ tah   . Abdominal hysterectomy   . Right video-assisted thoracoscopy with wedge resection of 02/14/11    Dr Evelene Croon  . Cardiac catheterization 08/30/2009    Dr Aleen Campi  . Intraoperative transesophageal echocardiography. 08/30/2009    Dr Kipp Brood  . Mediastinoscopy 04/03/2012    Procedure: MEDIASTINOSCOPY;  Surgeon: Alleen Borne, MD;  Location: [facility] OR;  Service: Thoracic;  Laterality: N/A;  . Portacath placement 04/03/2012    Procedure: INSERTION PORT-A-CATH;  Surgeon: Alleen Borne, MD;  Location: [facility] OR;  Service: Thoracic;  Laterality: Left;  Insertion of 9.6Fr. Pre-attached power port in Left Subclavian  Prior to Admission medications   Medication Sig Start Date End Date Taking? Authorizing Provider  Aclidinium Bromide (TUDORZA PRESSAIR) 400 MCG/ACT AEPB Inhale 1 puff into the lungs 2 (two) times daily. 09/02/12  Yes Etta Grandchild, MD  albuterol (PROVENTIL HFA;VENTOLIN HFA) 108 (90 BASE) MCG/ACT inhaler Inhale 2 puffs into the lungs every 6 (six) hours as needed. For shortness of breath 08/15/11  Yes Waymon Budge, MD  buPROPion (WELLBUTRIN XL) 150 MG 24 hr tablet Take 1 tablet (150 mg total) by mouth daily. 09/02/12 09/02/13 Yes Etta Grandchild, MD  cetirizine (ZYRTEC) 10 MG tablet Take 1 tablet (10 mg  total) by mouth daily as needed. For allergies 09/02/12  Yes Etta Grandchild, MD  dexamethasone (DECADRON) 4 MG tablet Take 4 mg by mouth See admin instructions. 1 tab BID day before, during, and after chemo 08/13/12  Yes Si Gaul, MD  diltiazem (CARDIZEM CD) 120 MG 24 hr capsule Take 1 capsule (120 mg total) by mouth daily. 09/02/12  Yes Etta Grandchild, MD  DULoxetine (CYMBALTA) 60 MG capsule Take 1 capsule (60 mg total) by mouth daily. 09/02/12  Yes Etta Grandchild, MD  ezetimibe (ZETIA) 10 MG tablet Take 1 tablet (10 mg total) by mouth daily. 09/02/12  Yes Etta Grandchild, MD  ferrous sulfate 325 (65 FE) MG tablet Take 1 tablet (325 mg total) by mouth daily with breakfast. 09/02/12  Yes Etta Grandchild, MD  folic acid (FOLVITE) 1 MG tablet Take 1 tablet (1 mg total) by mouth daily. 09/02/12 09/02/13 Yes Etta Grandchild, MD  fondaparinux (ARIXTRA) 7.5 MG/0.6ML SOLN Inject 0.6 mLs (7.5 mg total) into the skin daily. 09/03/12  Yes Si Gaul, MD  guaiFENesin (MUCINEX) 600 MG 12 hr tablet Take 600 mg by mouth 2 (two) times daily. For congestion   Yes Historical Provider, MD  lidocaine-prilocaine (EMLA) cream Apply 1 application topically as needed. Apply to port 1 hour before chemo appointment 04/01/12 04/01/13 Yes Si Gaul, MD  magnesium oxide (MAG-OX) 400 (241.3 MG) MG tablet Take 400-800 mg by mouth 2 (two) times daily. Takes 2 tablets in the morning and 1 tablet at night 09/02/12 09/02/13 Yes Etta Grandchild, MD  Memantine HCl ER (NAMENDA XR) 14 MG CP24 Take 1 tablet by mouth daily.   Yes Historical Provider, MD  Multiple Vitamin (MULITIVITAMIN WITH MINERALS) TABS Take 1 tablet by mouth daily.   Yes Historical Provider, MD  Nebivolol HCl (BYSTOLIC) 20 MG TABS Take 1 tablet (20 mg total) by mouth daily. 09/01/12  Yes Etta Grandchild, MD  niacin (NIASPAN) 1000 MG CR tablet Take 1,000 mg by mouth at bedtime.    Yes Historical Provider, MD  nitrofurantoin, macrocrystal-monohydrate, (MACROBID)  100 MG capsule Take 100 mg by mouth at bedtime.   Yes Historical Provider, MD  PRESCRIPTION MEDICATION Inject into the vein every 21 ( twenty-one) days. Alimta and Carboplatin every 3 weeks.   Yes Historical Provider, MD  prochlorperazine (COMPAZINE) 10 MG tablet Take 1 tablet (10 mg total) by mouth every 6 (six) hours as needed. For nausea 09/02/12 09/09/14 Yes Etta Grandchild, MD  rosuvastatin (CRESTOR) 20 MG tablet Take 20 mg by mouth daily.   Yes Historical Provider, MD  SYMBICORT 160-4.5 MCG/ACT inhaler INHALE 2 PUFFS INTO THE LUNGS 2 TIMES   DAILY 07/21/12  Yes Etta Grandchild, MD  vitamin C (ASCORBIC ACID) 500 MG tablet Take 500 mg by mouth 2 (two) times daily.    Yes Historical  Provider, MD    Scheduled Meds:    . atorvastatin  20 mg Oral q1800  . budesonide-formoterol  2 puff Inhalation BID  . buPROPion  150 mg Oral Daily  . diltiazem  120 mg Oral Daily  . DULoxetine  60 mg Oral Daily  . ezetimibe  10 mg Oral Daily  . feeding supplement  237 mL Oral BID BM  . ferrous sulfate  325 mg Oral Q breakfast  . folic acid  1 mg Oral Daily  . fondaparinux  7.5 mg Subcutaneous QHS  . insulin aspart  0-9 Units Subcutaneous TID WC  . magnesium oxide  400 mg Oral QHS  . magnesium oxide  800 mg Oral Daily  . magnesium sulfate 1 - 4 g bolus IVPB  2 g Intravenous Once  . memantine  10 mg Oral Daily  . ondansetron (ZOFRAN) IV  4 mg Intravenous Once  . piperacillin-tazobactam (ZOSYN)  IV  3.375 g Intravenous Q8H  . potassium chloride  10 mEq Intravenous Q1 Hr x 4  . potassium chloride  40 mEq Oral Q4H  . tiotropium  18 mcg Inhalation Daily  . vancomycin  1,000 mg Intravenous Q12H  . DISCONTD: nebivolol  20 mg Oral Daily   Infusions:    . sodium chloride 50 mL/hr at 09/08/12 2357  . DISCONTD: sodium chloride 1,000 mL (09/08/12 1752)   PRN Meds: acetaminophen, acetaminophen, acetaminophen, albuterol, albuterol, ipratropium, loperamide, ondansetron (ZOFRAN) IV, ondansetron, sodium  chloride   Allergies as of 09/08/2012  . (No Known Allergies)    Family History  Problem Relation Age of Onset  . Arthritis    . Hyperlipidemia    . Hypertension    . Depression    . Anesthesia problems Neg Hx     History   Social History  . Marital Status: Divorced    Spouse Name: N/A    Number of Children: N/A  . Years of Education: N/A   Occupational History  . retired    Social History Main Topics  . Smoking status: Former Smoker -- 2.0 packs/day for 30 years    Types: Cigarettes    Quit date: 11/18/2002  . Smokeless tobacco: Never Used  . Alcohol Use: No  . Drug Use: No  . Sexually Active: Not Currently   Other Topics Concern  . Not on file   Social History Narrative  . No narrative on file    REVIEW OF SYSTEMS: Constitutional:  191 # on 04/18/11, 176 # currently.  ENT:  No nose bleeds Pulm:  positive for cough and dyspnea/SOB CV:  No chest pain or palpitations  GU:  No dysuria.  No frequency.  Some incontinence GI:  As per HPI Heme:  Denies previous problems with anemia.    Transfusions:  Denies previous transfusion Neuro:  rare headaches.  Balance is compromised along with unsteady gait Derm:  Denies sores. She denied rash but does say the skin between her legs feels like it's burning. Endocrine:  No excessive thirst, no night sweats Immunization:  Flu shot given 09/01/2012 Travel:  none   PHYSICAL EXAM: Vital signs in last 24 hours: Temp:  [97.5 F (36.4 C)-98.5 F (36.9 C)] 97.5 F (36.4 C) (10/25 1110) Pulse Rate:  [54-74] 74  (10/25 1110) Resp:  [18-20] 18  (10/25 1110) BP: (90-115)/(52-67) 90/57 mmHg (10/25 1110) SpO2:  [93 %-98 %] 98 % (10/25 1110)  General: obese, unwell, pleasant elderly white female. Looks older than stated age Head:  No facial  edema, no asymmetry  Eyes:  No icterus, no conjunctival pallor Ears:  No obvious hearing deficit  Nose:  No congestion or discharge Mouth:  Oropharynx is moist and clear no exudates, no  lesions Neck:  No JVD, no bruits, no masses Lungs:  Diminished breath sounds throughout. Significant dyspnea with a minor effort. No cough Heart: regular rate rhythm.   no murmurs rubs or gallops Abdomen:  Obese, nontender, soft, active bowel sounds. Sub-centimeter subcutaneous nodule noted within bruised area in left lower quadrant.   Rectal: hemorrhoids present externally, these are hyperemic but not thrombosed or tender. I did not perform digital rectal exam given the neutropenia. Musc/Skeltl: no gross joint deformity other than scarring on the top of the left hand from prior surgeries following infected Bite.   Extremities:  No pedal edema  Neurologic:  Pleasant, oriented to hospital but not to year. She has a resting tremor in the hands, upper extremities and lower extremities.  Observed while patient transferring from lounge chair to bed is quite unsteady. Skin:  Visible hematomas in the abdomen. Hyperemic skin and some excoriation in region of perineum and vulvo- vaginal regions Tattoos:  none Nodes:  No cervical adenopathy   Psych:  Pleasant, cooperative.  Not inappropriate. Not depressed or agitated  Intake/Output from previous day: 10/24 0701 - 10/25 0700 In: 4126.3 [P.O.:120; I.V.:3456.3; IV Piggyback:550] Out: 200 [Urine:200] Intake/Output this shift:    LAB RESULTS:  Basename 09/11/12 1000 09/11/12 0415 09/10/12 0435  WBC 2.7* 2.3* 1.5*  HGB 9.0* 8.1* 8.6*  HCT 26.0* 22.9* 25.6*  PLT 96* 85* 134*   BMET Lab Results  Component Value Date   NA 133* 09/11/2012   NA 133* 09/10/2012   NA 130* 09/09/2012   K 2.7* 09/11/2012   K 3.6 09/10/2012   K 2.8* 09/09/2012   CL 100 09/11/2012   CL 100 09/10/2012   CL 96 09/09/2012   CO2 21 09/11/2012   CO2 21 09/10/2012   CO2 24 09/09/2012   GLUCOSE 87 09/11/2012   GLUCOSE 102* 09/10/2012   GLUCOSE 90 09/09/2012   BUN 7 09/11/2012   BUN 8 09/10/2012   BUN 11 09/09/2012   CREATININE 0.78 09/11/2012   CREATININE 0.76  09/10/2012   CREATININE 0.72 09/09/2012   CALCIUM 8.7 09/11/2012   CALCIUM 8.8 09/10/2012   CALCIUM 8.6 09/09/2012   LFT  Basename 09/08/12 1645  PROT 6.0  ALBUMIN 2.9*  AST 22  ALT 13  ALKPHOS 51  BILITOT 0.6  BILIDIR --  IBILI --   PT/INR Lab Results  Component Value Date   INR 1.06 04/02/2012   INR 1.07 03/24/2012   INR 1.10 02/13/2011   C-Diff 09/09/12  negative     RADIOLOGY STUDIES: No results found.  ENDOSCOPIC STUDIES: Nothing found just the reference to a 2005 history of adenomatous colon polyps  IMPRESSION: * Minor perirectal, perineal bleeding. She is on Arixtra which makes her more prone to bleeding. Platelets are low however at current level would not expect spontaneous bleeding. *  History of bilateral PE and DVT for which she was started on Arixtra in July 2013 *  Metastatic adenocarcinoma of the lung. Receiving chemotherapy up until last week. *  Obstructive sleep apnea, on CPAP at night. *  COPD *  Diabetes mellitus *  UTI, klebsiella.  *  Normocytic anemia.  Takes po Iron chronically.   PLAN: *  Would obtain skin care consult and begin some sort of perineal skin care as per  their recommendation. *  No colonoscopy. She is too many respiratory, conditioning issues and is neutropenic as well.  All of these are impediments to sedation. *  If diarrhea continues to be a problem, consider stopping the daily oral magnesium.  *  Add Anusol cream for 3 days.     LOS: 3 days   Jennye Moccasin  09/11/2012, 12:26 PM Pager: [phone]  I have reviewed the above note, examined the patient and agree with plan of treatment.Her rectal exam  Shows normal anal/digital exam and heme negative stool. The diarrhea seemed to have subsided. Continue Anusol HC cream. Please call prn, will sign off.  [facility] Pager # [phone]

## 2012-09-11 NOTE — Consult Note (Signed)
WOC consult Note Reason for Consult:denuded areas in perineum, no frank bleeding Wound type:moisture associated dermatitis Pressure Ulcer POA: No Wound OZH:YQMVH, pink Drainage (amount, consistency, odor) no frank bleeding at this time Periwound:mild erythema Dressing procedure/placement/frequency:Recommend cleansing with tepid tap water, patting gently dry.  Cover reddened and denuded tissue with protective barrier cream.  Reapply as needed for comfort/reepithelialization. I will not follow.  Please re-consult if needed. Thanks, Ladona Mow, MSN, RN, [facility], CWOCN [phone])

## 2012-09-11 NOTE — Progress Notes (Addendum)
Pt noted with bloody stool. MD notified. Will order GI consult and repeat CBC

## 2012-09-12 DIAGNOSIS — E876 Hypokalemia: Secondary | ICD-10-CM

## 2012-09-12 LAB — CBC
HCT: 23.6 % — ABNORMAL LOW (ref 36.0–46.0)
Hemoglobin: 8.1 g/dL — ABNORMAL LOW (ref 12.0–15.0)
MCH: 31.9 pg (ref 26.0–34.0)
MCHC: 34.3 g/dL (ref 30.0–36.0)
MCV: 92.9 fL (ref 78.0–100.0)
RDW: 14 % (ref 11.5–15.5)

## 2012-09-12 LAB — BASIC METABOLIC PANEL
BUN: 5 mg/dL — ABNORMAL LOW (ref 6–23)
Chloride: 105 mEq/L (ref 96–112)
Creatinine, Ser: 0.73 mg/dL (ref 0.50–1.10)
GFR calc Af Amer: >90 mL/min (ref >90)
Glucose, Bld: 101 mg/dL — ABNORMAL HIGH (ref 70–99)

## 2012-09-12 LAB — GLUCOSE, CAPILLARY
Glucose-Capillary: 100 mg/dL — ABNORMAL HIGH (ref 70–99)
Glucose-Capillary: 91 mg/dL (ref 70–99)

## 2012-09-12 LAB — ABO/RH: ABO/RH(D): B POS

## 2012-09-12 MED ORDER — MEGESTROL ACETATE 400 MG/10ML PO SUSP
400.0000 mg | Freq: Two times a day (BID) | ORAL | Status: DC
  Administered 2012-09-12: 400 mg via ORAL
  Filled 2012-09-12 (×3): qty 10

## 2012-09-12 NOTE — Progress Notes (Addendum)
ANTIBIOTIC CONSULT NOTE - FOLLOW UP  Pharmacy Consult for Vancomycin, Zosyn Indication: rule out pneumonia  No Known Allergies  Patient Measurements: Height: 5\' 5"  (165.1 cm) Weight: 176 lb 5.9 oz (80 kg) IBW/kg (Calculated) : 57   Vital Signs: Temp: 97.8 F (36.6 C) (10/26 0630) Temp src: Oral (10/26 0630) BP: 95/61 mmHg (10/26 0630) Pulse Rate: 67  (10/26 0630)  Labs:  Basename 09/12/12 0447 09/11/12 1000 09/11/12 0415 09/10/12 0435  WBC 3.1* 2.7* 2.3* --  HGB 8.1* 9.0* 8.1* --  PLT 74* 96* 85* --  LABCREA -- -- -- --  CREATININE 0.73 -- 0.78 0.76   Estimated Creatinine Clearance: 70.3 ml/min (by C-G formula based on Cr of 0.73). No results found for this basename: VANCOTROUGH:2,VANCOPEAK:2,VANCORANDOM:2,GENTTROUGH:2,GENTPEAK:2,GENTRANDOM:2,TOBRATROUGH:2,TOBRAPEAK:2,TOBRARND:2,AMIKACINPEAK:2,AMIKACINTROU:2,AMIKACIN:2, in the last 72 hours   Microbiology: 10/23 blood x 2 >> ngtd  10/23 urine >> 35K kleb pneumo (R- ampicillin, S- cefazolin, ceftriaxone, cipro, levaquin, zosyn, septra) 10/23 c.diff pcr >> negative   Assessment:  69 YOF with h/o metastatic lung cancer s/p chemo (last on 09/03/12), wedge resection, and COPD.  Day #4 vancomycin and zosyn for probable HCAP and K.pneumoniae UTI in setting of an immunocompromised patient.  Sensitivities for UTI available for narrowing of abx but continuing empiric broad spectrum abx for probable PNA.  CrCl ~ 70 ml/min (stable), afebrile, WBC = 3.1 (improved following Neupogen).  Goal of Therapy:  Vancomycin trough level 15-20 mcg/ml Zosyn per renal function  Plan:   Continue Zosyn 3.375g IV Q8H infused over 4hrs and Vancomycin 1g IV q12h.  Measure vancomycin trough this afternoon.  Clance Boll, PharmD, BCPS Pager: [phone] 09/12/2012 9:45 AM     Addendum: 09/12/2012 4:02 PM Vancomycin trough: 18.6 Plan: Continue vancomycin 1g IV q12h since vanc trough at goal.  Clance Boll, PharmD, BCPS Pager:  [phone] 09/12/2012 4:04 PM

## 2012-09-12 NOTE — Progress Notes (Signed)
Checked with patient Veronica Duarte for nocturnal CPAP placement. She refuses and states she will notify RN or RT when she is ready for placement. She remains on supplemental O2 via nasal canula and in no obvious distress. Denies SOB or dyspnea.

## 2012-09-12 NOTE — Progress Notes (Signed)
[facility] PROGRESS NOTE  NORALYN KARIM WUJ:[id number] DOB: 1946/07/30 DOA: 09/08/2012 PCP: [name], MD  Assessment/Plan:  #1 fall Likely secondary to mechanical fall secondary to dehydration, urinary tract infection, plus or minus pneumonia. CT of the head was negative. Urinalysis consistent with a UTI. Urine cultures 35,000 colonies of Klebsiella pneumonia . Repeat chest x-ray this morning was concerning for right sided pneumonia. Continue empiric IV Zosyn. D/c VANCOMYCIN.  PT/OT  #2 pneumonia Patient does have a history of metastatic lung cancer status post chemotherapy recently. Patient has a neutropenia with a white count of 1.0 which is improving.  Patient also presented with confusion. Will treat patient's pneumonia is a healthcare associated pneumonia secondary to immunocompromised state. Continue Zosyn. D/C IV vancomycin.  follow.  #3 rectal bleeding GI has been consulted. A GI likely feels perirectal and perineal bleeding. They recommended skin care consult and perineal skin care. Anusol has been added to patient's regimen. Patient with an episode of rectal bleeding yesterday night. Dr. Loreta Ave was performed and is following along. Follow.  #4 urinary tract infection Urine cultures and 35,000 colonies of Klebsiella pneumonia.  Continue IV Zosyn  #5 acute encephalopathy Likely secondary to urinary tract infection and probable pneumonia. Slowly improving clinically. Continue IV fluids. Continue empiric IV Zosyn. D/C IV Vancomycin. Follow. If worsens may consider MRI of the head to rule out metastatic disease.  #6 history of bilateral PE and DVT Continue Arixtra. Monitor closely with rectal bleeding.  #7 history of metastatic lung cancer Per oncology. Patient's oncologist is aware of patient's admission.  #8 right hip pain Plain films of the right hip are negative. Continue pain management.  #9 diarrhea C. difficile PCR is negative. Imodium PRN. Follow.  #10  neutropenia/anemia Likely secondary to recent chemotherapy therapy in the setting of urinary tract infection and possible pneumonia. Blood cultures pending. Chest x-ray consistent with a pneumonia. Urine cultures with 35,000 colonies of Klebsiella pneumonia. Anemia panel pending. WBC improved after a dose of Neupogen.Continue empiric IV Zosyn. D/C IV vancomycin. Follow. Will also place on neutropenic precautions. Discussed this with patient's oncologist.  #11 COPD Stable. Nebs as needed. Oxygen. Continue empiric antibiotics for problem #2.  #12 obstructive sleep apnea CPAP each bedtime.  #13 hypokalemia Repleted.Start on daily potasium.  #14 prophylaxis On Arixtra for DVT prophylaxis.      Code Status: full Family Communication: Updated patient. No family at bedside. Disposition Plan: SNF when medically stable   Consultants:  Gastroenterology: Dr. Juanda Chance 09/11/2012  Procedures:  CT head 09/09/2012  Chest x-ray 09/09/2012  Plain films of the right hip 09/09/2012  Antibiotics: IV Zosyn 09/08/2012 IV vancomycin 09/09/2012  HPI/Subjective:  Patient states is feeling better. Per nursing patient with some rectal bleeding last night. Patient alert to self, place and year.  Objective: Filed Vitals:   09/11/12 2019 09/11/12 2200 09/12/12 0630 09/12/12 0921  BP:  98/61 95/61   Pulse:  70 67   Temp:  98.8 F (37.1 C) 97.8 F (36.6 C)   TempSrc:  Oral Oral   Resp:  18 18   Height:      Weight:      SpO2: 98% 94% 97% 97%    Intake/Output Summary (Last 24 hours) at 09/12/12 1546 Last data filed at 09/12/12 0900  Gross per 24 hour  Intake 2344.17 ml  Output    650 ml  Net 1694.17 ml   Filed Weights   09/08/12 2300  Weight: 80 kg (176 lb 5.9 oz)  Exam:   General:  NAD  Cardiovascular: RRR  Respiratory: CTAB  Abdomen: SOFT/nd/nd/+ bs  Data Reviewed: Basic Metabolic Panel:  Lab 09/12/12 1610 09/11/12 1410 09/11/12 0415 09/10/12 0435 09/09/12 0514  09/08/12 1645  NA 136 -- 133* 133* 130* 132*  K 4.4 3.6 2.7* 3.6 2.8* --  CL 105 -- 100 100 96 95*  CO2 20 -- 21 21 24 26   GLUCOSE 101* -- 87 102* 90 108*  BUN 5* -- 7 8 11 18   CREATININE 0.73 -- 0.78 0.76 0.72 0.83  CALCIUM 8.8 -- 8.7 8.8 8.6 9.1  MG 1.8 -- 1.8 1.7 1.5 --  PHOS -- -- -- -- -- --   Liver Function Tests:  Lab 09/08/12 1645  AST 22  ALT 13  ALKPHOS 51  BILITOT 0.6  PROT 6.0  ALBUMIN 2.9*   No results found for this basename: LIPASE:5,AMYLASE:5 in the last 168 hours No results found for this basename: AMMONIA:5 in the last 168 hours CBC:  Lab 09/12/12 0447 09/11/12 1000 09/11/12 0415 09/10/12 0435 09/09/12 0514 09/08/12 1645  WBC 3.1* 2.7* 2.3* 1.5* 1.0* --  NEUTROABS -- -- 1.7 1.1* -- 0.8*  HGB 8.1* 9.0* 8.1* 8.6* 8.8* --  HCT 23.6* 26.0* 22.9* 25.6* 25.7* --  MCV 92.9 91.5 91.2 92.4 92.8 --  PLT 74* 96* 85* 134* 165 --   Cardiac Enzymes: No results found for this basename: CKTOTAL:5,CKMB:5,CKMBINDEX:5,TROPONINI:5 in the last 168 hours BNP (last 3 results) No results found for this basename: PROBNP:3 in the last 8760 hours CBG:  Lab 09/12/12 1204 09/12/12 0741 09/11/12 2135 09/11/12 1629 09/11/12 1200  GLUCAP 83 83 118* 100* 102*    Recent Results (from the past 240 hour(s))  URINE CULTURE     Status: Normal   Collection Time   09/08/12  5:15 PM      Component Value Range Status Comment   Specimen Description URINE, CLEAN CATCH   Final    Special Requests NONE   Final    Culture  Setup Time 09/09/2012 03:16   Final    Colony Count 35,000 COLONIES/ML   Final    Culture KLEBSIELLA PNEUMONIAE   Final    Report Status 09/10/2012 FINAL   Final    Organism ID, Bacteria KLEBSIELLA PNEUMONIAE   Final   CLOSTRIDIUM DIFFICILE BY PCR     Status: Normal   Collection Time   09/09/12  1:15 AM      Component Value Range Status Comment   C difficile by pcr NEGATIVE  NEGATIVE Final   CULTURE, BLOOD (ROUTINE X 2)     Status: Normal (Preliminary result)    Collection Time   09/09/12  4:00 PM      Component Value Range Status Comment   Specimen Description BLOOD RIGHT HAND   Final    Special Requests BOTTLES DRAWN AEROBIC AND ANAEROBIC 10CC   Final    Culture  Setup Time 09/09/2012 23:20   Final    Culture     Final    Value:        BLOOD CULTURE RECEIVED NO GROWTH TO DATE CULTURE WILL BE HELD FOR 5 DAYS BEFORE ISSUING A FINAL NEGATIVE REPORT   Report Status PENDING   Incomplete   CULTURE, BLOOD (ROUTINE X 2)     Status: Normal (Preliminary result)   Collection Time   09/09/12  4:20 PM      Component Value Range Status Comment   Specimen Description BLOOD RIGHT ARM  Final    Special Requests BOTTLES DRAWN AEROBIC AND ANAEROBIC 10CC   Final    Culture  Setup Time 09/09/2012 23:20   Final    Culture     Final    Value:        BLOOD CULTURE RECEIVED NO GROWTH TO DATE CULTURE WILL BE HELD FOR 5 DAYS BEFORE ISSUING A FINAL NEGATIVE REPORT   Report Status PENDING   Incomplete      Studies: No results found.  Scheduled Meds:    . atorvastatin  20 mg Oral q1800  . budesonide-formoterol  2 puff Inhalation BID  . buPROPion  150 mg Oral Daily  . diltiazem  120 mg Oral Daily  . DULoxetine  60 mg Oral Daily  . ezetimibe  10 mg Oral Daily  . feeding supplement  237 mL Oral BID BM  . ferrous sulfate  325 mg Oral Q breakfast  . folic acid  1 mg Oral Daily  . fondaparinux  7.5 mg Subcutaneous QHS  . hydrocortisone   Rectal BID  . insulin aspart  0-9 Units Subcutaneous TID WC  . magnesium oxide  400 mg Oral QHS  . magnesium oxide  800 mg Oral Daily  . megestrol  400 mg Oral BID  . memantine  10 mg Oral Daily  . ondansetron (ZOFRAN) IV  4 mg Intravenous Once  . piperacillin-tazobactam (ZOSYN)  IV  3.375 g Intravenous Q8H  . potassium chloride  40 mEq Oral Q4H  . potassium chloride  40 mEq Oral Daily  . tiotropium  18 mcg Inhalation Daily  . vancomycin  1,000 mg Intravenous Q12H   Continuous Infusions:    . sodium chloride 50 mL/hr at  09/12/12 1315    Principal Problem:  *Fall Active Problems:  Type II or unspecified type diabetes mellitus with renal manifestations, uncontrolled(250.42)  HYPERLIPIDEMIA  DEMENTIA  DEPRESSION  HYPERTENSION  COPD  ADENOCARCINOMA, RIGHT LUNG, UPPER LOBE  CAD (coronary artery disease)  PE (pulmonary embolism)  UTI (lower urinary tract infection)  Neutropenia  Encephalopathy  Dehydration  PNA (pneumonia)  Hypokalemia  Anemia  Rectal bleeding    Time spent: > 35 mins    [facility]  [facility] Pager [phone]. If 8PM-8AM, please contact night-coverage at [email or web address], password [facility] 09/12/2012, 3:46 PM  LOS: 4 days

## 2012-09-12 NOTE — Progress Notes (Signed)
Subjective:Cross cover [facility] I received a call from the patient's nurse this morning that she had some more rectal bleeding with 40 cc's of blood in the stool. Patient claims she does not know she had the blood in the stool. She claims "my brain does not work properly now". Seems confused. Denies having any abdominal pain,nuasea or vomiting. Has a fairly good appetite. The bleeding was thought to be from perirectal excoriations as per  Dr. Regino Schultze notes.   Objective: Vital signs in last 24 hours: Temp:  [97.8 F (36.6 C)-98.8 F (37.1 C)] 97.8 F (36.6 C) (10/26 0630) Pulse Rate:  [67-94] 67  (10/26 0630) Resp:  [18-20] 18  (10/26 0630) BP: (93-98)/(61) 95/61 mmHg (10/26 0630) SpO2:  [94 %-99 %] 97 % (10/26 0921) Last BM Date: 09/09/12  Intake/Output from previous day: 10/25 0701 - 10/26 0700 In: 2564.2 [P.O.:340; I.V.:1674.2; IV Piggyback:550] Out: 650 [Urine:650] Intake/Output this shift: Total I/O In: 120 [P.O.:120] Out: -   General appearance: cooperative, somewhat confused, appears stated age and fatigued Resp: clear to auscultation bilaterally Cardio: regular rate and rhythm, S1, S2 normal, no murmur, click, rub or gallop GI: soft, non-tender; bowel sounds normal; no masses,  no organomegaly Extremities: extremities normal, atraumatic, no cyanosis or edema  Lab Results:  Basename 09/12/12 0447 09/11/12 1000 09/11/12 0415  WBC 3.1* 2.7* 2.3*  HGB 8.1* 9.0* 8.1*  HCT 23.6* 26.0* 22.9*  PLT 74* 96* 85*   BMET  Basename 09/12/12 0447 09/11/12 1410 09/11/12 0415 09/10/12 0435  NA 136 -- 133* 133*  K 4.4 3.6 2.7* --  CL 105 -- 100 100  CO2 20 -- 21 21  GLUCOSE 101* -- 87 102*  BUN 5* -- 7 8  CREATININE 0.73 -- 0.78 0.76  CALCIUM 8.8 -- 8.7 8.8   Medications: I have reviewed the patient's current medications.  Assessment/Plan: 1) Anemia/Thrombocyotopenia/Rectal bleeding with about 1 gm drop in hemoglobin/hypotension-off of her regular antihypertensives: as  discussed with Dr. Janee Morn, I will monitor the patient today and hold off on a colonoscopy due to her other co-morbidities. I she continues to bleed,  We will prep her for a colonoscopy. 2) COPD/Adenocarcinoma of the right lung; upper lobe s/p chemotherapy; WBC 3.1K.  3) UTI on BSA.  4) History of bilateral PE's 7/13. 5) HTN/Hyperlipidemia/CAD.  LOS: 4 days   Calandra Madura 09/12/2012, 11:21 AM

## 2012-09-12 NOTE — Progress Notes (Signed)
Moderate amt bloody stool at 0630 VSS afebrile [place]auer on call notified awaiting call back

## 2012-09-13 LAB — BASIC METABOLIC PANEL
CO2: 21 mEq/L (ref 19–32)
Chloride: 105 mEq/L (ref 96–112)
GFR calc non Af Amer: 84 mL/min — ABNORMAL LOW (ref >90)
Glucose, Bld: 88 mg/dL (ref 70–99)
Potassium: 4.2 mEq/L (ref 3.5–5.1)
Sodium: 139 mEq/L (ref 135–145)

## 2012-09-13 LAB — CBC
HCT: 26.1 % — ABNORMAL LOW (ref 36.0–46.0)
Hemoglobin: 8.5 g/dL — ABNORMAL LOW (ref 12.0–15.0)
MCH: 30.8 pg (ref 26.0–34.0)
RBC: 2.76 MIL/uL — ABNORMAL LOW (ref 3.87–5.11)

## 2012-09-13 LAB — MAGNESIUM: Magnesium: 1.6 mg/dL (ref 1.5–2.5)

## 2012-09-13 LAB — GLUCOSE, CAPILLARY
Glucose-Capillary: 109 mg/dL — ABNORMAL HIGH (ref 70–99)
Glucose-Capillary: 84 mg/dL (ref 70–99)

## 2012-09-13 MED ORDER — MEGESTROL ACETATE 400 MG/10ML PO SUSP
400.0000 mg | Freq: Every day | ORAL | Status: DC
  Administered 2012-09-13 – 2012-09-17 (×5): 400 mg via ORAL
  Filled 2012-09-13 (×5): qty 10

## 2012-09-13 NOTE — Progress Notes (Signed)
Occupational Therapy Treatment Patient Details Name: Veronica Duarte MRN: [id number] DOB: Oct 14, 1946 Today's Date: 09/13/2012 Time: 4098-1191 OT Time Calculation (min): 24 min  OT Assessment / Plan / Recommendation Comments on Treatment Session Pt plans to go home with her daughter who is a Engineer, civil (consulting), but lives in a home of 2 levels with bedrooms on the second floor.  Given pt's current endurance, unlikely she will be able to climb stairs.  Suggested pt consider ST rehab in SNF.  Will continue to follow.    Follow Up Recommendations  Home health OT;Supervision/Assistance - 24 hour    Barriers to Discharge       Equipment Recommendations  3 in 1 bedside comode    Recommendations for Other Services    Frequency Min 2X/week   Plan Discharge plan remains appropriate    Precautions / Restrictions Precautions Precautions: Fall Restrictions Weight Bearing Restrictions: No   Pertinent Vitals/Pain No c/o pain.    ADL  Grooming: Performed;Set up Where Assessed - Grooming: Unsupported sitting Upper Body Dressing: Performed;Minimal assistance Where Assessed - Upper Body Dressing: Unsupported sitting Toilet Transfer: Performed;Minimal assistance Toilet Transfer Method: Sit to stand Toilet Transfer Equipment: Bedside commode Toileting - Clothing Manipulation and Hygiene: Simulated;Minimal assistance;Other (comment) Where Assessed - Toileting Clothing Manipulation and Hygiene: Sit to stand from 3-in-1 or toilet Equipment Used: Rolling walker Transfers/Ambulation Related to ADLs: On 2L 02 throughout session.  Did not ambulate to BR due to DOE. ADL Comments: Instructed in pacing, purse lip breathing with exertion.  Instructed in and gave pt handout with energy conservation strategies.    OT Diagnosis:    OT Problem List:   OT Treatment Interventions:     OT Goals ADL Goals Pt Will Perform Grooming: with supervision;Standing at sink;Sitting at sink ADL Goal: Grooming - Progress: Not  progressing Pt Will Perform Lower Body Bathing: with supervision;Sit to stand from bed;Sit to stand from chair Pt Will Perform Lower Body Dressing: with supervision;Sit to stand from bed;Sit to stand from chair Pt Will Transfer to Toilet: with supervision;3-in-1;Comfort height toilet;Regular height toilet;Ambulation ADL Goal: Toilet Transfer - Progress: Not progressing Pt Will Perform Toileting - Clothing Manipulation: with supervision;Sitting on 3-in-1 or toilet;Standing ADL Goal: Toileting - Clothing Manipulation - Progress: Not progressing Pt Will Perform Toileting - Hygiene: with supervision;Sit to stand from 3-in-1/toilet ADL Goal: Toileting - Hygiene - Progress: Progressing toward goals Miscellaneous OT Goals Miscellaneous OT Goal #1: Pt will verbalize and incorporate at least 3 energy conservation strategies into selfcare routine without prompting. OT Goal: Miscellaneous Goal #1 - Progress: Progressing toward goals  Visit Information  Last OT Received On: 09/13/12 Assistance Needed: +1    Subjective Data      Prior Functioning       Cognition  Overall Cognitive Status: Appears within functional limits for tasks assessed/performed Arousal/Alertness: Awake/alert Orientation Level: Appears intact for tasks assessed Behavior During Session: [name] for tasks performed    Mobility  Shoulder Instructions Bed Mobility Bed Mobility: Supine to Sit;Sit to Supine Supine to Sit: HOB elevated;With rails;5: Supervision Sit to Supine: 4: Min assist;HOB elevated Transfers Transfers: Sit to Stand;Stand to Sit Sit to Stand: 4: Min assist;From bed;From toilet Stand to Sit: 4: Min assist;To toilet;To bed Details for Transfer Assistance: Pt very shaky throughout transfer.       Exercises      Balance     End of Session OT - End of Session Activity Tolerance: Patient limited by fatigue Patient left: in bed;with call bell/phone within reach;with  family/visitor present  GO      Evern Bio 09/13/2012, 11:59 AM [phone]

## 2012-09-13 NOTE — Progress Notes (Signed)
Subjective: Cross cover [facility] Since I last evaluated the patient, she has not had any more rectal bleeding. Denies any abdominal pain, nausea or vomiting.  Objective: Vital signs in last 24 hours: Temp:  [98.1 F (36.7 C)-98.2 F (36.8 C)] 98.2 F (36.8 C) (10/27 0607) Pulse Rate:  [63-77] 63  (10/27 0607) Resp:  [16-18] 18  (10/27 0607) BP: (91-122)/(52-70) 122/70 mmHg (10/27 0607) SpO2:  [95 %-99 %] 97 % (10/27 0908) Last BM Date: 09/12/12  Intake/Output from previous day: 10/26 0701 - 10/27 0700 In: 893.3 [P.O.:240; I.V.:603.3; IV Piggyback:50] Out: 300 [Urine:300] Intake/Output this shift: Total I/O In: -  Out: 250 [Urine:250]  No change in PE since yesterday  Lab Results:  Basename 09/13/12 0315 09/12/12 0447 09/11/12 1000  WBC 4.9 3.1* 2.7*  HGB 8.5* 8.1* 9.0*  HCT 26.1* 23.6* 26.0*  PLT 74* 74* 96*   BMET  Basename 09/13/12 0315 09/12/12 0447 09/11/12 1410 09/11/12 0415  NA 139 136 -- 133*  K 4.2 4.4 3.6 --  CL 105 105 -- 100  CO2 21 20 -- 21  GLUCOSE 88 101* -- 87  BUN 4* 5* -- 7  CREATININE 0.79 0.73 -- 0.78  CALCIUM 9.4 8.8 -- 8.7   Medications: I have reviewed the patient's current medications.  Assessment/Plan: Perianal excoriations-causing rectal bleeding; seems to be improving with local steroid application.  LOS: 5 days   Veronica Duarte 09/13/2012, 3:44 PM

## 2012-09-13 NOTE — Progress Notes (Signed)
[facility] PROGRESS NOTE  Veronica Duarte ZOX:[id number] DOB: Feb 25, 1946 DOA: 09/08/2012 PCP: [name], MD  Assessment/Plan:  #1 fall Likely secondary to mechanical fall secondary to dehydration, urinary tract infection, plus or minus pneumonia. CT of the head was negative. Urinalysis consistent with a UTI. Urine cultures 35,000 colonies of Klebsiella pneumonia . Repeat chest x-ray this morning was concerning for right sided pneumonia. Continue empiric IV Zosyn.  PT/OT  #2 pneumonia Patient does have a history of metastatic lung cancer status post chemotherapy recently. Patient had neutropenia with a white count of 1.0 which is improving and now 4.9.  Patient also presented with confusion. Will treat patient's pneumonia is a healthcare associated pneumonia secondary to immunocompromised state. Continue Zosyn.  follow.  #3 rectal bleeding GI has been consulted. A GI likely feels perirectal and perineal bleeding. They recommended skin care consult and perineal skin care. Anusol has been added to patient's regimen. Patient with an episode of rectal bleeding 2 nights ago. Dr. Loreta Ave was informed and is following along. Follow.  #4 urinary tract infection Urine cultures and 35,000 colonies of Klebsiella pneumonia.  Continue IV Zosyn  #5 acute encephalopathy Likely secondary to urinary tract infection and probable pneumonia. Slowly improving clinically. Continue IV fluids. Continue empiric IV Zosyn. Follow. If worsens may consider MRI of the head to rule out metastatic disease.  #6 history of bilateral PE and DVT Continue Arixtra. Monitor closely with rectal bleeding.  #7 history of metastatic lung cancer Per oncology. Patient's oncologist is aware of patient's admission.  #8 right hip pain Plain films of the right hip are negative. Continue pain management.  #9 diarrhea C. difficile PCR is negative. Imodium PRN. Follow.  #10 neutropenia/anemia Likely secondary to recent  chemotherapy therapy in the setting of urinary tract infection and possible pneumonia. Blood cultures pending. Chest x-ray consistent with a pneumonia. Urine cultures with 35,000 colonies of Klebsiella pneumonia.  WBC improved after a dose of Neupogen.Continue empiric IV Zosyn. Follow.  Discussed this with patient's oncologist.  #11 COPD Stable. Nebs as needed. Oxygen. Continue empiric antibiotics for problem #2.  #12 obstructive sleep apnea CPAP each bedtime.  #13 hypokalemia Repleted.Start on daily potasium.  #14 prophylaxis On Arixtra for DVT prophylaxis.      Code Status: full Family Communication: Updated patient. No family at bedside. Disposition Plan: SNF when medically stable   Consultants:  Gastroenterology: Dr. Juanda Chance 09/11/2012  Procedures:  CT head 09/09/2012  Chest x-ray 09/09/2012  Plain films of the right hip 09/09/2012  Antibiotics: IV Zosyn 09/08/2012 IV vancomycin 09/09/2012---->09/12/12  HPI/Subjective:  Patient eating and denies any rectal bleeding.  Objective: Filed Vitals:   09/12/12 1606 09/12/12 2248 09/13/12 0607 09/13/12 0908  BP: 91/52 118/70 122/70   Pulse: 67 77 63   Temp: 98.1 F (36.7 C) 98.1 F (36.7 C) 98.2 F (36.8 C)   TempSrc: Oral Oral Oral   Resp: 18 16 18    Height:      Weight:      SpO2: 97% 95% 99% 97%    Intake/Output Summary (Last 24 hours) at 09/13/12 0942 Last data filed at 09/13/12 8119  Gross per 24 hour  Intake 773.33 ml  Output    300 ml  Net 473.33 ml   Filed Weights   09/08/12 2300  Weight: 80 kg (176 lb 5.9 oz)    Exam:   General:  NAD  Cardiovascular: RRR  Respiratory: CTAB  Abdomen: SOFT/nd/nd/+ bs  Data Reviewed: Basic Metabolic Panel:  Lab 09/13/12  1191 09/12/12 0447 09/11/12 1410 09/11/12 0415 09/10/12 0435 09/09/12 0514  NA 139 136 -- 133* 133* 130*  K 4.2 4.4 3.6 2.7* 3.6 --  CL 105 105 -- 100 100 96  CO2 21 20 -- 21 21 24   GLUCOSE 88 101* -- 87 102* 90  BUN 4* 5* -- 7 8  11   CREATININE 0.79 0.73 -- 0.78 0.76 0.72  CALCIUM 9.4 8.8 -- 8.7 8.8 8.6  MG 1.6 1.8 -- 1.8 1.7 1.5  PHOS -- -- -- -- -- --   Liver Function Tests:  Lab 09/08/12 1645  AST 22  ALT 13  ALKPHOS 51  BILITOT 0.6  PROT 6.0  ALBUMIN 2.9*   No results found for this basename: LIPASE:5,AMYLASE:5 in the last 168 hours No results found for this basename: AMMONIA:5 in the last 168 hours CBC:  Lab 09/13/12 0315 09/12/12 0447 09/11/12 1000 09/11/12 0415 09/10/12 0435 09/08/12 1645  WBC 4.9 3.1* 2.7* 2.3* 1.5* --  NEUTROABS -- -- -- 1.7 1.1* 0.8*  HGB 8.5* 8.1* 9.0* 8.1* 8.6* --  HCT 26.1* 23.6* 26.0* 22.9* 25.6* --  MCV 94.6 92.9 91.5 91.2 92.4 --  PLT 74* 74* 96* 85* 134* --   Cardiac Enzymes: No results found for this basename: CKTOTAL:5,CKMB:5,CKMBINDEX:5,TROPONINI:5 in the last 168 hours BNP (last 3 results) No results found for this basename: PROBNP:3 in the last 8760 hours CBG:  Lab 09/13/12 0720 09/12/12 2245 09/12/12 1631 09/12/12 1204 09/12/12 0741  GLUCAP 87 91 95 83 83    Recent Results (from the past 240 hour(s))  URINE CULTURE     Status: Normal   Collection Time   09/08/12  5:15 PM      Component Value Range Status Comment   Specimen Description URINE, CLEAN CATCH   Final    Special Requests NONE   Final    Culture  Setup Time 09/09/2012 03:16   Final    Colony Count 35,000 COLONIES/ML   Final    Culture KLEBSIELLA PNEUMONIAE   Final    Report Status 09/10/2012 FINAL   Final    Organism ID, Bacteria KLEBSIELLA PNEUMONIAE   Final   CLOSTRIDIUM DIFFICILE BY PCR     Status: Normal   Collection Time   09/09/12  1:15 AM      Component Value Range Status Comment   C difficile by pcr NEGATIVE  NEGATIVE Final   CULTURE, BLOOD (ROUTINE X 2)     Status: Normal (Preliminary result)   Collection Time   09/09/12  4:00 PM      Component Value Range Status Comment   Specimen Description BLOOD RIGHT HAND   Final    Special Requests BOTTLES DRAWN AEROBIC AND ANAEROBIC  10CC   Final    Culture  Setup Time 09/09/2012 23:20   Final    Culture     Final    Value:        BLOOD CULTURE RECEIVED NO GROWTH TO DATE CULTURE WILL BE HELD FOR 5 DAYS BEFORE ISSUING A FINAL NEGATIVE REPORT   Report Status PENDING   Incomplete   CULTURE, BLOOD (ROUTINE X 2)     Status: Normal (Preliminary result)   Collection Time   09/09/12  4:20 PM      Component Value Range Status Comment   Specimen Description BLOOD RIGHT ARM   Final    Special Requests BOTTLES DRAWN AEROBIC AND ANAEROBIC 10CC   Final    Culture  Setup Time 09/09/2012 23:20  Final    Culture     Final    Value:        BLOOD CULTURE RECEIVED NO GROWTH TO DATE CULTURE WILL BE HELD FOR 5 DAYS BEFORE ISSUING A FINAL NEGATIVE REPORT   Report Status PENDING   Incomplete      Studies: No results found.  Scheduled Meds:    . atorvastatin  20 mg Oral q1800  . budesonide-formoterol  2 puff Inhalation BID  . buPROPion  150 mg Oral Daily  . diltiazem  120 mg Oral Daily  . DULoxetine  60 mg Oral Daily  . ezetimibe  10 mg Oral Daily  . feeding supplement  237 mL Oral BID BM  . ferrous sulfate  325 mg Oral Q breakfast  . folic acid  1 mg Oral Daily  . fondaparinux  7.5 mg Subcutaneous QHS  . hydrocortisone   Rectal BID  . insulin aspart  0-9 Units Subcutaneous TID WC  . magnesium oxide  400 mg Oral QHS  . magnesium oxide  800 mg Oral Daily  . megestrol  400 mg Oral Daily  . memantine  10 mg Oral Daily  . ondansetron (ZOFRAN) IV  4 mg Intravenous Once  . piperacillin-tazobactam (ZOSYN)  IV  3.375 g Intravenous Q8H  . potassium chloride  40 mEq Oral Daily  . tiotropium  18 mcg Inhalation Daily  . DISCONTD: megestrol  400 mg Oral BID  . DISCONTD: vancomycin  1,000 mg Intravenous Q12H   Continuous Infusions:    . sodium chloride 50 mL/hr at 09/12/12 1315    Principal Problem:  *Fall Active Problems:  Type II or unspecified type diabetes mellitus with renal manifestations, uncontrolled(250.42)   HYPERLIPIDEMIA  DEMENTIA  DEPRESSION  HYPERTENSION  COPD  ADENOCARCINOMA, RIGHT LUNG, UPPER LOBE  CAD (coronary artery disease)  PE (pulmonary embolism)  UTI (lower urinary tract infection)  Neutropenia  Encephalopathy  Dehydration  PNA (pneumonia)  Hypokalemia  Anemia  Rectal bleeding    Time spent: > 35 mins    [facility]  [facility] Pager [phone]. If 8PM-8AM, please contact night-coverage at [email or web address], password [facility] 09/13/2012, 9:42 AM  LOS: 5 days

## 2012-09-14 DIAGNOSIS — D61818 Other pancytopenia: Secondary | ICD-10-CM

## 2012-09-14 LAB — CBC
MCH: 31.7 pg (ref 26.0–34.0)
MCV: 93.7 fL (ref 78.0–100.0)
Platelets: 60 10*3/uL — ABNORMAL LOW (ref 150–400)
RDW: 13.7 % (ref 11.5–15.5)
WBC: 2.9 10*3/uL — ABNORMAL LOW (ref 4.0–10.5)

## 2012-09-14 LAB — GLUCOSE, CAPILLARY
Glucose-Capillary: 81 mg/dL (ref 70–99)
Glucose-Capillary: 79 mg/dL (ref 70–99)
Glucose-Capillary: 79 mg/dL (ref 70–99)
Glucose-Capillary: 81 mg/dL (ref 70–99)

## 2012-09-14 LAB — BASIC METABOLIC PANEL
Calcium: 9 mg/dL (ref 8.4–10.5)
Creatinine, Ser: 0.7 mg/dL (ref 0.50–1.10)
GFR calc Af Amer: >90 mL/min (ref >90)

## 2012-09-14 MED ORDER — LEVOFLOXACIN 750 MG PO TABS
750.0000 mg | ORAL_TABLET | Freq: Every day | ORAL | Status: DC
  Administered 2012-09-14 – 2012-09-16 (×3): 750 mg via ORAL
  Filled 2012-09-14 (×3): qty 1

## 2012-09-14 NOTE — Progress Notes (Signed)
Physical Therapy Treatment Patient Details Name: Veronica Duarte MRN: [id number] DOB: May 05, 1946 Today's Date: 09/14/2012 Time: 1191-4782 PT Time Calculation (min): 22 min  PT Assessment / Plan / Recommendation Comments on Treatment Session  Pt able to tolerate increasing ambulation distance today however still present with increased DOE requiring seated rest break during ambulation.      Follow Up Recommendations  Home health PT;Supervision/Assistance - 24 hour;Other (comment) (SNF if 24/7 not available)     Does the patient have the potential to tolerate intense rehabilitation     Barriers to Discharge        Equipment Recommendations  3 in 1 bedside comode    Recommendations for Other Services    Frequency     Plan Discharge plan remains appropriate;Frequency remains appropriate    Precautions / Restrictions Precautions Precautions: Fall   Pertinent Vitals/Pain No pain    Mobility  Bed Mobility Bed Mobility: Supine to Sit;Sit to Supine Supine to Sit: HOB elevated;5: Supervision Sit to Supine: HOB elevated;5: Supervision Details for Bed Mobility Assistance: increased time Transfers Transfers: Stand to Sit;Sit to Stand Sit to Stand: 4: Min assist;From bed;From chair/3-in-1 Stand to Sit: 4: Min guard;To chair/3-in-1;To bed Details for Transfer Assistance: verbal cues for safe technique Ambulation/Gait Ambulation/Gait Assistance: 4: Min assist Ambulation Distance (Feet): 80 Feet (40x2) Assistive device: Rolling walker Ambulation/Gait Assistance Details: pt ambulated 40 feet then required seated rest break due to DOE, SaO2 93% upon sitting in recliner then increased to 95% prior to ambulating back to room Gait Pattern: Decreased stride length;Step-through pattern;Trunk flexed    Exercises     PT Diagnosis:    PT Problem List:   PT Treatment Interventions:     PT Goals Acute Rehab PT Goals PT Goal Formulation: With patient PT Goal: Supine/Side to Sit - Progress:  Progressing toward goal PT Goal: Sit to Supine/Side - Progress: Progressing toward goal PT Goal: Sit to Stand - Progress: Progressing toward goal PT Goal: Ambulate - Progress: Progressing toward goal  Visit Information  Last PT Received On: 09/14/12 Assistance Needed: +1    Subjective Data  Subjective: I know my legs could get this weak.   Cognition  Overall Cognitive Status: Appears within functional limits for tasks assessed/performed    Balance     End of Session PT - End of Session Activity Tolerance: Patient limited by fatigue Patient left: in bed;with call bell/phone within reach Nurse Communication: Other (comment) (RN aware pt in bed without alarm)   GP     Caydn Justen,KATHrine E 09/14/2012, 12:30 PM Pager: [phone]

## 2012-09-14 NOTE — Progress Notes (Signed)
Spoke with patient regarding cpap.  Pt states she has been placing herself on/off her cpap machine.  Offered to add sterile water for humidity, but pt refused at this time.  Pt advised that RT available all night and encouraged to call should she need assistance.  RN aware.

## 2012-09-14 NOTE — Progress Notes (Signed)
Subjective: Patient is a very pleasant 66 year old white female with metastatic non-small cell lung cancer, adenocarcinoma. She is status post 6 cycles of systemic chemotherapy with carboplatin and Alimta last given 07/23/2012. She is currently status post 2 cycles of maintenance Alimta at 500 mg per meter squared given every 3 weeks last given 09/03/2012. She was admitted after sustaining a fall when she got up to the bathroom. She was found to have urinary tract infection and is also currently being treated for pneumonia. She is currently being treated with IV Zosyn however per Dr. Janee Morn this will be changed to oral antibiotics today. Patient reports feeling well today. She specifically denied any pain or urinary symptomatology. She reports that she is eating a little bit better. She voiced no specific complaints today. Compared to previous visits with me her mental status is at baseline.  Objective: Vital signs in last 24 hours: Temp:  [97.6 F (36.4 C)-98.2 F (36.8 C)] 97.9 F (36.6 C) (10/28 1525) Pulse Rate:  [68-80] 80  (10/28 1525) Resp:  [18-20] 20  (10/28 1525) BP: (102-125)/(61-74) 102/61 mmHg (10/28 1525) SpO2:  [95 %-99 %] 99 % (10/28 1525)  Intake/Output from previous day: 10/27 0701 - 10/28 0700 In: -  Out: 250 [Urine:250] Intake/Output this shift:    General appearance: alert, cooperative, appears stated age, no distress and Patient's mental status/dementia is at baseline Resp: clear to auscultation bilaterally Cardio: regular rate and rhythm, S1, S2 normal, no murmur, click, rub or gallop GI: soft, non-tender; bowel sounds normal; no masses,  no organomegaly Extremities: edema Trace pitting edema bilateral lower extremities  Lab Results:   Basename 09/14/12 0450 09/13/12 0315  WBC 2.9* 4.9  HGB 8.0* 8.5*  HCT 23.6* 26.1*  PLT 60* 74*   BMET  Basename 09/14/12 0450 09/13/12 0315  NA 139 139  K 3.5 4.2  CL 104 105  CO2 22 21  GLUCOSE 74 88  BUN 3* 4*    CREATININE 0.70 0.79  CALCIUM 9.0 9.4    Studies/Results: No results found.  Medications: I have reviewed the patient's current medications.  Assessment/Plan: The patient is a pleasant 66 year old white female with metastatic non-small cell lung cancer, adenocarcinoma. She status post 6 cycles of systemic chemotherapy with carboplatin and Alimta and most recently status post 2 cycles of maintenance Alimta at 500 mg per meter squared given every 3 weeks, last given 09/03/2012. Have reviewed her CBC and the trending downwards her white count hemoglobin and platelets are making. She currently has no active bleeding with a platelet count of 60,000 down from 74,000 yesterday, hemoglobin of 8.0 g/dL down from 8.5 g/dL yesterday and white count of 2.9 down from 4.9 yesterday. Recommend checking in differential with the morning CBC. As long as the absolute neutrophil count is 1.0 or greater they would not be a further need for Neupogen at this time. Recommend closely monitoring her hemoglobin she may benefit from packed red blood cell transfusion should the hemoglobin drop below 8.0 g/dL. Her low counts in general may be multicentric oral including her recent chemotherapy as well as the current infections (UTI and pneumonia). Recommend switched to oral antibiotics most likely Levaquin as this would cover both the chest and a urinary tract infection we. Also agree with plans for skilled nursing facility placement. Continue Arixtra for her history of bilateral pulmonary emboli and left lower extremity deep vein thrombosis diagnosed 06/03/2012.   LOS: 6 days    Marlana Salvage 09/14/2012 4:36 PM  Hematology/oncology attending: The patient is seen and examined. I agree with the above note. She was admitted with urinary tract infection and neutropenia which improved after 1 dose of Neupogen. She is currently on treatment with IV Zosyn. The patient is slowly improving. I have been seeing her over the  last few days on social basis but Dr. Janee Morn requested a formal evaluation today because of decline in her white blood count and platelet. Her pancytopenia is most likely secondary to her recent chemotherapy. We will continue to monitor her blood counts closely and intervene as needed. I would consider restarting Neupogen if her absolute neutrophil count is less than 1000. The patient does not need any platelets or packed rbc's transfusion at this point. Thank you for taking good care of [name]. I will continue to follow up the patient with you on as-needed basis.

## 2012-09-14 NOTE — Clinical Social Work Psychosocial (Unsigned)
     Clinical Social Work Department BRIEF PSYCHOSOCIAL ASSESSMENT 09/14/2012  Patient:  Veronica Duarte, Veronica Duarte     Account Number:  [id number]     Admit date:  09/08/2012  Clinical Social Worker:  Hattie Perch  Date/Time:  09/14/2012 12:00 M  Referred by:  Physician  Date Referred:  09/14/2012 Referred for  SNF Placement   Other Referral:   Interview type:  Patient Other interview type:    PSYCHOSOCIAL DATA Living Status:  FAMILY Admitted from facility:   Level of care:   Primary support name:  kathryn vogel Primary support relationship to patient:  CHILD, ADULT Degree of support available:   good    CURRENT CONCERNS Current Concerns  Post-Acute Placement   Other Concerns:    SOCIAL WORK ASSESSMENT / PLAN CSW met with patient. patient is alert and oriented X3. discussed need for snf. patient agreeable to being faxed out and recieving bed offers.   Assessment/plan status:   Other assessment/ plan:   Information/referral to community resources:    PATIENTS/FAMILYS RESPONSE TO PLAN OF CARE: agreeable to being faxed out and recieving bed offers.

## 2012-09-14 NOTE — Progress Notes (Signed)
[facility] PROGRESS NOTE  Veronica Duarte HYQ:[id number] DOB: [birth year] DOA: 09/08/2012 PCP: [name], MD  Assessment/Plan:  #1 fall Likely secondary to mechanical fall secondary to dehydration, urinary tract infection, plus or minus pneumonia. CT of the head was negative. Urinalysis consistent with a UTI. Urine cultures 35,000 colonies of Klebsiella pneumonia . Repeat chest x-ray this morning was concerning for right sided pneumonia.  PT/OT change IV zosyn to oral Levaquin  #2 pneumonia Patient does have a history of metastatic lung cancer status post chemotherapy recently. Patient had neutropenia with a white count of 1.0 which is improving and now 4.9.  Patient also presented with confusion. Will treat patient's pneumonia is a healthcare associated pneumonia secondary to immunocompromised state. D/C IV Zosyn.  Start oral Levaquin. follow.  #3 rectal bleeding No further rectal bleeding. GI has been consulted. A GI likely feels perirectal and perineal bleeding. They recommended skin care consult and perineal skin care. Anusol has been added to patient's regimen. Patient with an episode of rectal bleeding 3 nights ago. Dr. Loreta Ave was informed and is following along. Follow.  #4 urinary tract infection Urine cultures and 35,000 colonies of Klebsiella pneumonia.  D/C IV Zosyn. Start oral levaquin.  #5 acute encephalopathy Likely secondary to urinary tract infection and probable pneumonia. Slowly improving clinically. Currently close to baseline. Continue IV fluids. D/C IV Zosyn. Start oral Levaquin. Follow.   #6 history of bilateral PE and DVT Continue Arixtra. Monitor closely with rectal bleeding. Monitor with thrombocytopenia.  #7 history of metastatic lung cancer Per oncology. Patient's oncologist is aware of patient's admission.  #8 right hip pain Plain films of the right hip are negative. Continue pain management.  #9 diarrhea C. difficile PCR is negative. Imodium PRN.  Follow.  #10 neutropenia/anemia/thrombocytopenia Likely secondary to recent chemotherapy therapy in the setting of urinary tract infection and possible pneumonia. Blood cultures pending. Chest x-ray consistent with a pneumonia. Urine cultures with 35,000 colonies of Klebsiella pneumonia.  WBC improved after a dose of Neupogen.WBC fluctuating. Platelets slowly trending down. D/C  IV Zosyn. A stat oral Levaquin. We'll monitor closely. Oncology following. Transfusion threshold hemoglobin less than 8.  #11 COPD Stable. Nebs as needed. Oxygen. Continue empiric antibiotics for problem #2.  #12 obstructive sleep apnea CPAP each bedtime.  #13 hypokalemia Repleted.Start on daily potasium.  #14 prophylaxis On Arixtra for DVT prophylaxis.      Code Status: full Family Communication: Updated patient. No family at bedside. Disposition Plan: SNF when medically stable   Consultants:  Gastroenterology: Dr. Juanda Chance 09/11/2012  Procedures:  CT head 09/09/2012  Chest x-ray 09/09/2012  Plain films of the right hip 09/09/2012  Antibiotics: IV Zosyn 09/08/2012---> 09/14/12 IV vancomycin 09/09/2012---->09/12/12 Oral Levaquin 09/14/12  HPI/Subjective:  Patient denies any rectal bleeding. No complaints.  Objective: Filed Vitals:   09/13/12 2200 09/14/12 0600 09/14/12 0813 09/14/12 1525  BP: 113/69 125/74  102/61  Pulse: 72 68  80  Temp: 98.2 F (36.8 C) 97.6 F (36.4 C)  97.9 F (36.6 C)  TempSrc: Oral Axillary  Oral  Resp: 18 20  20   Height:      Weight:      SpO2: 95% 98% 96% 99%   No intake or output data in the 24 hours ending 09/14/12 1558 Filed Weights   09/08/12 2300  Weight: 80 kg (176 lb 5.9 oz)    Exam:   General:  NAD  Cardiovascular: RRR  Respiratory: CTAB  Abdomen: SOFT/nd/nd/+ bs  Data Reviewed: Basic Metabolic Panel:  Lab 09/14/12 0450 09/13/12 0315 09/12/12 0447 09/11/12 1410 09/11/12 0415 09/10/12 0435 09/09/12 0514  NA 139 139 136 -- 133* 133*  --  K 3.5 4.2 4.4 3.6 2.7* -- --  CL 104 105 105 -- 100 100 --  CO2 22 21 20  -- 21 21 --  GLUCOSE 74 88 101* -- 87 102* --  BUN 3* 4* 5* -- 7 8 --  CREATININE 0.70 0.79 0.73 -- 0.78 0.76 --  CALCIUM 9.0 9.4 8.8 -- 8.7 8.8 --  MG -- 1.6 1.8 -- 1.8 1.7 1.5  PHOS -- -- -- -- -- -- --   Liver Function Tests:  Lab 09/08/12 1645  AST 22  ALT 13  ALKPHOS 51  BILITOT 0.6  PROT 6.0  ALBUMIN 2.9*   No results found for this basename: LIPASE:5,AMYLASE:5 in the last 168 hours No results found for this basename: AMMONIA:5 in the last 168 hours CBC:  Lab 09/14/12 0450 09/13/12 0315 09/12/12 0447 09/11/12 1000 09/11/12 0415 09/10/12 0435 09/08/12 1645  WBC 2.9* 4.9 3.1* 2.7* 2.3* -- --  NEUTROABS -- -- -- -- 1.7 1.1* 0.8*  HGB 8.0* 8.5* 8.1* 9.0* 8.1* -- --  HCT 23.6* 26.1* 23.6* 26.0* 22.9* -- --  MCV 93.7 94.6 92.9 91.5 91.2 -- --  PLT 60* 74* 74* 96* 85* -- --   Cardiac Enzymes: No results found for this basename: CKTOTAL:5,CKMB:5,CKMBINDEX:5,TROPONINI:5 in the last 168 hours BNP (last 3 results) No results found for this basename: PROBNP:3 in the last 8760 hours CBG:  Lab 09/14/12 1230 09/14/12 0802 09/13/12 2224 09/13/12 1637 09/13/12 1134  GLUCAP 79 79 81 84 109*    Recent Results (from the past 240 hour(s))  URINE CULTURE     Status: Normal   Collection Time   09/08/12  5:15 PM      Component Value Range Status Comment   Specimen Description URINE, CLEAN CATCH   Final    Special Requests NONE   Final    Culture  Setup Time 09/09/2012 03:16   Final    Colony Count 35,000 COLONIES/ML   Final    Culture KLEBSIELLA PNEUMONIAE   Final    Report Status 09/10/2012 FINAL   Final    Organism ID, Bacteria KLEBSIELLA PNEUMONIAE   Final   CLOSTRIDIUM DIFFICILE BY PCR     Status: Normal   Collection Time   09/09/12  1:15 AM      Component Value Range Status Comment   C difficile by pcr NEGATIVE  NEGATIVE Final   CULTURE, BLOOD (ROUTINE X 2)     Status: Normal (Preliminary  result)   Collection Time   09/09/12  4:00 PM      Component Value Range Status Comment   Specimen Description BLOOD RIGHT HAND   Final    Special Requests BOTTLES DRAWN AEROBIC AND ANAEROBIC 10CC   Final    Culture  Setup Time 09/09/2012 23:20   Final    Culture     Final    Value:        BLOOD CULTURE RECEIVED NO GROWTH TO DATE CULTURE WILL BE HELD FOR 5 DAYS BEFORE ISSUING A FINAL NEGATIVE REPORT   Report Status PENDING   Incomplete   CULTURE, BLOOD (ROUTINE X 2)     Status: Normal (Preliminary result)   Collection Time   09/09/12  4:20 PM      Component Value Range Status Comment   Specimen Description BLOOD RIGHT ARM   Final  Special Requests BOTTLES DRAWN AEROBIC AND ANAEROBIC 10CC   Final    Culture  Setup Time 09/09/2012 23:20   Final    Culture     Final    Value:        BLOOD CULTURE RECEIVED NO GROWTH TO DATE CULTURE WILL BE HELD FOR 5 DAYS BEFORE ISSUING A FINAL NEGATIVE REPORT   Report Status PENDING   Incomplete      Studies: No results found.  Scheduled Meds:    . atorvastatin  20 mg Oral q1800  . budesonide-formoterol  2 puff Inhalation BID  . buPROPion  150 mg Oral Daily  . diltiazem  120 mg Oral Daily  . DULoxetine  60 mg Oral Daily  . ezetimibe  10 mg Oral Daily  . feeding supplement  237 mL Oral BID BM  . ferrous sulfate  325 mg Oral Q breakfast  . folic acid  1 mg Oral Daily  . fondaparinux  7.5 mg Subcutaneous QHS  . hydrocortisone   Rectal BID  . insulin aspart  0-9 Units Subcutaneous TID WC  . magnesium oxide  400 mg Oral QHS  . magnesium oxide  800 mg Oral Daily  . megestrol  400 mg Oral Daily  . memantine  10 mg Oral Daily  . ondansetron (ZOFRAN) IV  4 mg Intravenous Once  . piperacillin-tazobactam (ZOSYN)  IV  3.375 g Intravenous Q8H  . potassium chloride  40 mEq Oral Daily  . tiotropium  18 mcg Inhalation Daily   Continuous Infusions:    . sodium chloride 50 mL/hr at 09/14/12 1191    Principal Problem:  *Fall Active Problems:   Type II or unspecified type diabetes mellitus with renal manifestations, uncontrolled(250.42)  HYPERLIPIDEMIA  DEMENTIA  DEPRESSION  HYPERTENSION  COPD  ADENOCARCINOMA, RIGHT LUNG, UPPER LOBE  CAD (coronary artery disease)  PE (pulmonary embolism)  UTI (lower urinary tract infection)  Neutropenia  Encephalopathy  Dehydration  PNA (pneumonia)  Hypokalemia  Anemia  Rectal bleeding    Time spent: > 35 mins    [facility]  [facility] Pager [phone]. If 8PM-8AM, please contact night-coverage at [email or web address], password [facility] 09/14/2012, 3:58 PM  LOS: 6 days

## 2012-09-15 LAB — GLUCOSE, CAPILLARY
Glucose-Capillary: 123 mg/dL — ABNORMAL HIGH (ref 70–99)
Glucose-Capillary: 105 mg/dL — ABNORMAL HIGH (ref 70–99)

## 2012-09-15 LAB — CULTURE, BLOOD (ROUTINE X 2)
Culture: NO GROWTH
Culture: NO GROWTH

## 2012-09-15 LAB — PREPARE RBC (CROSSMATCH)

## 2012-09-15 LAB — BASIC METABOLIC PANEL
Calcium: 8.8 mg/dL (ref 8.4–10.5)
Creatinine, Ser: 0.98 mg/dL (ref 0.50–1.10)
GFR calc Af Amer: 67 mL/min — ABNORMAL LOW (ref >90)

## 2012-09-15 LAB — CBC WITH DIFFERENTIAL/PLATELET
Basophils Relative: 0 % (ref 0–1)
Basophils Relative: 0 % (ref 0–1)
Eosinophils Absolute: 0 10*3/uL (ref 0.0–0.7)
HCT: 26.2 % — ABNORMAL LOW (ref 36.0–46.0)
Hemoglobin: 9.1 g/dL — ABNORMAL LOW (ref 12.0–15.0)
Lymphs Abs: 1.2 10*3/uL (ref 0.7–4.0)
MCH: 31.4 pg (ref 26.0–34.0)
MCHC: 33.9 g/dL (ref 30.0–36.0)
MCHC: 34.7 g/dL (ref 30.0–36.0)
Monocytes Absolute: 0.4 10*3/uL (ref 0.1–1.0)
Monocytes Relative: 12 % (ref 3–12)
Monocytes Relative: 14 % — ABNORMAL HIGH (ref 3–12)
Neutro Abs: 1.4 10*3/uL — ABNORMAL LOW (ref 1.7–7.7)
Neutrophils Relative %: 44 % (ref 43–77)
Platelets: 70 10*3/uL — ABNORMAL LOW (ref 150–400)
RBC: 2.86 MIL/uL — ABNORMAL LOW (ref 3.87–5.11)

## 2012-09-15 MED ORDER — DIPHENHYDRAMINE HCL 25 MG PO CAPS
25.0000 mg | ORAL_CAPSULE | Freq: Once | ORAL | Status: AC
  Administered 2012-09-15: 25 mg via ORAL
  Filled 2012-09-15: qty 1

## 2012-09-15 MED ORDER — ACETAMINOPHEN 325 MG PO TABS
650.0000 mg | ORAL_TABLET | Freq: Once | ORAL | Status: AC
  Administered 2012-09-15: 650 mg via ORAL

## 2012-09-15 NOTE — Clinical Documentation Improvement (Signed)
MALNUTRITION DOCUMENTATION CLARIFICATION  THIS DOCUMENT IS NOT A PERMANENT PART OF THE MEDICAL RECORD  TO RESPOND TO THE THIS QUERY, FOLLOW THE INSTRUCTIONS BELOW:  1. If needed, update documentation for the patient's encounter via the notes activity.  2. Access this query again and click edit on the In [employer].  3. After updating, or not, click F2 to complete all highlighted (required) fields concerning your review. Select "additional documentation in the medical record" OR "no additional documentation provided".  4. Click Sign note button.  5. The deficiency will fall out of your In Basket *Please let us know if you are not able to complete this workflow by phone or e-mail (listed below).  Please update your documentation within the medical record to reflect your response to this query.                                                                                        09/15/12   Dear Dr. Betti Cruz / Associates,  In a better effort to capture your patient's severity of illness, reflect appropriate length of stay and utilization of resources, a review of the patient medical record has revealed the following indicators.    Based on your clinical judgment, please clarify and document in a progress note and/or discharge summary the clinical condition associated with the following supporting information:  In responding to this query please exercise your independent judgment.  The fact that a query is asked, does not imply that any particular answer is desired or expected. According to  nutritional consult note on 09/09/12  patient meets criteria for "severe malnutrition in the context of chronic illness"    If this is an appropriate diagnosis please document, if not please clarify the malnutrition status of patient if known. Thank you  .  Severe Malnutrition     .  Severe Protein Calorie Malnutrition    _______Other Condition________________ _______Cannot clinically  determine     Supporting Information:  Risk Factors:Nutrition risk , ADENOCARCINOMA, RIGHT LUNG, Diabetes mellitus type 2,   Signs & Symptoms: -Ht: 59ft 5in      Wt:176 lbs  -BMI:29.35   -Weight  Loss: Past records show pt's weight is down 27 pounds since May of this year  -Diagnostics: -Albumin level: 2.9  -Total Protein: 6.0  -Calcium level:8.6  Treatments:Ensure Complete BID, Megace daily,  -Nutrition Consult:Pt meets criteria for severe malnutrition of chronic illness AEB <75% estimated energy intake for the past several months and 13.3% weight loss in the past 5 months.       You may use possible, probable, or suspect with inpatient documentation. possible, probable, suspected diagnoses MUST be documented at the time of discharge  Reviewed: additional documentation in the medical record  Thank You, Andy Gauss RN  Clinical Documentation Specialist:  Pager [phone] E-mail [email or web address]   Health Information Management [place]

## 2012-09-15 NOTE — Progress Notes (Signed)
CSW called patient's daughter, informed her that bed offers have been left at bedside. She is agreeable and will choose a facility.  Veronica Duarte MSW, LCSW [phone]

## 2012-09-15 NOTE — Progress Notes (Signed)
Physical Therapy Treatment Patient Details Name: Veronica Duarte MRN: [id number] DOB: 05-11-1946 Today's Date: 09/15/2012 Time: 4098-1191 PT Time Calculation (min): 30 min  PT Assessment / Plan / Recommendation Comments on Treatment Session  Mobility and tolerance slowly improving.     Follow Up Recommendations  Home health PT;Supervision/Assistance - 24 hour;Post acute inpatient-SNF     Does the patient have the potential to tolerate intense rehabilitation  No, Recommend SNF if pt/family agreeable  Barriers to Discharge        Equipment Recommendations  3 in 1 bedside comode    Recommendations for Other Services OT consult  Frequency Min 3X/week   Plan Discharge plan remains appropriate    Precautions / Restrictions Precautions Precautions: Fall Restrictions Weight Bearing Restrictions: No   Pertinent Vitals/Pain Pt denies    Mobility  Bed Mobility Bed Mobility: Supine to Sit;Sit to Supine Supine to Sit: 5: Supervision;HOB elevated;With rails Sit to Supine: 5: Supervision;HOB elevated;With rail Transfers Transfers: Sit to Stand;Stand to Sit Sit to Stand: 4: Min assist;From bed Stand to Sit: 4: Min guard;To bed Details for Transfer Assistance: x 2. VCs safety, technique, hand placement. Asssit to rise, stabilize.  Ambulation/Gait: Min assist Ambulation Distance (Feet): 55 Feet (x2) Assistive device: Rolling walker Ambulation/Gait Assistance Details: VCs safety, distance/position from Rw. Assist to stabilize and maneuver with RW. Seated rest break needed between walks. VCs pursed lip breathng. Dyspnea 3/4 with ambulation.  Gait Pattern: Step-through pattern;Decreased stride length;Trunk flexed    Exercises     PT Diagnosis:    PT Problem List:   PT Treatment Interventions:     PT Goals Acute Rehab PT Goals Pt will go Supine/Side to Sit: with modified independence PT Goal: Supine/Side to Sit - Progress: Progressing toward goal Pt will go Sit to Supine/Side:  with modified independence PT Goal: Sit to Supine/Side - Progress: Progressing toward goal Pt will go Sit to Stand: with modified independence PT Goal: Sit to Stand - Progress: Progressing toward goal Pt will Ambulate: 51 - 150 feet;with least restrictive assistive device;with supervision PT Goal: Ambulate - Progress: Progressing toward goal  Visit Information  Last PT Received On: 09/15/12 Assistance Needed: +1    Subjective Data      Cognition  Overall Cognitive Status: Appears within functional limits for tasks assessed/performed Arousal/Alertness: Awake/alert Orientation Level: Appears intact for tasks assessed Behavior During Session: [facility] for tasks performed    Balance     End of Session PT - End of Session Equipment Utilized During Treatment: Gait belt Activity Tolerance: Patient limited by fatigue Patient left: in bed;with call bell/phone within reach;with bed alarm set   GP     [name] [facility] 09/15/2012, 4:33 PM [phone]

## 2012-09-15 NOTE — Progress Notes (Signed)
[facility] PROGRESS NOTE  Veronica Duarte ZOX:[id number] DOB: 1946-01-13 DOA: 09/08/2012 PCP: [name], MD  Assessment/Plan:  #1 fall Likely secondary to mechanical fall secondary to dehydration, urinary tract infection, plus or minus pneumonia. CT of the head was negative. Urinalysis consistent with a UTI. Urine cultures 35,000 colonies of Klebsiella pneumonia . Repeat chest x-ray this morning was concerning for right sided pneumonia.  PT/OT. Continue oral Levaquin  #2 pneumonia Patient does have a history of metastatic lung cancer status post chemotherapy recently. Patient had neutropenia with a white count of 1.0 which is improving and now 2.6.  Patient also presented with confusion. Will treat patient's pneumonia is a healthcare associated pneumonia secondary to immunocompromised state. Continue oral Levaquin. follow.  #3 rectal bleeding No further rectal bleeding. GI has been consulted. A GI likely feels perirectal and perineal bleeding. They recommended skin care consult and perineal skin care. Anusol has been added to patient's regimen. Patient with an episode of rectal bleeding 3 nights ago. Dr. Loreta Ave was informed and is following along. Follow.  #4 urinary tract infection Urine cultures and 35,000 colonies of Klebsiella pneumonia.  Continue oral levaquin.  #5 acute encephalopathy Likely secondary to urinary tract infection and probable pneumonia. Slowly improving clinically. Currently close to baseline. Continue IV fluids. Continue oral Levaquin. Follow.   #6 history of bilateral PE and DVT Continue Arixtra. Monitor closely with rectal bleeding. Monitor with thrombocytopenia.  #7 history of metastatic lung cancer Per oncology. Patient's oncologist is aware of patient's admission.  #8 right hip pain Plain films of the right hip are negative. Continue pain management.  #9 diarrhea C. difficile PCR is negative. Imodium PRN. Follow.  #10  neutropenia/anemia/thrombocytopenia Likely secondary to recent chemotherapy therapy in the setting of urinary tract infection and possible pneumonia. Blood cultures pending. Chest x-ray consistent with a pneumonia. Urine cultures with 35,000 colonies of Klebsiella pneumonia.  WBC improved after a dose of Neupogen.WBC fluctuating. Platelets slowly trending down.Hgb at 7.7. Transfuse 1 unit PRBC.  Continue oral Levaquin. We'll monitor closely. Oncology following. Transfusion threshold hemoglobin less than 8.  #11 COPD Stable. Nebs as needed. Oxygen. Continue empiric antibiotics for problem #2.  #12 obstructive sleep apnea CPAP each bedtime.  #13 hypokalemia Repleted.Start on daily potasium.  #14 prophylaxis On Arixtra for DVT prophylaxis.      Code Status: full Family Communication: Updated patient. No family at bedside. Disposition Plan: SNF when medically stable   Consultants:  Gastroenterology: Dr. Juanda Chance 09/11/2012  Procedures:  CT head 09/09/2012  Chest x-ray 09/09/2012  Plain films of the right hip 09/09/2012  1 unit PRBC 09/15/12  Antibiotics: IV Zosyn 09/08/2012---> 09/14/12 IV vancomycin 09/09/2012---->09/12/12 Oral Levaquin 09/14/12  HPI/Subjective:  Patient denies any rectal bleeding. No complaints.  Objective: Filed Vitals:   09/15/12 1130 09/15/12 1149 09/15/12 1249 09/15/12 1350  BP: 100/62 122/72 131/84 104/69  Pulse: 79 76 87 88  Temp: 98.1 F (36.7 C) 98.3 F (36.8 C) 98 F (36.7 C) 98.1 F (36.7 C)  TempSrc: Oral Oral Oral Oral  Resp: 20 20 18 16   Height:      Weight:      SpO2:        Intake/Output Summary (Last 24 hours) at 09/15/12 1407 Last data filed at 09/15/12 1319  Gross per 24 hour  Intake    710 ml  Output      0 ml  Net    710 ml   Filed Weights   09/08/12 2300  Weight: 80  kg (176 lb 5.9 oz)    Exam:   General:  NAD  Cardiovascular: RRR  Respiratory: CTAB  Abdomen: SOFT/nd/nd/+ bs  Data Reviewed: Basic  Metabolic Panel:  Lab 09/15/12 1610 09/14/12 0450 09/13/12 0315 09/12/12 0447 09/11/12 1410 09/11/12 0415 09/10/12 0435 09/09/12 0514  NA 138 139 139 136 -- 133* -- --  K 3.6 3.5 4.2 4.4 3.6 -- -- --  CL 105 104 105 105 -- 100 -- --  CO2 22 22 21 20  -- 21 -- --  GLUCOSE 89 74 88 101* -- 87 -- --  BUN 10 3* 4* 5* -- 7 -- --  CREATININE 0.98 0.70 0.79 0.73 -- 0.78 -- --  CALCIUM 8.8 9.0 9.4 8.8 -- 8.7 -- --  MG -- -- 1.6 1.8 -- 1.8 1.7 1.5  PHOS -- -- -- -- -- -- -- --   Liver Function Tests:  Lab 09/08/12 1645  AST 22  ALT 13  ALKPHOS 51  BILITOT 0.6  PROT 6.0  ALBUMIN 2.9*   No results found for this basename: LIPASE:5,AMYLASE:5 in the last 168 hours No results found for this basename: AMMONIA:5 in the last 168 hours CBC:  Lab 09/15/12 0425 09/14/12 0450 09/13/12 0315 09/12/12 0447 09/11/12 1000 09/11/12 0415 09/10/12 0435 09/08/12 1645  WBC 2.6* 2.9* 4.9 3.1* 2.7* -- -- --  NEUTROABS 1.2* -- -- -- -- 1.7 1.1* 0.8*  HGB 7.7* 8.0* 8.5* 8.1* 9.0* -- -- --  HCT 22.7* 23.6* 26.1* 23.6* 26.0* -- -- --  MCV 92.7 93.7 94.6 92.9 91.5 -- -- --  PLT 70* 60* 74* 74* 96* -- -- --   Cardiac Enzymes: No results found for this basename: CKTOTAL:5,CKMB:5,CKMBINDEX:5,TROPONINI:5 in the last 168 hours BNP (last 3 results) No results found for this basename: PROBNP:3 in the last 8760 hours CBG:  Lab 09/15/12 1221 09/15/12 0741 09/14/12 2146 09/14/12 1702 09/14/12 1230  GLUCAP 89 98 123* 81 79    Recent Results (from the past 240 hour(s))  URINE CULTURE     Status: Normal   Collection Time   09/08/12  5:15 PM      Component Value Range Status Comment   Specimen Description URINE, CLEAN CATCH   Final    Special Requests NONE   Final    Culture  Setup Time 09/09/2012 03:16   Final    Colony Count 35,000 COLONIES/ML   Final    Culture KLEBSIELLA PNEUMONIAE   Final    Report Status 09/10/2012 FINAL   Final    Organism ID, Bacteria KLEBSIELLA PNEUMONIAE   Final   CLOSTRIDIUM  DIFFICILE BY PCR     Status: Normal   Collection Time   09/09/12  1:15 AM      Component Value Range Status Comment   C difficile by pcr NEGATIVE  NEGATIVE Final   CULTURE, BLOOD (ROUTINE X 2)     Status: Normal   Collection Time   09/09/12  4:00 PM      Component Value Range Status Comment   Specimen Description BLOOD RIGHT HAND   Final    Special Requests BOTTLES DRAWN AEROBIC AND ANAEROBIC 10CC   Final    Culture  Setup Time 09/09/2012 23:20   Final    Culture NO GROWTH 5 DAYS   Final    Report Status 09/15/2012 FINAL   Final   CULTURE, BLOOD (ROUTINE X 2)     Status: Normal   Collection Time   09/09/12  4:20 PM  Component Value Range Status Comment   Specimen Description BLOOD RIGHT ARM   Final    Special Requests BOTTLES DRAWN AEROBIC AND ANAEROBIC 10CC   Final    Culture  Setup Time 09/09/2012 23:20   Final    Culture NO GROWTH 5 DAYS   Final    Report Status 09/15/2012 FINAL   Final      Studies: No results found.  Scheduled Meds:    . acetaminophen  650 mg Oral Once  . atorvastatin  20 mg Oral q1800  . budesonide-formoterol  2 puff Inhalation BID  . buPROPion  150 mg Oral Daily  . diltiazem  120 mg Oral Daily  . diphenhydrAMINE  25 mg Oral Once  . DULoxetine  60 mg Oral Daily  . ezetimibe  10 mg Oral Daily  . feeding supplement  237 mL Oral BID BM  . ferrous sulfate  325 mg Oral Q breakfast  . folic acid  1 mg Oral Daily  . fondaparinux  7.5 mg Subcutaneous QHS  . insulin aspart  0-9 Units Subcutaneous TID WC  . levofloxacin  750 mg Oral Daily  . magnesium oxide  400 mg Oral QHS  . magnesium oxide  800 mg Oral Daily  . megestrol  400 mg Oral Daily  . memantine  10 mg Oral Daily  . ondansetron (ZOFRAN) IV  4 mg Intravenous Once  . potassium chloride  40 mEq Oral Daily  . tiotropium  18 mcg Inhalation Daily  . DISCONTD: piperacillin-tazobactam (ZOSYN)  IV  3.375 g Intravenous Q8H   Continuous Infusions:    . DISCONTD: sodium chloride 50 mL/hr at  09/14/12 7829    Principal Problem:  *Fall Active Problems:  Type II or unspecified type diabetes mellitus with renal manifestations, uncontrolled(250.42)  HYPERLIPIDEMIA  DEMENTIA  DEPRESSION  HYPERTENSION  COPD  ADENOCARCINOMA, RIGHT LUNG, UPPER LOBE  CAD (coronary artery disease)  PE (pulmonary embolism)  UTI (lower urinary tract infection)  Neutropenia  Encephalopathy  Dehydration  PNA (pneumonia)  Hypokalemia  Anemia  Rectal bleeding    Time spent: > 35 mins    [facility]  [facility] Pager [phone]. If 8PM-8AM, please contact night-coverage at [email or web address], password [facility] 09/15/2012, 2:07 PM  LOS: 7 days

## 2012-09-15 NOTE — Progress Notes (Signed)
PT Cancellation Note  Patient Details Name: Veronica Duarte MRN: [id number] DOB: 10-31-1946   Cancelled Treatment:     Noted pt's Hgb 7.7-pt currently receiving blood. Will check back later today or on tomorrow. Thanks.    [name] [facility] 09/15/2012, 1:33 PM [phone]

## 2012-09-15 NOTE — Progress Notes (Signed)
Spoke with patient regarding her home cpap unit.  Pt stated she can/will place it on herself tonight.  Sterile water was added to max fill line of humidity chamber.  Pt was advised that RT available all night should she need assistance.

## 2012-09-16 LAB — TYPE AND SCREEN: Unit division: 0

## 2012-09-16 LAB — GLUCOSE, CAPILLARY
Glucose-Capillary: 94 mg/dL (ref 70–99)
Glucose-Capillary: 131 mg/dL — ABNORMAL HIGH (ref 70–99)

## 2012-09-16 NOTE — Progress Notes (Signed)
OT Cancellation Note  Patient Details Name: SHAQUIRA MOROZ MRN: [id number] DOB: 1946-08-14   Cancelled Treatment:    Reason Eval/Treat Not Completed: Other (comment) (Pt's refusal to participate.)  Tilden Broz A OTR/L [phone] 09/16/2012, 3:49 PM

## 2012-09-16 NOTE — Progress Notes (Addendum)
Subjective: Still coughing but feels like her breathing is better overall. No other specific concerns.  Objective: Vital signs in last 24 hours: Filed Vitals:   09/15/12 1700 09/15/12 2012 09/15/12 2145 09/16/12 0654  BP: 105/66  131/80 145/69  Pulse: 78  81 77  Temp: 98.8 F (37.1 C)  98.7 F (37.1 C) 98.7 F (37.1 C)  TempSrc: Oral  Oral Oral  Resp: 16  20 20   Height:      Weight:      SpO2: 93% 95% 95% 94%   Weight change:   Intake/Output Summary (Last 24 hours) at 09/16/12 1056 Last data filed at 09/16/12 0845  Gross per 24 hour  Intake   1660 ml  Output    250 ml  Net   1410 ml    Physical Exam: General: Awake, Oriented, No acute distress. HEENT: EOMI. Neck: Supple CV: S1 and S2 Lungs: Clear to ascultation bilaterally, crackles at bases. Abdomen: Soft, Nontender, Nondistended, +bowel sounds. Ext: Good pulses. Trace edema.  Lab Results: Basic Metabolic Panel:  Lab 09/15/12 1610 09/14/12 0450 09/13/12 0315 09/12/12 0447 09/11/12 1410 09/11/12 0415 09/10/12 0435  NA 138 139 139 136 -- 133* --  K 3.6 3.5 4.2 4.4 3.6 -- --  CL 105 104 105 105 -- 100 --  CO2 22 22 21 20  -- 21 --  GLUCOSE 89 74 88 101* -- 87 --  BUN 10 3* 4* 5* -- 7 --  CREATININE 0.98 0.70 0.79 0.73 -- 0.78 --  CALCIUM 8.8 9.0 9.4 8.8 -- 8.7 --  MG -- -- 1.6 1.8 -- 1.8 1.7  PHOS -- -- -- -- -- -- --   Liver Function Tests: No results found for this basename: AST:5,ALT:5,ALKPHOS:5,BILITOT:5,PROT:5,ALBUMIN:5 in the last 168 hours No results found for this basename: LIPASE:5,AMYLASE:5 in the last 168 hours No results found for this basename: AMMONIA:5 in the last 168 hours CBC:  Lab 09/15/12 1550 09/15/12 0425 09/14/12 0450 09/13/12 0315 09/12/12 0447 09/11/12 0415 09/10/12 0435  WBC 3.1* 2.6* 2.9* 4.9 3.1* -- --  NEUTROABS 1.4* 1.2* -- -- -- 1.7 1.1*  HGB 9.1* 7.7* 8.0* 8.5* 8.1* -- --  HCT 26.2* 22.7* 23.6* 26.1* 23.6* -- --  MCV 91.6 92.7 93.7 94.6 92.9 -- --  PLT 68* 70* 60* 74* 74* --  --   Cardiac Enzymes: No results found for this basename: CKTOTAL:5,CKMB:5,CKMBINDEX:5,TROPONINI:5 in the last 168 hours BNP (last 3 results) No results found for this basename: PROBNP:3 in the last 8760 hours CBG:  Lab 09/16/12 0748 09/15/12 2223 09/15/12 1656 09/15/12 1221 09/15/12 0741  GLUCAP 85 105* 92 89 98   No results found for this basename: HGBA1C:5 in the last 72 hours Other Labs: No components found with this basename: POCBNP:3 No results found for this basename: DDIMER:2 in the last 168 hours No results found for this basename: CHOL:2,HDL:2,LDLCALC:2,TRIG:2,CHOLHDL:2,LDLDIRECT:2 in the last 168 hours No results found for this basename: TSH,T4TOTAL,FREET3,T3FREE,FREET4,THYROIDAB in the last 168 hours  Lab 09/10/12 0435  VITAMINB12 912*  FOLATE 18.9  FERRITIN 2290*  TIBC 178*  IRON 75  RETICCTPCT --    Micro Results: Recent Results (from the past 240 hour(s))  URINE CULTURE     Status: Normal   Collection Time   09/08/12  5:15 PM      Component Value Range Status Comment   Specimen Description URINE, CLEAN CATCH   Final    Special Requests NONE   Final    Culture  Setup Time 09/09/2012  03:16   Final    Colony Count 35,000 COLONIES/ML   Final    Culture KLEBSIELLA PNEUMONIAE   Final    Report Status 09/10/2012 FINAL   Final    Organism ID, Bacteria KLEBSIELLA PNEUMONIAE   Final   CLOSTRIDIUM DIFFICILE BY PCR     Status: Normal   Collection Time   09/09/12  1:15 AM      Component Value Range Status Comment   C difficile by pcr NEGATIVE  NEGATIVE Final   CULTURE, BLOOD (ROUTINE X 2)     Status: Normal   Collection Time   09/09/12  4:00 PM      Component Value Range Status Comment   Specimen Description BLOOD RIGHT HAND   Final    Special Requests BOTTLES DRAWN AEROBIC AND ANAEROBIC 10CC   Final    Culture  Setup Time 09/09/2012 23:20   Final    Culture NO GROWTH 5 DAYS   Final    Report Status 09/15/2012 FINAL   Final   CULTURE, BLOOD (ROUTINE X 2)      Status: Normal   Collection Time   09/09/12  4:20 PM      Component Value Range Status Comment   Specimen Description BLOOD RIGHT ARM   Final    Special Requests BOTTLES DRAWN AEROBIC AND ANAEROBIC 10CC   Final    Culture  Setup Time 09/09/2012 23:20   Final    Culture NO GROWTH 5 DAYS   Final    Report Status 09/15/2012 FINAL   Final     Studies/Results: No results found.  Medications: I have reviewed the patient's current medications. Scheduled Meds:   . acetaminophen  650 mg Oral Once  . atorvastatin  20 mg Oral q1800  . budesonide-formoterol  2 puff Inhalation BID  . buPROPion  150 mg Oral Daily  . diltiazem  120 mg Oral Daily  . diphenhydrAMINE  25 mg Oral Once  . DULoxetine  60 mg Oral Daily  . ezetimibe  10 mg Oral Daily  . feeding supplement  237 mL Oral BID BM  . ferrous sulfate  325 mg Oral Q breakfast  . folic acid  1 mg Oral Daily  . fondaparinux  7.5 mg Subcutaneous QHS  . insulin aspart  0-9 Units Subcutaneous TID WC  . levofloxacin  750 mg Oral Daily  . magnesium oxide  400 mg Oral QHS  . magnesium oxide  800 mg Oral Daily  . megestrol  400 mg Oral Daily  . memantine  10 mg Oral Daily  . ondansetron (ZOFRAN) IV  4 mg Intravenous Once  . potassium chloride  40 mEq Oral Daily  . tiotropium  18 mcg Inhalation Daily   Continuous Infusions:  PRN Meds:.acetaminophen, acetaminophen, acetaminophen, albuterol, albuterol, ipratropium, loperamide, ondansetron (ZOFRAN) IV, ondansetron, sodium chloride  Assessment/Plan: Fall  Likely secondary to mechanical fall secondary to dehydration, urinary tract infection, plus or minus pneumonia. CT of the head was negative. Urinalysis consistent with a UTI. Urine cultures 35,000 colonies of Klebsiella pneumonia. PT/OT. Continue oral Levaquin   Pneumonia  Patient does have a history of metastatic lung cancer status post chemotherapy recently. Patient had neutropenia with a white count of 1.0 which is improving and now 2.6.  Patient also presented with confusion. Patient's pneumonia is a healthcare associated pneumonia secondary to immunocompromised state. Discontinue Levaquin, as patient completed 8 day course of antibiotic.  Rectal bleeding  No further rectal bleeding. GI has been consulted. A GI  likely feels perirectal and perineal bleeding. They recommended skin care consult and perineal skin care. Anusol has been added to patient's regimen. Patient with an episode of rectal bleeding 3 nights ago. Dr. Loreta Ave was informed and is following along. Follow.   Urinary tract infection  Urine cultures and 35,000 colonies of Klebsiella pneumonia sensitive to ciprofloxacin. Patient completed 8 day course of antibiotics.   Acute encephalopathy  Likely secondary to urinary tract infection and probable pneumonia. Slowly improving clinically. Currently close to baseline. Continue IV fluids. Continue oral Levaquin. Follow.   History of bilateral PE and DVT  Continue Fondaparinux. Monitor closely with rectal bleeding. Monitor with thrombocytopenia.   History of metastatic lung cancer  Per oncology. Patient's oncologist is aware of patient's admission.   Right hip pain  Plain films of the right hip on 09/09/2012 are negative. Continue pain management.   Diarrhea  C. difficile PCR is negative. Imodium PRN. Follow.   Neutropenia/anemia/thrombocytopenia (pancytopenia) Likely secondary to recent chemotherapy therapy in the setting of urinary tract infection and possible pneumonia. Blood cultures pending. Chest x-ray consistent with a pneumonia. Urine cultures with 35,000 colonies of Klebsiella pneumonia. WBC improved after a dose of Neupogen.WBC fluctuating. Platelets slowly trending down.Hgb at 7.7. Transfused 1 unit PRBC on 09/15/2012. Will monitor closely. Oncology following. Transfusion threshold hemoglobin less than 8.   COPD  Stable. Nebs as needed. Oxygen. Continue empiric antibiotics for problem #2.   Obstructive sleep  apnea  CPAP each bedtime.   Hypokalemia  Repleted. Start on daily potasium.   Protein calorie malnutrition Continue ensure and megace.  Prophylaxis  On Arixtra for DVT prophylaxis.   Code Status: full  Family Communication: Updated patient. No family at bedside.  Disposition Plan: SNF when medically stable   Consultants:  Gastroenterology: Dr. Juanda Chance 09/11/2012  Procedures:  CT head 09/09/2012  Chest x-ray 09/09/2012  Plain films of the right hip 09/09/2012  1 unit PRBC 09/15/12  Antibiotics:  IV Zosyn 09/08/2012---> 09/14/12  IV vancomycin 09/09/2012---->09/12/12  Oral Levaquin 09/14/12 ---> 09/16/2012   LOS: 8 days  Ricky Doan A, MD 09/16/2012, 10:56 AM

## 2012-09-16 NOTE — Progress Notes (Signed)
Pt stated she will place it on herself later when ready.  Pt was advised that RT available all night and encouraged to call should she need assistance.  Water in humidity chamber noted near fill line.

## 2012-09-16 NOTE — Progress Notes (Signed)
Nutrition Follow-up  Intervention: D/C Ensure Complete. Encouraged continued excellent meal intake. Nutrition signing off.   Diet Order: Heart healthy, Ensure Complete BID  - Pt reports improved appetite with meal intake now 50-100%. Pt reports not drinking the Ensure, will d/c as pt eating well.    Meds: Scheduled Meds:   . atorvastatin  20 mg Oral q1800  . budesonide-formoterol  2 puff Inhalation BID  . buPROPion  150 mg Oral Daily  . diltiazem  120 mg Oral Daily  . DULoxetine  60 mg Oral Daily  . ezetimibe  10 mg Oral Daily  . feeding supplement  237 mL Oral BID BM  . ferrous sulfate  325 mg Oral Q breakfast  . folic acid  1 mg Oral Daily  . fondaparinux  7.5 mg Subcutaneous QHS  . insulin aspart  0-9 Units Subcutaneous TID WC  . levofloxacin  750 mg Oral Daily  . magnesium oxide  400 mg Oral QHS  . magnesium oxide  800 mg Oral Daily  . megestrol  400 mg Oral Daily  . memantine  10 mg Oral Daily  . ondansetron (ZOFRAN) IV  4 mg Intravenous Once  . potassium chloride  40 mEq Oral Daily  . tiotropium  18 mcg Inhalation Daily   Continuous Infusions:  PRN Meds:.acetaminophen, acetaminophen, acetaminophen, albuterol, albuterol, ipratropium, loperamide, ondansetron (ZOFRAN) IV, ondansetron, sodium chloride   CMP     Component Value Date/Time   NA 138 09/15/2012 0425   NA 141 09/03/2012 0918   NA 145 09/06/2011 0902   K 3.6 09/15/2012 0425   K 3.8 09/03/2012 0918   K 3.9 09/06/2011 0902   CL 105 09/15/2012 0425   CL 106 09/03/2012 0918   CL 100 09/06/2011 0902   CO2 22 09/15/2012 0425   CO2 19* 09/03/2012 0918   CO2 26 09/06/2011 0902   GLUCOSE 89 09/15/2012 0425   GLUCOSE 133* 09/03/2012 0918   GLUCOSE 181* 09/06/2011 0902   BUN 10 09/15/2012 0425   BUN 11.0 09/03/2012 0918   BUN 12 09/06/2011 0902   CREATININE 0.98 09/15/2012 0425   CREATININE 0.8 09/03/2012 0918   CREATININE 0.5* 09/06/2011 0902   CALCIUM 8.8 09/15/2012 0425   CALCIUM 9.8 09/03/2012 0918   CALCIUM 9.5 09/06/2011 0902   PROT 6.0 09/08/2012 1645   PROT 6.1* 09/03/2012 0918   PROT 6.9 09/06/2011 0902   ALBUMIN 2.9* 09/08/2012 1645   ALBUMIN 3.4* 09/03/2012 0918   AST 22 09/08/2012 1645   AST 20 09/03/2012 0918   AST 24 09/06/2011 0902   ALT 13 09/08/2012 1645   ALT 19 09/03/2012 0918   ALKPHOS 51 09/08/2012 1645   ALKPHOS 66 09/03/2012 0918   ALKPHOS 55 09/06/2011 0902   BILITOT 0.6 09/08/2012 1645   BILITOT 0.40 09/03/2012 0918   BILITOT 0.70 09/06/2011 0902   GFRNONAA 58* 09/15/2012 0425   GFRAA 67* 09/15/2012 0425    CBG (last 3)   Basename 09/16/12 0748 09/15/12 2223 09/15/12 1656  GLUCAP 85 105* 92     Intake/Output Summary (Last 24 hours) at 09/16/12 1156 Last data filed at 09/16/12 0845  Gross per 24 hour  Intake   1310 ml  Output    250 ml  Net   1060 ml   Last BM - 10/29  Weight Status: No new weights  Estimated needs:   1700-1950 calories 95-115g protein  Nutrition Dx: Inadequate oral intake - resolved   Goal:  Pt to consume >90% of meals/supplements -  meeting mostly  Monitor: Intake   Levon Hedger MS, RD, LDN [phone] Pager [phone] After Hours Pager

## 2012-09-17 LAB — BASIC METABOLIC PANEL
Chloride: 105 mEq/L (ref 96–112)
Creatinine, Ser: 0.85 mg/dL (ref 0.50–1.10)
GFR calc Af Amer: 80 mL/min — ABNORMAL LOW (ref >90)
Potassium: 3.8 mEq/L (ref 3.5–5.1)

## 2012-09-17 LAB — CBC
MCV: 93.4 fL (ref 78.0–100.0)
Platelets: 87 10*3/uL — ABNORMAL LOW (ref 150–400)
RDW: 13.7 % (ref 11.5–15.5)
WBC: 3.7 10*3/uL — ABNORMAL LOW (ref 4.0–10.5)

## 2012-09-17 LAB — GLUCOSE, CAPILLARY: Glucose-Capillary: 95 mg/dL (ref 70–99)

## 2012-09-17 MED ORDER — MEGESTROL ACETATE 400 MG/10ML PO SUSP: 400.0000 mg | Freq: Every day | ORAL | Status: DC

## 2012-09-17 MED ORDER — ACETAMINOPHEN 325 MG PO TABS: 650.0000 mg | ORAL_TABLET | Freq: Four times a day (QID) | ORAL | Status: AC | PRN

## 2012-09-17 MED ORDER — IPRATROPIUM BROMIDE 0.02 % IN SOLN: 0.5000 mg | RESPIRATORY_TRACT | Status: DC | PRN

## 2012-09-17 MED ORDER — HEPARIN SOD (PORK) LOCK FLUSH 100 UNIT/ML IV SOLN
500.0000 [IU] | INTRAVENOUS | Status: AC | PRN
  Administered 2012-09-17: 500 [IU]

## 2012-09-17 MED ORDER — TIOTROPIUM BROMIDE MONOHYDRATE 18 MCG IN CAPS: 18.0000 ug | ORAL_CAPSULE | Freq: Every day | RESPIRATORY_TRACT | Status: AC

## 2012-09-17 MED ORDER — ALBUTEROL SULFATE (5 MG/ML) 0.5% IN NEBU: 2.5000 mg | INHALATION_SOLUTION | RESPIRATORY_TRACT | Status: AC | PRN

## 2012-09-17 MED ORDER — ENSURE COMPLETE PO LIQD: 237.0000 mL | Freq: Two times a day (BID) | ORAL | Status: DC

## 2012-09-17 MED ORDER — POTASSIUM CHLORIDE CRYS ER 20 MEQ PO TBCR: 20.0000 meq | EXTENDED_RELEASE_TABLET | Freq: Every day | ORAL | Status: AC

## 2012-09-17 NOTE — Progress Notes (Signed)
Patient cleared for discharge. Packet copied and placed in [facility]. Patient's daughter to transport. Creighton Longley C. Adalynn Corne MSW, LCSW [phone]  

## 2012-09-17 NOTE — Progress Notes (Signed)
Occupational Therapy Treatment Patient Details Name: BEYONCE SAWATZKY MRN: [id number] DOB: 1946/10/25 Today's Date: 09/17/2012 Time: 4098-1191 OT Time Calculation (min): 34 min  OT Assessment / Plan / Recommendation Comments on Treatment Session Pt may be discharging to SNF today. Pt making progress and did well with dressing task today. Reinforced/reviewed energy conservation techniques to utilize during ADL.    Follow Up Recommendations  Per chart, plan is for SNF--pt now agreeable. Feel she would benefit from SNF. If for some reason, she discharges home would need 24/7 and Home health OT   Barriers to Discharge       Equipment Recommendations  3 in 1 bedside comode    Recommendations for Other Services    Frequency Min 2X/week   Plan Discharge plan needs to be updated    Precautions / Restrictions Precautions Precautions: Fall        ADL  Lower Body Dressing: Performed;Minimal assistance;Other (comment) (don Depends and pj bottoms) Where Assessed - Lower Body Dressing: Supported sit to Pharmacist, hospital: Simulated;Supervision/safety;Other (comment) (min verbal cues) Toilet Transfer Method: Sit to stand ADL Comments: Pt not able to state energy conservation techniques but had handout in room. Reviewed several strategies with her including ones that were used during session today. Encouraged her to read handout on her own to help understand and utilize other strategies. Pt donned Depends and pj bottoms as well as top. She needed min assist to help pull up Depends in the back but did well with starting over her feet. Encouraged her to don Depends/underwear and pj bottoms both  before standing to pull up for energy conservation. Pt declined up to bathroom to brush teeth. She states she is too tired right now. She also declined up to chair stating it is uncomfortable in chair.     OT Diagnosis:    OT Problem List:   OT Treatment Interventions:     OT Goals ADL Goals ADL Goal:  Lower Body Dressing - Progress: Progressing toward goals ADL Goal: Toilet Transfer - Progress: Progressing toward goals Miscellaneous OT Goals OT Goal: Miscellaneous Goal #1 - Progress: Progressing toward goals  Visit Information  Last OT Received On: 09/17/12 Assistance Needed: +1    Subjective Data  Subjective: I am going to rehab  Patient Stated Goal: wants to put on her pants   Prior Functioning       Cognition  Overall Cognitive Status: Impaired Area of Impairment: Memory Arousal/Alertness: Awake/alert Behavior During Session: WFL for tasks performed Memory Deficits: noted some of the same information repeated during this session (I have COPD and I am going to rehab this afternoon)    Mobility  Shoulder Instructions Bed Mobility Bed Mobility: Supine to Sit Supine to Sit: 5: Supervision;HOB elevated;With rails Sit to Supine: 5: Supervision;HOB elevated;With rail Transfers Transfers: Sit to Stand;Stand to Sit Sit to Stand: 5: Supervision;With upper extremity assist;From bed Stand to Sit: 5: Supervision;With upper extremity assist;To bed Details for Transfer Assistance: min verbal cues for hand placement and safety       Exercises      Balance     End of Session OT - End of Session Activity Tolerance: Patient limited by fatigue;Other (comment) (although improved) Patient left: in bed;with call bell/phone within reach;with bed alarm set  GO     Lennox Laity [phone] 09/17/2012, 10:50 AM

## 2012-09-17 NOTE — Progress Notes (Signed)
CSW spoke with patients daughter. They are requesting [facility].  Bard Haupert C. Trinton Prewitt MSW, LCSW [phone]

## 2012-09-17 NOTE — Discharge Summary (Signed)
Physician Discharge Summary  Veronica Duarte WUJ:[id number] DOB: 07-Nov-1946 DOA: 09/08/2012  PCP: [name], MD  Admit date: 09/08/2012 Discharge date: 09/17/2012  Recommendations for Outpatient Follow-up:  Followup with [name], MD (PCP) in 1 week.  Followup with Dr. Arbutus Ped (oncology) in 1 - 2 weeks after discharge.  Please have CBC and BMET checked on 11/4 or 11/5, these labs can be done in Dr. Asa Lente clinic or at [facility]. Please have Dr. Arbutus Ped or PCP followup on the results.  Discharge Diagnoses:  Principal Problem:  *Fall Active Problems:  Type II or unspecified type diabetes mellitus with renal manifestations, uncontrolled(250.42)  HYPERLIPIDEMIA  DEMENTIA  DEPRESSION  HYPERTENSION  COPD  ADENOCARCINOMA, RIGHT LUNG, UPPER LOBE  CAD (coronary artery disease)  PE (pulmonary embolism)  UTI (lower urinary tract infection)  Neutropenia  Encephalopathy  Dehydration  PNA (pneumonia)  Hypokalemia  Anemia  Rectal bleeding  Discharge Condition: Stable  Diet recommendation: Heart healthy diet.  Filed Weights   09/08/12 2300  Weight: 80 kg (176 lb 5.9 oz)    History of present illness:  On admission: "Veronica Duarte is a 66 y.o. female with prior h/o metastatic lung cancer s/p chemo, and wedge resection, COPD, her last chemo was last week, was brought to ED by EMS as her daughter and son in law found her on the floor. Patient at baseline dementia, today appears to be more confused than usual. She reports that she tried to get out of bed to go to the bathroom and fell down. She denies any dizziness or LOC. She denies any chest pain or shortness of breath. She reports worsening cough.she reports right hip pain and right flank pain. Patient;s daughter reports she has been having loose bowel movements over the last 2 to 3 days. She was recently diagnosed with DVT and bilateral PE and was started on arixtra. Further evaluation in ED shows mild hyponatremia, neutropenia, and  UTI. She is being admitted to hospitalist service for evaluation and management of UTI."  Hospital Course:  Fall  Likely secondary to mechanical fall secondary to dehydration, urinary tract infection, plus or minus pneumonia. CT of the head was negative. Urinalysis consistent with a UTI. Urine cultures 35,000 colonies of Klebsiella pneumonia. PT/OT. Completed course of antibiotics.   Pneumonia  Patient does have a history of metastatic lung cancer status post chemotherapy recently. Patient had neutropenia with a white count of 1.0 which is improving and now 3.7. Patient also presented with confusion. Patient's pneumonia is a healthcare associated pneumonia secondary to immunocompromised state. Patient completed 8 day course of antibiotic.  Rectal bleeding  No further rectal bleeding. GI has been consulted. GI felt bleeding was perirectal and perineal bleeding. They recommended skin care consult and perineal skin care. Anusol has been added to patient's regimen, which patient completed 3 day course. Patient's last episode of rectal bleeding was atleast 5 nights prior to discharge. Dr. Loreta Ave was informed and is following along. If the patient had any further bleeding, GI was going to arrange for colonoscopy which the patient did not need as she did not have a recurrence of bleeding.  Urinary tract infection  Urine cultures and 35,000 colonies of Klebsiella pneumonia sensitive to ciprofloxacin. Patient completed 8 day course of antibiotics.   Acute encephalopathy  Likely secondary to urinary tract infection and probable pneumonia. Improved during the hospital stay. Currently close to baseline.    History of bilateral PE and DVT  Continue Fondaparinux. Monitor closely with rectal bleeding. Monitor  with thrombocytopenia.   History of metastatic lung cancer  Per oncology. Patient's oncologist is aware of patient's admission.   Right hip pain  Plain films of the right hip on 09/09/2012 are negative.  Continue pain management.   Diarrhea  C. difficile PCR is negative. Imodium PRN. Follow.   Neutropenia/anemia/thrombocytopenia (pancytopenia) Likely secondary to recent chemotherapy therapy in the setting of urinary tract infection and possible pneumonia. Blood cultures pending. Chest x-ray consistent with a pneumonia. Urine cultures with 35,000 colonies of Klebsiella pneumonia. WBC improved after a dose of Neupogen.WBC fluctuating. Platelets slowly trending down.Hgb at 7.7. Transfused 1 unit PRBC on 09/15/2012. Will monitor closely. Oncology following. Transfusion threshold hemoglobin less than 8.   COPD  Stable. Nebs as needed. Oxygen.    Obstructive sleep apnea  CPAP each bedtime.   Hypokalemia  Repleted. Start on daily potasium.   Protein calorie malnutrition Continue ensure and megace.  Prophylaxis  Arixtra for DVT prophylaxis.   DC to SNF.  Consultants:  Gastroenterology: Dr. Juanda Chance 09/11/2012 (Dr. Loreta Ave on 09/12/2012).  Procedures:  CT head 09/09/2012  Chest x-ray 09/09/2012  Plain films of the right hip 09/09/2012  1 unit PRBC 09/15/12  Antibiotics:  IV Zosyn 09/08/2012---> 09/14/12  IV vancomycin 09/09/2012---->09/12/12  Oral Levaquin 09/14/12 ---> 09/16/2012  Discharge Exam: Filed Vitals:   09/16/12 2200 09/17/12 0606 09/17/12 0819 09/17/12 1035  BP: 120/67 140/87    Pulse: 85 86    Temp: 99.2 F (37.3 C) 98.6 F (37 C)    TempSrc: Oral Oral    Resp: 20 20    Height:      Weight:      SpO2: 94% 94% 96% 95%    Discharge Instructions  Discharge Orders    Future Appointments: Provider: Department: Dept Phone: Center:   09/23/2012 10:00 AM Etta Grandchild, MD [facility] [phone] [facility]   09/24/2012 9:15 AM Windell Hummingbird [facility] [phone] None   09/24/2012 9:45 AM Conni Slipper, PA [facility] [phone] None   09/24/2012 10:45 AM [facility] [phone] None   10/14/2012 10:30 AM Radene Gunning  [facility] [phone] None   10/14/2012 11:00 AM [facility] [phone] None   11/05/2012 11:45 AM Radene Gunning [facility] [phone] None   11/05/2012 12:15 PM [facility] D11 [facility] [phone] None   11/26/2012 11:45 AM Radene Gunning [facility] [phone] None     Future Orders Please Complete By Expires   Diet - low sodium heart healthy      Increase activity slowly      Discharge instructions      Comments:   Followup with [name], MD (PCP) in 1 week.  Followup with Dr. Arbutus Ped (oncology) in 1 - 2 weeks after discharge.  Please have CBC and BMET checked on 11/4 or 11/5, these labs can be done in Dr. Asa Lente clinic or at [facility]. Please have Dr. Arbutus Ped or PCP followup on the results.       Medication List     As of 09/17/2012 12:00 PM    STOP taking these medications         nitrofurantoin (macrocrystal-monohydrate) 100 MG capsule   Commonly known as: MACROBID      TAKE these medications         acetaminophen 325 MG tablet   Commonly known as: TYLENOL   Take 2 tablets (650 mg total) by mouth every 6 (six) hours as needed (or Fever >/= 101).  Aclidinium Bromide 400 MCG/ACT Aepb   Inhale 1 puff into the lungs 2 (two) times daily.      albuterol 108 (90 BASE) MCG/ACT inhaler   Commonly known as: PROVENTIL HFA;VENTOLIN HFA   Inhale 2 puffs into the lungs every 6 (six) hours as needed. For shortness of breath      albuterol (5 MG/ML) 0.5% nebulizer solution   Commonly known as: PROVENTIL   Take 0.5 mLs (2.5 mg total) by nebulization every 4 (four) hours as needed for wheezing or shortness of breath.      buPROPion 150 MG 24 hr tablet   Commonly known as: WELLBUTRIN XL   Take 1 tablet (150 mg total) by mouth daily.      BYSTOLIC 20 MG Tabs   Generic drug: Nebivolol HCl   Take 1 tablet (20 mg total) by mouth daily.      cetirizine 10 MG tablet   Commonly known as: ZYRTEC   Take 1 tablet (10 mg  total) by mouth daily as needed. For allergies      dexamethasone 4 MG tablet   Commonly known as: DECADRON   Take 4 mg by mouth See admin instructions. 1 tab BID day before, during, and after chemo      diltiazem 120 MG 24 hr capsule   Commonly known as: CARDIZEM CD   Take 1 capsule (120 mg total) by mouth daily.      DULoxetine 60 MG capsule   Commonly known as: CYMBALTA   Take 1 capsule (60 mg total) by mouth daily.      ezetimibe 10 MG tablet   Commonly known as: ZETIA   Take 1 tablet (10 mg total) by mouth daily.      feeding supplement Liqd   Take 237 mLs by mouth 2 (two) times daily between meals.      ferrous sulfate 325 (65 FE) MG tablet   Take 1 tablet (325 mg total) by mouth daily with breakfast.      folic acid 1 MG tablet   Commonly known as: FOLVITE   Take 1 tablet (1 mg total) by mouth daily.      fondaparinux 7.5 MG/0.6ML Soln   Commonly known as: ARIXTRA   Inject 0.6 mLs (7.5 mg total) into the skin daily.      guaiFENesin 600 MG 12 hr tablet   Commonly known as: MUCINEX   Take 600 mg by mouth 2 (two) times daily. For congestion      ipratropium 0.02 % nebulizer solution   Commonly known as: ATROVENT   Take 2.5 mLs (0.5 mg total) by nebulization every 4 (four) hours as needed.      lidocaine-prilocaine cream   Commonly known as: EMLA   Apply 1 application topically as needed. Apply to port 1 hour before chemo appointment      magnesium oxide 400 (241.3 MG) MG tablet   Commonly known as: MAG-OX   Take 400-800 mg by mouth 2 (two) times daily. Takes 2 tablets in the morning and 1 tablet at night      megestrol 400 MG/10ML suspension   Commonly known as: MEGACE   Take 10 mLs (400 mg total) by mouth daily.      multivitamin with minerals Tabs   Take 1 tablet by mouth daily.      NAMENDA XR 14 MG Cp24   Generic drug: Memantine HCl ER   Take 1 tablet by mouth daily.      niacin 1000  MG CR tablet   Commonly known as: NIASPAN   Take 1,000 mg by  mouth at bedtime.      potassium chloride SA 20 MEQ tablet   Commonly known as: K-DUR,KLOR-CON   Take 1 tablet (20 mEq total) by mouth daily.      PRESCRIPTION MEDICATION   Inject into the vein every 21 ( twenty-one) days. Alimta and Carboplatin every 3 weeks.      prochlorperazine 10 MG tablet   Commonly known as: COMPAZINE   Take 1 tablet (10 mg total) by mouth every 6 (six) hours as needed. For nausea      rosuvastatin 20 MG tablet   Commonly known as: CRESTOR   Take 20 mg by mouth daily.      SYMBICORT 160-4.5 MCG/ACT inhaler   Generic drug: budesonide-formoterol   INHALE 2 PUFFS INTO THE LUNGS 2 TIMES   DAILY      tiotropium 18 MCG inhalation capsule   Commonly known as: SPIRIVA   Place 1 capsule (18 mcg total) into inhaler and inhale daily.      vitamin C 500 MG tablet   Commonly known as: ASCORBIC ACID   Take 500 mg by mouth 2 (two) times daily.          The results of significant diagnostics from this hospitalization (including imaging, microbiology, ancillary and laboratory) are listed below for reference.    Significant Diagnostic Studies: Dg Chest 2 View  09/09/2012  *RADIOLOGY REPORT*  Clinical Data: Shortness of breath.  Worsening cough.  Right hip pain.  History diabetes.  COPD.  Ex-smoker.  CHEST - 2 VIEW  Comparison: Plain film of 1 day prior.  CT of 08/10/2012.  Findings: Lateral view degraded by patient arm position.  Prior median sternotomy.  Hyperinflation.  Left-sided Port-A-Cath which terminates at the mid SVC. Normal heart size.  Right hilar soft tissue fullness, likely corresponding adenopathy on recent CT. No pleural effusion or pneumothorax. Patchy right base air space disease is not significantly changed.  IMPRESSION: Patchy right base air space disease.  Suspicious for infection, especially given the appearance on 08/10/2012 CT. Recommend radiographic follow-up until clearing.  Right hilar adenopathy, as detailed on 08/10/2012 CT.  COPD/emphysema.    Original Report Authenticated By: Consuello Bossier, M.D.    Dg Chest 2 View  09/01/2012  *RADIOLOGY REPORT*  Clinical Data: Cough, pneumonia, lung cancer  CHEST - 2 VIEW  Comparison: 08/10/2012 and 04/17/2012  Findings: Cardiomediastinal silhouette is stable.  Left subclavian Port-A-Cath with tip in SVC.  No acute infiltrate or pulmonary edema.  A nodule in the right upper lobe measures 12.2 mm stable from 08/10/2012.  Postsurgical changes are noted in the right upper lobe.  Stable degenerative changes thoracic spine.  IMPRESSION: No active disease.  Stable nodule in the right upper lobe. Postsurgical changes in the right upper lobe.   Original Report Authenticated By: Natasha Mead, M.D.    Dg Hip Complete Right  09/09/2012  *RADIOLOGY REPORT*  Clinical Data: Hip pain for 2 days.  History diabetes.  COPD.  RIGHT HIP - COMPLETE 2+ VIEW  Comparison: CT of 08/10/2012  Findings: Femoral heads are located.  Mild osteopenia.  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 No acute fracture.  Surgical clips in the right inguinal region.  IMPRESSION: Degenerative change, without acute osseous finding.   Original Report Authenticated By: Consuello Bossier, M.D.    Ct Head Wo Contrast  09/09/2012  *  RADIOLOGY REPORT*  Clinical Data: Lung ca w/ liver mets; chemo ongoing; found on floor 09/08/12; increased confusion; hx aaa; renal artery stenosis; sickle cell anemia; dvt; unable to walk.  CT HEAD WITHOUT CONTRAST  Technique:  Contiguous axial images were obtained from the base of the skull through the vertex without contrast.  Comparison: 03/06/2012  Findings: Suspected small remote left inferior cerebellar lacunar infarct, image 7 of series 2. Periventricular and corona radiata white matter hypodensities are most compatible with chronic ischemic microvascular white matter disease.  Otherwise, the brain stem, cerebellum, cerebral peduncles, thalami, basal ganglia, basilar  cisterns, and ventricular system appear unremarkable.  No intracranial hemorrhage, mass lesion, or acute infarction is identified.  The visualized paranasal sinuses appear clear.  Atherosclerotic calcification of the carotid siphons noted.  IMPRESSION:  1. Periventricular and corona radiata white matter hypodensities are most compatible with chronic ischemic microvascular white matter disease. 2.  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 3.  No noncontrast findings of intracranial metastatic disease. Please note that MRI with and without contrast offers greater diagnostic sensitivity and specificity for small intracranial metastatic lesions.   Original Report Authenticated By: Dellia Cloud, M.D.    Dg Chest Port 1 View  09/08/2012  *RADIOLOGY REPORT*  Clinical Data: Altered mental status  PORTABLE CHEST - 1 VIEW  Comparison: 09/01/2012  Findings: Cardiomediastinal silhouette is stable.  Status post median sternotomy.  Stable left Port-A-Cath position.  Stable nodule in the right upper lobe. No acute infiltrate or pulmonary edema.  IMPRESSION: Status post median sternotomy. No active disease.  Stable nodule in the right upper lobe.   Original Report Authenticated By: Natasha Mead, M.D.     Microbiology: Recent Results (from the past 240 hour(s))  URINE CULTURE     Status: Normal   Collection Time   09/08/12  5:15 PM      Component Value Range Status Comment   Specimen Description URINE, CLEAN CATCH   Final    Special Requests NONE   Final    Culture  Setup Time 09/09/2012 03:16   Final    Colony Count 35,000 COLONIES/ML   Final    Culture KLEBSIELLA PNEUMONIAE   Final    Report Status 09/10/2012 FINAL   Final    Organism ID, Bacteria KLEBSIELLA PNEUMONIAE   Final   CLOSTRIDIUM DIFFICILE BY PCR     Status: Normal   Collection Time   09/09/12  1:15 AM      Component Value Range Status Comment   C difficile by pcr NEGATIVE  NEGATIVE Final   CULTURE, BLOOD (ROUTINE X  2)     Status: Normal   Collection Time   09/09/12  4:00 PM      Component Value Range Status Comment   Specimen Description BLOOD RIGHT HAND   Final    Special Requests BOTTLES DRAWN AEROBIC AND ANAEROBIC 10CC   Final    Culture  Setup Time 09/09/2012 23:20   Final    Culture NO GROWTH 5 DAYS   Final    Report Status 09/15/2012 FINAL   Final   CULTURE, BLOOD (ROUTINE X 2)     Status: Normal   Collection Time   09/09/12  4:20 PM      Component Value Range Status Comment   Specimen Description BLOOD RIGHT ARM   Final    Special Requests BOTTLES DRAWN AEROBIC AND ANAEROBIC 10CC   Final    Culture  Setup Time 09/09/2012 23:20  Final    Culture NO GROWTH 5 DAYS   Final    Report Status 09/15/2012 FINAL   Final      Labs: Basic Metabolic Panel:  Lab 09/17/12 5409 09/15/12 0425 09/14/12 0450 09/13/12 0315 09/12/12 0447 09/11/12 0415  NA 137 138 139 139 136 --  K 3.8 3.6 3.5 4.2 4.4 --  CL 105 105 104 105 105 --  CO2 22 22 22 21 20  --  GLUCOSE 106* 89 74 88 101* --  BUN 13 10 3* 4* 5* --  CREATININE 0.85 0.98 0.70 0.79 0.73 --  CALCIUM 9.1 8.8 9.0 9.4 8.8 --  MG -- -- -- 1.6 1.8 1.8  PHOS -- -- -- -- -- --   Liver Function Tests: No results found for this basename: AST:5,ALT:5,ALKPHOS:5,BILITOT:5,PROT:5,ALBUMIN:5 in the last 168 hours No results found for this basename: LIPASE:5,AMYLASE:5 in the last 168 hours No results found for this basename: AMMONIA:5 in the last 168 hours CBC:  Lab 09/17/12 0400 09/15/12 1550 09/15/12 0425 09/14/12 0450 09/13/12 0315 09/11/12 0415  WBC 3.7* 3.1* 2.6* 2.9* 4.9 --  NEUTROABS -- 1.4* 1.2* -- -- 1.7  HGB 9.0* 9.1* 7.7* 8.0* 8.5* --  HCT 27.0* 26.2* 22.7* 23.6* 26.1* --  MCV 93.4 91.6 92.7 93.7 94.6 --  PLT 87* 68* 70* 60* 74* --   Cardiac Enzymes: No results found for this basename: CKTOTAL:5,CKMB:5,CKMBINDEX:5,TROPONINI:5 in the last 168 hours BNP: BNP (last 3 results) No results found for this basename: PROBNP:3 in the last 8760  hours CBG:  Lab 09/17/12 0745 09/16/12 2217 09/16/12 1703 09/16/12 1219 09/16/12 0748  GLUCAP 95 117* 131* 94 85    Time spent: 35 minutes  Signed:  Man Bonneau A  [facility] 09/17/2012, 12:00 PM

## 2012-09-17 NOTE — Progress Notes (Signed)
Subjective: No specific complaints. Denies any chest pain or shortness of breath. Breathing better.  Objective: Vital signs in last 24 hours: Filed Vitals:   09/16/12 1949 09/16/12 2200 09/17/12 0606 09/17/12 0819  BP:  120/67 140/87   Pulse:  85 86   Temp:  99.2 F (37.3 C) 98.6 F (37 C)   TempSrc:  Oral Oral   Resp:  20 20   Height:      Weight:      SpO2: 96% 94% 94% 96%   Weight change:  No intake or output data in the 24 hours ending 09/17/12 0900  Physical Exam: General: Awake, Oriented, No acute distress. HEENT: EOMI. Neck: Supple CV: S1 and S2 Lungs: Clear to ascultation bilaterally, crackles at bases. Abdomen: Soft, Nontender, Nondistended, +bowel sounds. Ext: Good pulses. Trace edema.  Lab Results: Basic Metabolic Panel:  Lab 09/17/12 4098 09/15/12 0425 09/14/12 0450 09/13/12 0315 09/12/12 0447 09/11/12 0415  NA 137 138 139 139 136 --  K 3.8 3.6 3.5 4.2 4.4 --  CL 105 105 104 105 105 --  CO2 22 22 22 21 20  --  GLUCOSE 106* 89 74 88 101* --  BUN 13 10 3* 4* 5* --  CREATININE 0.85 0.98 0.70 0.79 0.73 --  CALCIUM 9.1 8.8 9.0 9.4 8.8 --  MG -- -- -- 1.6 1.8 1.8  PHOS -- -- -- -- -- --   Liver Function Tests: No results found for this basename: AST:5,ALT:5,ALKPHOS:5,BILITOT:5,PROT:5,ALBUMIN:5 in the last 168 hours No results found for this basename: LIPASE:5,AMYLASE:5 in the last 168 hours No results found for this basename: AMMONIA:5 in the last 168 hours CBC:  Lab 09/17/12 0400 09/15/12 1550 09/15/12 0425 09/14/12 0450 09/13/12 0315 09/11/12 0415  WBC 3.7* 3.1* 2.6* 2.9* 4.9 --  NEUTROABS -- 1.4* 1.2* -- -- 1.7  HGB 9.0* 9.1* 7.7* 8.0* 8.5* --  HCT 27.0* 26.2* 22.7* 23.6* 26.1* --  MCV 93.4 91.6 92.7 93.7 94.6 --  PLT 87* 68* 70* 60* 74* --   Cardiac Enzymes: No results found for this basename: CKTOTAL:5,CKMB:5,CKMBINDEX:5,TROPONINI:5 in the last 168 hours BNP (last 3 results) No results found for this basename: PROBNP:3 in the last 8760  hours CBG:  Lab 09/17/12 0745 09/16/12 2217 09/16/12 1703 09/16/12 1219 09/16/12 0748  GLUCAP 95 117* 131* 94 85   No results found for this basename: HGBA1C:5 in the last 72 hours Other Labs: No components found with this basename: POCBNP:3 No results found for this basename: DDIMER:2 in the last 168 hours No results found for this basename: CHOL:2,HDL:2,LDLCALC:2,TRIG:2,CHOLHDL:2,LDLDIRECT:2 in the last 168 hours No results found for this basename: TSH,T4TOTAL,FREET3,T3FREE,FREET4,THYROIDAB in the last 168 hours No results found for this basename: VITAMINB12:2,FOLATE:2,FERRITIN:2,TIBC:2,IRON:2,RETICCTPCT:2 in the last 168 hours  Micro Results: Recent Results (from the past 240 hour(s))  URINE CULTURE     Status: Normal   Collection Time   09/08/12  5:15 PM      Component Value Range Status Comment   Specimen Description URINE, CLEAN CATCH   Final    Special Requests NONE   Final    Culture  Setup Time 09/09/2012 03:16   Final    Colony Count 35,000 COLONIES/ML   Final    Culture KLEBSIELLA PNEUMONIAE   Final    Report Status 09/10/2012 FINAL   Final    Organism ID, Bacteria KLEBSIELLA PNEUMONIAE   Final   CLOSTRIDIUM DIFFICILE BY PCR     Status: Normal   Collection Time   09/09/12  1:15  AM      Component Value Range Status Comment   C difficile by pcr NEGATIVE  NEGATIVE Final   CULTURE, BLOOD (ROUTINE X 2)     Status: Normal   Collection Time   09/09/12  4:00 PM      Component Value Range Status Comment   Specimen Description BLOOD RIGHT HAND   Final    Special Requests BOTTLES DRAWN AEROBIC AND ANAEROBIC 10CC   Final    Culture  Setup Time 09/09/2012 23:20   Final    Culture NO GROWTH 5 DAYS   Final    Report Status 09/15/2012 FINAL   Final   CULTURE, BLOOD (ROUTINE X 2)     Status: Normal   Collection Time   09/09/12  4:20 PM      Component Value Range Status Comment   Specimen Description BLOOD RIGHT ARM   Final    Special Requests BOTTLES DRAWN AEROBIC AND  ANAEROBIC 10CC   Final    Culture  Setup Time 09/09/2012 23:20   Final    Culture NO GROWTH 5 DAYS   Final    Report Status 09/15/2012 FINAL   Final     Studies/Results: No results found.  Medications: I have reviewed the patient's current medications. Scheduled Meds:    . atorvastatin  20 mg Oral q1800  . budesonide-formoterol  2 puff Inhalation BID  . buPROPion  150 mg Oral Daily  . diltiazem  120 mg Oral Daily  . DULoxetine  60 mg Oral Daily  . ezetimibe  10 mg Oral Daily  . feeding supplement  237 mL Oral BID BM  . ferrous sulfate  325 mg Oral Q breakfast  . folic acid  1 mg Oral Daily  . fondaparinux  7.5 mg Subcutaneous QHS  . insulin aspart  0-9 Units Subcutaneous TID WC  . magnesium oxide  400 mg Oral QHS  . magnesium oxide  800 mg Oral Daily  . megestrol  400 mg Oral Daily  . memantine  10 mg Oral Daily  . ondansetron (ZOFRAN) IV  4 mg Intravenous Once  . potassium chloride  40 mEq Oral Daily  . tiotropium  18 mcg Inhalation Daily  . DISCONTD: levofloxacin  750 mg Oral Daily   Continuous Infusions:  PRN Meds:.acetaminophen, acetaminophen, acetaminophen, albuterol, albuterol, ipratropium, loperamide, ondansetron (ZOFRAN) IV, ondansetron, sodium chloride  Assessment/Plan: Fall  Likely secondary to mechanical fall secondary to dehydration, urinary tract infection, plus or minus pneumonia. CT of the head was negative. Urinalysis consistent with a UTI. Urine cultures 35,000 colonies of Klebsiella pneumonia. PT/OT. Completed course of antibiotics.   Pneumonia  Patient does have a history of metastatic lung cancer status post chemotherapy recently. Patient had neutropenia with a white count of 1.0 which is improving and now 3.7. Patient also presented with confusion. Patient's pneumonia is a healthcare associated pneumonia secondary to immunocompromised state. Patient completed 8 day course of antibiotic.  Rectal bleeding  No further rectal bleeding. GI has been  consulted. A GI likely feels perirectal and perineal bleeding. They recommended skin care consult and perineal skin care. Anusol has been added to patient's regimen. Patient with an episode of rectal bleeding 5 nights ago. Dr. Loreta Ave was informed and is following along. If the patient had any further bleeding, GI was going to arrange for colonoscopy which the patient did not need.  Urinary tract infection  Urine cultures and 35,000 colonies of Klebsiella pneumonia sensitive to ciprofloxacin. Patient completed 8 day course  of antibiotics.   Acute encephalopathy  Likely secondary to urinary tract infection and probable pneumonia. Slowly improving clinically. Currently close to baseline. Continue IV fluids. Continue oral Levaquin. Follow.   History of bilateral PE and DVT  Continue Fondaparinux. Monitor closely with rectal bleeding. Monitor with thrombocytopenia.   History of metastatic lung cancer  Per oncology. Patient's oncologist is aware of patient's admission.   Right hip pain  Plain films of the right hip on 09/09/2012 are negative. Continue pain management.   Diarrhea  C. difficile PCR is negative. Imodium PRN. Follow.   Neutropenia/anemia/thrombocytopenia (pancytopenia) Likely secondary to recent chemotherapy therapy in the setting of urinary tract infection and possible pneumonia. Blood cultures pending. Chest x-ray consistent with a pneumonia. Urine cultures with 35,000 colonies of Klebsiella pneumonia. WBC improved after a dose of Neupogen.WBC fluctuating. Platelets slowly trending down.Hgb at 7.7. Transfused 1 unit PRBC on 09/15/2012. Will monitor closely. Oncology following. Transfusion threshold hemoglobin less than 8.   COPD  Stable. Nebs as needed. Oxygen.    Obstructive sleep apnea  CPAP each bedtime.   Hypokalemia  Repleted. Start on daily potasium.   Protein calorie malnutrition Continue ensure and megace.  Prophylaxis  On Arixtra for DVT prophylaxis.   Code  Status: full  Family Communication: Updated patient. Daughter updated on telephone.  Disposition Plan: DC to SNF today.   Consultants:  Gastroenterology: Dr. Juanda Chance 09/11/2012 (Dr. Loreta Ave on 09/12/2012).  Procedures:  CT head 09/09/2012  Chest x-ray 09/09/2012  Plain films of the right hip 09/09/2012  1 unit PRBC 09/15/12  Antibiotics:  IV Zosyn 09/08/2012---> 09/14/12  IV vancomycin 09/09/2012---->09/12/12  Oral Levaquin 09/14/12 ---> 09/16/2012   LOS: 9 days  Nikolai Wilczak A, MD 09/17/2012, 9:00 AM

## 2012-09-21 ENCOUNTER — Telehealth: Payer: Self-pay | Admitting: Medical Oncology

## 2012-09-21 NOTE — Telephone Encounter (Signed)
Veronica Duarte is out of hospital and temporarily resides in [facility] and nursing facility . She is due for chemo Thursday-does she still come in for appts? Daughter discovered that her mother did not get arixtra on 09/17/12 -09/19/12 at rehab facility. She also reports her mothers right ankle is swollen and warm . She will talk to Director of nursing home . She stated there is a high probability she has a clot and if so it will not change her treatment. I will consult with Dr Arbutus Ped.

## 2012-09-21 NOTE — Telephone Encounter (Signed)
Per Dr Arbutus Ped cancel chemo this week. Arixtra needs to be restarted.I called daughter adn told her this , but to keep other appts this week.

## 2012-09-23 ENCOUNTER — Ambulatory Visit: Payer: Medicare Other | Admitting: Internal Medicine

## 2012-09-24 ENCOUNTER — Ambulatory Visit (HOSPITAL_BASED_OUTPATIENT_CLINIC_OR_DEPARTMENT_OTHER): Payer: Medicare Other | Admitting: Lab

## 2012-09-24 ENCOUNTER — Other Ambulatory Visit (HOSPITAL_BASED_OUTPATIENT_CLINIC_OR_DEPARTMENT_OTHER): Payer: Medicare Other | Admitting: Lab

## 2012-09-24 ENCOUNTER — Encounter: Payer: Self-pay | Admitting: Physician Assistant

## 2012-09-24 ENCOUNTER — Ambulatory Visit: Payer: Medicare Other

## 2012-09-24 ENCOUNTER — Ambulatory Visit (HOSPITAL_BASED_OUTPATIENT_CLINIC_OR_DEPARTMENT_OTHER): Payer: Medicare Other | Admitting: Physician Assistant

## 2012-09-24 VITALS — BP 134/64 | HR 82 | Temp 98.5°F | Resp 20 | Ht 65.0 in | Wt 177.5 lb

## 2012-09-24 DIAGNOSIS — C3410 Malignant neoplasm of upper lobe, unspecified bronchus or lung: Secondary | ICD-10-CM

## 2012-09-24 DIAGNOSIS — I82409 Acute embolism and thrombosis of unspecified deep veins of unspecified lower extremity: Secondary | ICD-10-CM

## 2012-09-24 DIAGNOSIS — Z7901 Long term (current) use of anticoagulants: Secondary | ICD-10-CM

## 2012-09-24 DIAGNOSIS — I2699 Other pulmonary embolism without acute cor pulmonale: Secondary | ICD-10-CM

## 2012-09-24 DIAGNOSIS — C3490 Malignant neoplasm of unspecified part of unspecified bronchus or lung: Secondary | ICD-10-CM

## 2012-09-24 LAB — MAGNESIUM (CC13): Magnesium: 1.9 mg/dl (ref 1.5–2.5)

## 2012-09-24 LAB — CBC WITH DIFFERENTIAL/PLATELET
Basophils Absolute: 0 10*3/uL (ref 0.0–0.1)
EOS%: 1.2 % (ref 0.0–7.0)
HGB: 10.9 g/dL — ABNORMAL LOW (ref 11.6–15.9)
LYMPH%: 35.7 % (ref 14.0–49.7)
MCH: 31.1 pg (ref 25.1–34.0)
MCV: 98.3 fL (ref 79.5–101.0)
MONO%: 14.3 % — ABNORMAL HIGH (ref 0.0–14.0)
NEUT%: 48.5 % (ref 38.4–76.8)
Platelets: 271 10*3/uL (ref 145–400)
RDW: 16.3 % — ABNORMAL HIGH (ref 11.2–14.5)

## 2012-09-24 LAB — COMPREHENSIVE METABOLIC PANEL (CC13)
ALT: 24 U/L (ref 0–55)
AST: 23 U/L (ref 5–34)
Albumin: 3.3 g/dL — ABNORMAL LOW (ref 3.5–5.0)
Alkaline Phosphatase: 56 U/L (ref 40–150)
BUN: 13 mg/dL (ref 7.0–26.0)
CO2: 23 mEq/L (ref 22–29)
Calcium: 9.7 mg/dL (ref 8.4–10.4)
Chloride: 111 mEq/L — ABNORMAL HIGH (ref 98–107)
Creatinine: 0.8 mg/dL (ref 0.6–1.1)
Glucose: 88 mg/dl (ref 70–99)
Potassium: 3.6 mEq/L (ref 3.5–5.1)
Sodium: 140 mEq/L (ref 136–145)
Total Bilirubin: 0.44 mg/dL (ref 0.20–1.20)
Total Protein: 6.3 g/dL — ABNORMAL LOW (ref 6.4–8.3)

## 2012-09-25 ENCOUNTER — Telehealth: Payer: Self-pay | Admitting: *Deleted

## 2012-09-25 NOTE — Telephone Encounter (Signed)
Made patient appointment for 10-08-2012 starting at 11:00am with labs   Sent michelle email to set up the patient's treatment  Left voice message to inform the patient of the new date and time on 10-08-2012

## 2012-09-30 NOTE — Progress Notes (Signed)
[facility] [phone]   [phone]  OFFICE PROGRESS NOTE  [name], MD [address] [facility] [address] [place], 1st [address] [place] [address]  PRINCIPAL DIAGNOSIS:  1) Metastatic non-small cell lung cancer, adenocarcinoma initially diagnosed as stage IB (T2a, N0, N0) in March 2012.  2) bilateral pulmonary emboli and deep venous thrombosis of the left lower extremity diagnosed on 06/03/2012   PRIOR THERAPY:  1. Status post wedge resection of the right upper lobe under the care of Dr. Laneta Simmers on February 14, 2011.  2. Lovenox 80 mg subcutaneous twice a day. First dose today. 3. Systemic chemotherapy with carboplatin for AUC of 5 and Alimta 500 mg/M2 every 3 weeks, the patient is status post 6 cycles. Current dose of Alimta is 375 mg per meter squared   CURRENT THERAPY:  1) Maintenance Alimta 500 mg/M2 every 3 weeks, status post 2 cycles .  2) Arixtra 7.5 mg subcutaneously daily.  INTERVAL HISTORY: MACIEL KEGG 66 y.o. female returns to the clinic today for followup visit accompanied by her 2 daughters. The patient is feeling fine today with the exception of fatigue. She is currently in a skilled nursing facility for rehabilitation. Per her daughter she sustained another fall while participating in her physical therapy. There is no loss of consciousness. She continues to have a cough that is nonproductive and not associated with any fever or chills. She remains somewhat weak is not quite up to proceeding with chemotherapy today as scheduled. There had been an apparent misunderstanding regarding patient's Arixtra however per the daughter this has been resolved and she has been receiving it daily since the mix up has been resolved. She denied having any significant nausea or vomiting, no fever or chills, no significant weight loss or night sweats. The patient denied having any significant chest pain, shortness breath, cough or hemoptysis.  MEDICAL  HISTORY: Past Medical History  Diagnosis Date  . Diabetes mellitus     type 2- metformin  . Depression   . RAS (renal artery stenosis)   . AAA (abdominal aortic aneurysm)   . Osteoarthritis   . COPD (chronic obstructive pulmonary disease)   . Hx of colonic polyps   . CAD (coronary artery disease)   . OSA (obstructive sleep apnea)     Uses CPAP pressure is 6 study was done > 5 years  . Hypertension     Sees Dr. Tresa Endo with [facility]  . ADENOCARCINOMA, RIGHT LUNG, UPPER LOBE 02/04/2011    ALLERGIES:   has no known allergies.  MEDICATIONS:  Current Outpatient Prescriptions  Medication Sig Dispense Refill  . acetaminophen (TYLENOL) 325 MG tablet Take 2 tablets (650 mg total) by mouth every 6 (six) hours as needed (or Fever >/= 101).      . Aclidinium Bromide (TUDORZA PRESSAIR) 400 MCG/ACT AEPB Inhale 1 puff into the lungs 2 (two) times daily.  1 each  6  . albuterol (PROVENTIL HFA;VENTOLIN HFA) 108 (90 BASE) MCG/ACT inhaler Inhale 2 puffs into the lungs every 6 (six) hours as needed. For shortness of breath      . albuterol (PROVENTIL) (5 MG/ML) 0.5% nebulizer solution Take 0.5 mLs (2.5 mg total) by nebulization every 4 (four) hours as needed for wheezing or shortness of breath.  20 mL    . buPROPion (WELLBUTRIN XL) 150 MG 24 hr tablet Take 1 tablet (150 mg total) by mouth daily.  90 tablet  3  . cetirizine (ZYRTEC) 10  MG tablet Take 1 tablet (10 mg total) by mouth daily as needed. For allergies  90 tablet  3  . dexamethasone (DECADRON) 4 MG tablet Take 4 mg by mouth See admin instructions. 1 tab BID day before, during, and after chemo      . diltiazem (CARDIZEM CD) 120 MG 24 hr capsule Take 1 capsule (120 mg total) by mouth daily.  90 capsule  3  . DULoxetine (CYMBALTA) 60 MG capsule Take 1 capsule (60 mg total) by mouth daily.  90 capsule  3  . ezetimibe (ZETIA) 10 MG tablet Take 1 tablet (10 mg total) by mouth daily.  90 tablet  3  . feeding supplement (ENSURE COMPLETE) LIQD Take 237 mLs by  mouth 2 (two) times daily between meals.      . ferrous sulfate 325 (65 FE) MG tablet Take 1 tablet (325 mg total) by mouth daily with breakfast.  90 tablet  3  . folic acid (FOLVITE) 1 MG tablet Take 1 tablet (1 mg total) by mouth daily.  90 tablet  3  . fondaparinux (ARIXTRA) 7.5 MG/0.6ML SOLN Inject 0.6 mLs (7.5 mg total) into the skin daily.  90 Syringe  0  . guaiFENesin (MUCINEX) 600 MG 12 hr tablet Take 600 mg by mouth 2 (two) times daily. For congestion      . ipratropium (ATROVENT) 0.02 % nebulizer solution Take 2.5 mLs (0.5 mg total) by nebulization every 4 (four) hours as needed.  75 mL    . lidocaine-prilocaine (EMLA) cream Apply 1 application topically as needed. Apply to port 1 hour before chemo appointment      . magnesium oxide (MAG-OX) 400 (241.3 MG) MG tablet Take 400-800 mg by mouth 2 (two) times daily. Takes 2 tablets in the morning and 1 tablet at night      . megestrol (MEGACE) 400 MG/10ML suspension Take 10 mLs (400 mg total) by mouth daily.  240 mL    . Memantine HCl ER (NAMENDA XR) 14 MG CP24 Take 1 tablet by mouth daily.      . metFORMIN (GLUCOPHAGE) 500 MG tablet       . Multiple Vitamin (MULITIVITAMIN WITH MINERALS) TABS Take 1 tablet by mouth daily.      . Nebivolol HCl (BYSTOLIC) 20 MG TABS Take 1 tablet (20 mg total) by mouth daily.  30 tablet    . niacin (NIASPAN) 1000 MG CR tablet Take 1,000 mg by mouth at bedtime.       . potassium chloride SA (K-DUR,KLOR-CON) 20 MEQ tablet Take 1 tablet (20 mEq total) by mouth daily.      Marland Kitchen PRESCRIPTION MEDICATION Inject into the vein every 21 ( twenty-one) days. Alimta and Carboplatin every 3 weeks.      . prochlorperazine (COMPAZINE) 10 MG tablet Take 1 tablet (10 mg total) by mouth every 6 (six) hours as needed. For nausea  30 tablet  11  . rosuvastatin (CRESTOR) 20 MG tablet Take 20 mg by mouth daily.      . SYMBICORT 160-4.5 MCG/ACT inhaler INHALE 2 PUFFS INTO THE LUNGS 2 TIMES   DAILY  10.2 g  5  . tiotropium (SPIRIVA) 18  MCG inhalation capsule Place 1 capsule (18 mcg total) into inhaler and inhale daily.  30 capsule    . vitamin C (ASCORBIC ACID) 500 MG tablet Take 500 mg by mouth 2 (two) times daily.         SURGICAL HISTORY:  Past Surgical History  Procedure Date  .  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 Vesicovaginal fistula closure w/ tah   . Abdominal hysterectomy   . Right video-assisted thoracoscopy with wedge resection of 02/14/11    Dr Evelene Croon  . Cardiac catheterization 08/30/2009    Dr Aleen Campi  . Intraoperative transesophageal echocardiography. 08/30/2009    Dr Kipp Brood  . Mediastinoscopy 04/03/2012    Procedure: MEDIASTINOSCOPY;  Surgeon: Alleen Borne, MD;  Location: [facility] OR;  Service: Thoracic;  Laterality: N/A;  . Portacath placement 04/03/2012    Procedure: INSERTION PORT-A-CATH;  Surgeon: Alleen Borne, MD;  Location: [facility] OR;  Service: Thoracic;  Laterality: Left;  Insertion of 9.6Fr. Pre-attached power port in Left Subclavian    REVIEW OF SYSTEMS:  A comprehensive review of systems was negative except for: Constitutional: positive for fatigue   PHYSICAL EXAMINATION: General appearance: alert, cooperative, fatigued and no distress Head: Normocephalic, without obvious abnormality, atraumatic Neck: no adenopathy Lymph nodes: Cervical, supraclavicular, and axillary nodes normal. Resp: clear to auscultation bilaterally Cardio: regular rate and rhythm, S1, S2 normal, no murmur, click, rub or gallop GI: soft, non-tender; bowel sounds normal; no masses,  no organomegaly Extremities: extremities normal, atraumatic, no cyanosis or edema Neurologic: Alert and oriented X 3, normal strength and tone. Normal symmetric reflexes. Normal coordination and gait  ECOG PERFORMANCE STATUS: 2 - Symptomatic, <50% confined to bed  Blood pressure 134/64, pulse 82, temperature 98.5 F (36.9 C), temperature source Oral, resp. rate 20, height  5\' 5"  (1.651 m), weight 177 lb 8 oz (80.513 kg).  LABORATORY DATA: Lab Results  Component Value Date   WBC 5.9 09/24/2012   HGB 10.9* 09/24/2012   HCT 34.5* 09/24/2012   MCV 98.3 09/24/2012   PLT 271 09/24/2012      Chemistry      Component Value Date/Time   NA 140 09/24/2012 1207   NA 137 09/17/2012 0400   NA 145 09/06/2011 0902   K 3.6 09/24/2012 1207   K 3.8 09/17/2012 0400   K 3.9 09/06/2011 0902   CL 111* 09/24/2012 1207   CL 105 09/17/2012 0400   CL 100 09/06/2011 0902   CO2 23 09/24/2012 1207   CO2 22 09/17/2012 0400   CO2 26 09/06/2011 0902   BUN 13.0 09/24/2012 1207   BUN 13 09/17/2012 0400   BUN 12 09/06/2011 0902   CREATININE 0.8 09/24/2012 1207   CREATININE 0.85 09/17/2012 0400   CREATININE 0.5* 09/06/2011 0902      Component Value Date/Time   CALCIUM 9.7 09/24/2012 1207   CALCIUM 9.1 09/17/2012 0400   CALCIUM 9.5 09/06/2011 0902   ALKPHOS 56 09/24/2012 1207   ALKPHOS 51 09/08/2012 1645   ALKPHOS 55 09/06/2011 0902   AST 23 09/24/2012 1207   AST 22 09/08/2012 1645   AST 24 09/06/2011 0902   ALT 24 09/24/2012 1207   ALT 13 09/08/2012 1645   BILITOT 0.44 09/24/2012 1207   BILITOT 0.6 09/08/2012 1645   BILITOT 0.70 09/06/2011 0902       RADIOGRAPHIC STUDIES: Dg Chest 2 View  09/01/2012  *RADIOLOGY REPORT*  Clinical Data: Cough, pneumonia, lung cancer  CHEST - 2 VIEW  Comparison: 08/10/2012 and 04/17/2012  Findings: Cardiomediastinal silhouette is stable.  Left subclavian Port-A-Cath with tip in SVC.  No acute infiltrate or pulmonary edema.  A nodule in the right upper lobe measures 12.2 mm stable from 08/10/2012.  Postsurgical changes are noted in the right upper lobe.  Stable degenerative changes thoracic spine.  IMPRESSION: No active disease.  Stable nodule in the right upper lobe. Postsurgical changes in the right upper lobe.   Original Report Authenticated By: Natasha Mead, M.D.    Ct Chest W Contrast  08/10/2012  *RADIOLOGY REPORT*  Clinical Data:  Follow-up  stage IV non-Hodgkins lymphoma. Chemotherapy in progress.  Weakness and chills.  Short of breath.  CT CHEST, ABDOMEN AND PELVIS WITH CONTRAST  Technique:  Multidetector CT imaging of the chest, abdomen and pelvis was performed following the standard protocol during bolus administration of intravenous contrast.  Contrast: OMNIPAQUE IOHEXOL 300 MG/ML  SOLN  Comparison:  06/03/2012  CT CHEST  Findings:  Previously demonstrated bilateral pulmonary emboli are no longer visualized.  Mild right-sided hilar and mediastinal lymphadenopathy remains stable compared to previous study.  No new or increased areas of lymphadenopathy are seen within the thorax.  Moderate pulmonary emphysema again demonstrated.  Postsurgical scarring again seen in the posterior right upper lobe.  Posterior right upper lobe pulmonary nodule measuring 9 x 12 mm show no significant change in size.  Other small less than 1 cm nodular densities in the right middle and lower lobes are also stable.  No new or enlarging pulmonary nodules masses are identified.  There is mild infiltrate in the posterior right lower lobe with fluid or secretions filling the peripheral right lower lobe bronchi, consistent with bronchopneumonia.  No evidence of pleural or pericardial effusion.  IMPRESSION:  1.  Stable right lung nodules, largest measuring 9 x 12 mm in the posterior right upper lobe. 2.  Stable mild right-sided hilar and mediastinal lymphadenopathy. 3.  New mild infiltrate in posterior right lower lobe, consistent with pneumonia. 4.  Moderate pulmonary emphysema.  CT ABDOMEN AND PELVIS  Findings:  A small ill-defined low attenuation lesion in the posterior right hepatic lobe measuring approximately 1.4 cm remains stable.  Metastatic lesion cannot be excluded.  A small benign cyst dome the right hepatic lobe is stable.  No other liver masses are identified.  Cholelithiasis is again demonstrated, without findings of acute cholecystitis.  Benign left renal  cyst is stable.  Several calculi again seen within the right intrarenal collecting system without evidence of hydronephrosis.  No evidence of renal masses.  The adrenal glands, pancreas, and spleen are normal in appearance.  No lymphadenopathy identified within the abdomen or pelvis.  Prior hysterectomy noted.  No evidence of inflammatory process or abnormal fluid collections.  No evidence of bowel wall thickening or dilatation.  No suspicious bone lesions are identified. Multiple injection granulomas noted in the subcutaneous tissues of the anterior abdominal wall.  IMPRESSION:  1.  Stable 1.4 cm hypovascular lesion in the posterior segment of the right hepatic lobe.  Liver metastasis cannot be excluded. 2.  No new or progressive metastatic disease within the abdomen or pelvis. 3.  Stable cholelithiasis and right nephrolithiasis.   Original Report Authenticated By: Danae Orleans, M.D.    Ct Abdomen Pelvis W Contrast  08/10/2012  *RADIOLOGY REPORT*  Clinical Data:  Follow-up stage IV non-Hodgkins lymphoma. Chemotherapy in progress.  Weakness and chills.  Short of breath.  CT CHEST, ABDOMEN AND PELVIS WITH CONTRAST  Technique:  Multidetector CT imaging of the chest, abdomen and pelvis was performed following the standard protocol during bolus administration of intravenous contrast.  Contrast: OMNIPAQUE IOHEXOL 300 MG/ML  SOLN  Comparison:  06/03/2012  CT CHEST  Findings:  Previously demonstrated bilateral pulmonary emboli are no longer visualized.  Mild right-sided hilar and mediastinal lymphadenopathy remains stable compared to previous study.  No new or increased areas of lymphadenopathy are seen within the thorax.  Moderate pulmonary emphysema again demonstrated.  Postsurgical scarring again seen in the posterior right upper lobe.  Posterior right upper lobe pulmonary nodule measuring 9 x 12 mm show no significant change in size.  Other small less than 1 cm nodular densities in the right middle and lower  lobes are also stable.  No new or enlarging pulmonary nodules masses are identified.  There is mild infiltrate in the posterior right lower lobe with fluid or secretions filling the peripheral right lower lobe bronchi, consistent with bronchopneumonia.  No evidence of pleural or pericardial effusion.  IMPRESSION:  1.  Stable right lung nodules, largest measuring 9 x 12 mm in the posterior right upper lobe. 2.  Stable mild right-sided hilar and mediastinal lymphadenopathy. 3.  New mild infiltrate in posterior right lower lobe, consistent with pneumonia. 4.  Moderate pulmonary emphysema.  CT ABDOMEN AND PELVIS  Findings:  A small ill-defined low attenuation lesion in the posterior right hepatic lobe measuring approximately 1.4 cm remains stable.  Metastatic lesion cannot be excluded.  A small benign cyst dome the right hepatic lobe is stable.  No other liver masses are identified.  Cholelithiasis is again demonstrated, without findings of acute cholecystitis.  Benign left renal cyst is stable.  Several calculi again seen within the right intrarenal collecting system without evidence of hydronephrosis.  No evidence of renal masses.  The adrenal glands, pancreas, and spleen are normal in appearance.  No lymphadenopathy identified within the abdomen or pelvis.  Prior hysterectomy noted.  No evidence of inflammatory process or abnormal fluid collections.  No evidence of bowel wall thickening or dilatation.  No suspicious bone lesions are identified. Multiple injection granulomas noted in the subcutaneous tissues of the anterior abdominal wall.  IMPRESSION:  1.  Stable 1.4 cm hypovascular lesion in the posterior segment of the right hepatic lobe.  Liver metastasis cannot be excluded. 2.  No new or progressive metastatic disease within the abdomen or pelvis. 3.  Stable cholelithiasis and right nephrolithiasis.   Original Report Authenticated By: Danae Orleans, M.D.     ASSESSMENT/PLAN: This is a very pleasant 66 years  old white female with metastatic non-small cell lung cancer, adenocarcinoma status post 6 cycles of systemic chemotherapy with carboplatin and Alimta and currently undergoing maintenance chemotherapy with single agent Alimta status post 2 cycle. She has had a rough time with his first 2 cycles of maintenance chemotherapy with significant fatigue and weakness. She is currently in a skilled nursing facility receiving rehabilitation therapy. Her daughter is concerned that the maintenance chemotherapy may be too much for her at the current dose. Patient was discussed with Dr. Welton Flakes in  Dr. Asa Lente absence. We will postpone chemotherapy for 2 weeks, allowing the patient to get stronger. She will see Dr. Arbutus Ped at that visit a repeat CBC differential C. met magnesium. He will discuss any dose changes to the chemotherapy at that time. The patient and her daughters were in agreement with the plan.   Laural Benes, Wanisha Shiroma E, PA-C   All questions were answered. The patient knows to call the clinic with any problems, questions or concerns. We can certainly see the patient much sooner if necessary.  I spent 20 minutes counseling the patient face to face. The total time spent in the appointment was 30 minutes.

## 2012-10-05 ENCOUNTER — Telehealth: Payer: Self-pay | Admitting: Medical Oncology

## 2012-10-05 NOTE — Telephone Encounter (Signed)
Called to give appts to [facility]

## 2012-10-05 NOTE — Telephone Encounter (Signed)
Left appt information with Veronica Duarte at [facility]

## 2012-10-07 ENCOUNTER — Encounter: Payer: Self-pay | Admitting: Pharmacist

## 2012-10-08 ENCOUNTER — Other Ambulatory Visit (HOSPITAL_BASED_OUTPATIENT_CLINIC_OR_DEPARTMENT_OTHER): Payer: Medicare Other | Admitting: Lab

## 2012-10-08 ENCOUNTER — Ambulatory Visit: Payer: Medicare Other

## 2012-10-08 ENCOUNTER — Telehealth: Payer: Self-pay | Admitting: Internal Medicine

## 2012-10-08 ENCOUNTER — Ambulatory Visit (HOSPITAL_BASED_OUTPATIENT_CLINIC_OR_DEPARTMENT_OTHER): Payer: Medicare Other | Admitting: Internal Medicine

## 2012-10-08 VITALS — BP 130/73 | HR 89 | Temp 98.2°F | Resp 22 | Ht 65.0 in | Wt 177.8 lb

## 2012-10-08 DIAGNOSIS — C771 Secondary and unspecified malignant neoplasm of intrathoracic lymph nodes: Secondary | ICD-10-CM

## 2012-10-08 DIAGNOSIS — C3410 Malignant neoplasm of upper lobe, unspecified bronchus or lung: Secondary | ICD-10-CM

## 2012-10-08 DIAGNOSIS — C3490 Malignant neoplasm of unspecified part of unspecified bronchus or lung: Secondary | ICD-10-CM

## 2012-10-08 LAB — CBC WITH DIFFERENTIAL/PLATELET
BASO%: 0.1 % (ref 0.0–2.0)
Basophils Absolute: 0 10*3/uL (ref 0.0–0.1)
EOS%: 0.1 % (ref 0.0–7.0)
MCH: 31.5 pg (ref 25.1–34.0)
MCHC: 32.9 g/dL (ref 31.5–36.0)
MCV: 95.8 fL (ref 79.5–101.0)
MONO%: 8.4 % (ref 0.0–14.0)
RBC: 3.78 10*6/uL (ref 3.70–5.45)
RDW: 15.1 % — ABNORMAL HIGH (ref 11.2–14.5)
lymph#: 1.1 10*3/uL (ref 0.9–3.3)
nRBC: 0 % (ref 0–0)

## 2012-10-08 NOTE — Telephone Encounter (Signed)
appts made and printed for pt  Pt aware that she will get a call/mail with scan appt,contrast given

## 2012-10-09 NOTE — Patient Instructions (Signed)
Your too weak to proceed with systemic chemotherapy at this point. Followup visit in 2 months with repeat CT scan of the chest, abdomen and pelvis.

## 2012-10-09 NOTE — Progress Notes (Signed)
[facility] [phone]) [phone]   [phone]  OFFICE PROGRESS NOTE  [name], MD [address] [facility] [address] [place], [address] [place] [address]  PRINCIPAL DIAGNOSIS:  1) Metastatic non-small cell lung cancer, adenocarcinoma initially diagnosed as stage IB (T2a, N0, N0) in March 2012.  2) bilateral pulmonary emboli and deep venous thrombosis of the left lower extremity diagnosed on 06/03/2012   PRIOR THERAPY:  1. Status post wedge resection of the right upper lobe under the care of Dr. Laneta Simmers on February 14, 2011.  2. Lovenox 80 mg subcutaneous twice a day. First dose today. 3. Systemic chemotherapy with carboplatin for AUC of 5 and Alimta 500 mg/M2 every 3 weeks, the patient is status post 6 cycles. Current dose of Alimta is 375 mg per meter squared   CURRENT THERAPY:  1) Maintenance Alimta 500 mg/M2 every 3 weeks, status post 2 cycles, last dose was given 09/03/2012.  2) Arixtra 7.5 mg subcutaneously daily.   INTERVAL HISTORY: Veronica Duarte 66 y.o. female returns to the clinic today for followup visit accompanied by her daughter and sister. The patient continues to complain of increasing fatigue and weakness as well as confusion that time. She is currently a resident of a skilled nursing facility for rehabilitation. The expected to discharge her home in the next 1-2 weeks. Her daughter has some concern about taking her home early from the skilled nursing facility. She will arrange for an appointment with him for more detailed discussion of this option before discharging her home. The patient has been off chemotherapy since 09/03/2012. She is here today for evaluation before resuming her systemic therapy. She continues to have shortness breath increased with exertion but no significant cough, chest pain or hemoptysis.  MEDICAL HISTORY: Past Medical History  Diagnosis Date  . Diabetes mellitus     type 2- metformin  . Depression   . RAS (renal  artery stenosis)   . AAA (abdominal aortic aneurysm)   . Osteoarthritis   . COPD (chronic obstructive pulmonary disease)   . Hx of colonic polyps   . CAD (coronary artery disease)   . OSA (obstructive sleep apnea)     Uses CPAP pressure is 6 study was done > 5 years  . Hypertension     Sees Dr. Tresa Endo with [facility]  . ADENOCARCINOMA, RIGHT LUNG, UPPER LOBE 02/04/2011    ALLERGIES:   has no known allergies.  MEDICATIONS:  Current Outpatient Prescriptions  Medication Sig Dispense Refill  . acetaminophen (TYLENOL) 325 MG tablet Take 2 tablets (650 mg total) by mouth every 6 (six) hours as needed (or Fever >/= 101).      . Aclidinium Bromide (TUDORZA PRESSAIR) 400 MCG/ACT AEPB Inhale 1 puff into the lungs 2 (two) times daily.  1 each  6  . albuterol (PROVENTIL HFA;VENTOLIN HFA) 108 (90 BASE) MCG/ACT inhaler Inhale 2 puffs into the lungs every 6 (six) hours as needed. For shortness of breath      . albuterol (PROVENTIL) (5 MG/ML) 0.5% nebulizer solution Take 0.5 mLs (2.5 mg total) by nebulization every 4 (four) hours as needed for wheezing or shortness of breath.  20 mL    . buPROPion (WELLBUTRIN XL) 150 MG 24 hr tablet Take 1 tablet (150 mg total) by mouth daily.  90 tablet  3  . cetirizine (ZYRTEC) 10 MG tablet Take 1 tablet (10 mg total) by mouth daily as needed. For allergies  90 tablet  3  .  dexamethasone (DECADRON) 4 MG tablet Take 4 mg by mouth See admin instructions. 1 tab BID day before, during, and after chemo      . diltiazem (CARDIZEM CD) 120 MG 24 hr capsule Take 1 capsule (120 mg total) by mouth daily.  90 capsule  3  . DULoxetine (CYMBALTA) 60 MG capsule Take 1 capsule (60 mg total) by mouth daily.  90 capsule  3  . ezetimibe (ZETIA) 10 MG tablet Take 1 tablet (10 mg total) by mouth daily.  90 tablet  3  . feeding supplement (ENSURE COMPLETE) LIQD Take 237 mLs by mouth 2 (two) times daily between meals.      . ferrous sulfate 325 (65 FE) MG tablet Take 1 tablet (325 mg total) by  mouth daily with breakfast.  90 tablet  3  . Fluticasone-Salmeterol (ADVAIR) 250-50 MCG/DOSE AEPB Inhale 1 puff into the lungs every 12 (twelve) hours.      . folic acid (FOLVITE) 1 MG tablet Take 1 tablet (1 mg total) by mouth daily.  90 tablet  3  . fondaparinux (ARIXTRA) 7.5 MG/0.6ML SOLN Inject 0.6 mLs (7.5 mg total) into the skin daily.  90 Syringe  0  . guaiFENesin (MUCINEX) 600 MG 12 hr tablet Take 600 mg by mouth 2 (two) times daily. For congestion      . ipratropium (ATROVENT) 0.02 % nebulizer solution Take 2.5 mLs (0.5 mg total) by nebulization every 4 (four) hours as needed.  75 mL    . lidocaine-prilocaine (EMLA) cream Apply 1 application topically as needed. Apply to port 1 hour before chemo appointment      . magnesium oxide (MAG-OX) 400 (241.3 MG) MG tablet Take 400-800 mg by mouth 2 (two) times daily. Takes 2 tablets in the morning and 1 tablet at night      . megestrol (MEGACE) 400 MG/10ML suspension Take 10 mLs (400 mg total) by mouth daily.  240 mL    . Memantine HCl ER (NAMENDA XR) 14 MG CP24 Take 1 tablet by mouth daily.      . Multiple Vitamin (MULITIVITAMIN WITH MINERALS) TABS Take 1 tablet by mouth daily.      . Nebivolol HCl (BYSTOLIC) 20 MG TABS Take 1 tablet (20 mg total) by mouth daily.  30 tablet    . niacin (NIASPAN) 1000 MG CR tablet Take 1,000 mg by mouth at bedtime.       . potassium chloride SA (K-DUR,KLOR-CON) 20 MEQ tablet Take 1 tablet (20 mEq total) by mouth daily.      Marland Kitchen PRESCRIPTION MEDICATION Inject into the vein every 21 ( twenty-one) days. Alimta and Carboplatin every 3 weeks.      . prochlorperazine (COMPAZINE) 10 MG tablet Take 1 tablet (10 mg total) by mouth every 6 (six) hours as needed. For nausea  30 tablet  11  . rosuvastatin (CRESTOR) 20 MG tablet Take 20 mg by mouth daily.      . SYMBICORT 160-4.5 MCG/ACT inhaler INHALE 2 PUFFS INTO THE LUNGS 2 TIMES   DAILY  10.2 g  5  . tiotropium (SPIRIVA) 18 MCG inhalation capsule Place 1 capsule (18 mcg  total) into inhaler and inhale daily.  30 capsule    . vitamin C (ASCORBIC ACID) 500 MG tablet Take 500 mg by mouth 2 (two) times daily.       . metFORMIN (GLUCOPHAGE) 500 MG tablet         SURGICAL HISTORY:  Past Surgical History  Procedure Date  .  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 Vesicovaginal fistula closure w/ tah   . Abdominal hysterectomy   . Right video-assisted thoracoscopy with wedge resection of 02/14/11    Dr Evelene Croon  . Cardiac catheterization 08/30/2009    Dr Aleen Campi  . Intraoperative transesophageal echocardiography. 08/30/2009    Dr Kipp Brood  . Mediastinoscopy 04/03/2012    Procedure: MEDIASTINOSCOPY;  Surgeon: Alleen Borne, MD;  Location: [facility] OR;  Service: Thoracic;  Laterality: N/A;  . Portacath placement 04/03/2012    Procedure: INSERTION PORT-A-CATH;  Surgeon: Alleen Borne, MD;  Location: [facility] OR;  Service: Thoracic;  Laterality: Left;  Insertion of 9.6Fr. Pre-attached power port in Left Subclavian    REVIEW OF SYSTEMS:  A comprehensive review of systems was negative except for: Constitutional: positive for anorexia and fatigue Respiratory: positive for dyspnea on exertion Musculoskeletal: positive for muscle weakness   PHYSICAL EXAMINATION: General appearance: alert, cooperative, fatigued and no distress Head: Normocephalic, without obvious abnormality, atraumatic Neck: no adenopathy Resp: clear to auscultation bilaterally Cardio: regular rate and rhythm, S1, S2 normal, no murmur, click, rub or gallop GI: soft, non-tender; bowel sounds normal; no masses,  no organomegaly Extremities: extremities normal, atraumatic, no cyanosis or edema  ECOG PERFORMANCE STATUS: 2 - Symptomatic, <50% confined to bed  Blood pressure 130/73, pulse 89, temperature 98.2 F (36.8 C), temperature source Oral, resp. rate 22, height 5\' 5"  (1.651 m), weight 177 lb 12.8 oz (80.65 kg).  LABORATORY DATA: Lab  Results  Component Value Date   WBC 9.0 10/08/2012   HGB 11.9 10/08/2012   HCT 36.2 10/08/2012   MCV 95.8 10/08/2012   PLT 184 10/08/2012      Chemistry      Component Value Date/Time   NA 140 09/24/2012 1207   NA 137 09/17/2012 0400   NA 145 09/06/2011 0902   K 3.6 09/24/2012 1207   K 3.8 09/17/2012 0400   K 3.9 09/06/2011 0902   CL 111* 09/24/2012 1207   CL 105 09/17/2012 0400   CL 100 09/06/2011 0902   CO2 23 09/24/2012 1207   CO2 22 09/17/2012 0400   CO2 26 09/06/2011 0902   BUN 13.0 09/24/2012 1207   BUN 13 09/17/2012 0400   BUN 12 09/06/2011 0902   CREATININE 0.8 09/24/2012 1207   CREATININE 0.85 09/17/2012 0400   CREATININE 0.5* 09/06/2011 0902      Component Value Date/Time   CALCIUM 9.7 09/24/2012 1207   CALCIUM 9.1 09/17/2012 0400   CALCIUM 9.5 09/06/2011 0902   ALKPHOS 56 09/24/2012 1207   ALKPHOS 51 09/08/2012 1645   ALKPHOS 55 09/06/2011 0902   AST 23 09/24/2012 1207   AST 22 09/08/2012 1645   AST 24 09/06/2011 0902   ALT 24 09/24/2012 1207   ALT 13 09/08/2012 1645   BILITOT 0.44 09/24/2012 1207   BILITOT 0.6 09/08/2012 1645   BILITOT 0.70 09/06/2011 0902       RADIOGRAPHIC STUDIES: No results found.  ASSESSMENT: This is a very pleasant 66 years old white female with metastatic non-small cell lung cancer status post 6 cycles of systemic chemotherapy was carboplatin and Alimta followed by 2 cycles of maintenance Alimta. The patient has significant fatigue and weakness in addition to confusion and memory issues at times.   PLAN: I have a lengthy discussion with the patient and her family today about her current condition. I went the patient is too weak to proceed  with any systemic chemotherapy at this point. I recommended for her to continue on observation for now with repeat CT scan of the chest, abdomen and pelvis in 2 months for restaging of her disease. My recommendation is for this patient to continue at the skilled nursing facility for further  rehabilitation at this point unless her daughter will be able to arrange for someone to monitor her closely at home as the patient cannot stay home by herself. She was advised to call me immediately if she has any concerning symptoms in the interval. The patient will continue on the treatment with Arixtra for the history of bilateral pulmonary emboli. All questions were answered. The patient knows to call the clinic with any problems, questions or concerns. We can certainly see the patient much sooner if necessary.  I spent 15 minutes counseling the patient face to face. The total time spent in the appointment was 25 minutes.

## 2012-10-14 ENCOUNTER — Ambulatory Visit: Payer: Medicare Other

## 2012-10-14 ENCOUNTER — Other Ambulatory Visit: Payer: Medicare Other | Admitting: Lab

## 2012-10-19 ENCOUNTER — Encounter: Payer: Self-pay | Admitting: Internal Medicine

## 2012-10-19 NOTE — Progress Notes (Signed)
PER LAST OFFICE NOTE CHEMO IS ON HOLD FOR NOW.  WILL RE-EVALUATE IN 2 MONTHS. LAST ALIMTA WAS 09/03/12.

## 2012-10-28 ENCOUNTER — Telehealth: Payer: Self-pay | Admitting: Medical Oncology

## 2012-10-28 ENCOUNTER — Telehealth: Payer: Self-pay | Admitting: *Deleted

## 2012-10-28 NOTE — Telephone Encounter (Signed)
Daughter is trying to get Veronica Duarte into an assisted living and needs cxr showing no active disease ( TB)-xr faxed to [employer]

## 2012-10-28 NOTE — Telephone Encounter (Signed)
Pt's daughter called requesting copy of last Flu vaccine and TB test-pt will be admitted to assisted living facility today. (Fax to [phone]) Copy of immunization record faxed to number provided by daughter-informed her via VM copy faxed of immunization record.

## 2012-10-29 NOTE — Telephone Encounter (Signed)
Veronica Duarte is now a pt at  [facility] assisted living and they do not give injections ( pt on Arixtra). Can  Veronica Duarte be switched to coumadin ? If so please fax an order to [facility] at [phone].Per Dr Arbutus Ped, okay for coumadin 5 mg po daily . Continue arixtra until INR theraputic. Facility MD will monitor and regulate dose. Rx given to Dr Arbutus Ped for signature. I will have desk nurse tomorrow fax order

## 2012-11-05 ENCOUNTER — Ambulatory Visit: Payer: Medicare Other

## 2012-11-05 ENCOUNTER — Other Ambulatory Visit: Payer: Medicare Other | Admitting: Lab

## 2012-11-06 ENCOUNTER — Ambulatory Visit: Payer: Medicare Other | Admitting: Internal Medicine

## 2012-11-11 ENCOUNTER — Emergency Department: Payer: Self-pay | Admitting: Emergency Medicine

## 2012-11-11 LAB — PROTIME-INR
INR: 1.2
Prothrombin Time: 15.1 secs — ABNORMAL HIGH (ref 11.5–14.7)

## 2012-11-25 ENCOUNTER — Telehealth: Payer: Self-pay | Admitting: Internal Medicine

## 2012-11-25 NOTE — Telephone Encounter (Signed)
daughter had called to ck on lab appt for 1/9 which looks like it should have been cx.    lm on her vm with this info     anne

## 2012-11-26 ENCOUNTER — Other Ambulatory Visit: Payer: Medicare Other | Admitting: Lab

## 2012-12-03 ENCOUNTER — Other Ambulatory Visit: Payer: Medicare Other | Admitting: Lab

## 2012-12-03 ENCOUNTER — Telehealth: Payer: Self-pay | Admitting: Internal Medicine

## 2012-12-03 NOTE — Telephone Encounter (Signed)
Veronica Duarte had called and lm to cx her labs for today as she had labs drawn at the facilty,lm for her today that labs would be cx,but to make sure that a bun/cr was drawn   [name]

## 2012-12-07 ENCOUNTER — Ambulatory Visit (HOSPITAL_COMMUNITY)
Admission: RE | Admit: 2012-12-07 | Discharge: 2012-12-07 | Disposition: A | Discharge status: Home / Self Care | Payer: Medicare Other | Source: Ambulatory Visit | Attending: Internal Medicine | Admitting: Internal Medicine

## 2012-12-07 DIAGNOSIS — I700 Atherosclerosis of aorta: Secondary | ICD-10-CM | POA: Insufficient documentation

## 2012-12-07 DIAGNOSIS — C3490 Malignant neoplasm of unspecified part of unspecified bronchus or lung: Secondary | ICD-10-CM | POA: Insufficient documentation

## 2012-12-07 DIAGNOSIS — M5137 Other intervertebral disc degeneration, lumbosacral region: Secondary | ICD-10-CM | POA: Insufficient documentation

## 2012-12-07 DIAGNOSIS — K8020 Calculus of gallbladder without cholecystitis without obstruction: Secondary | ICD-10-CM | POA: Insufficient documentation

## 2012-12-07 DIAGNOSIS — K7689 Other specified diseases of liver: Secondary | ICD-10-CM | POA: Insufficient documentation

## 2012-12-07 DIAGNOSIS — M51379 Other intervertebral disc degeneration, lumbosacral region without mention of lumbar back pain or lower extremity pain: Secondary | ICD-10-CM | POA: Insufficient documentation

## 2012-12-07 DIAGNOSIS — J984 Other disorders of lung: Secondary | ICD-10-CM | POA: Insufficient documentation

## 2012-12-07 DIAGNOSIS — R918 Other nonspecific abnormal finding of lung field: Secondary | ICD-10-CM | POA: Insufficient documentation

## 2012-12-07 DIAGNOSIS — N200 Calculus of kidney: Secondary | ICD-10-CM | POA: Insufficient documentation

## 2012-12-07 DIAGNOSIS — Q762 Congenital spondylolisthesis: Secondary | ICD-10-CM | POA: Insufficient documentation

## 2012-12-07 DIAGNOSIS — J438 Other emphysema: Secondary | ICD-10-CM | POA: Insufficient documentation

## 2012-12-07 DIAGNOSIS — C3410 Malignant neoplasm of upper lobe, unspecified bronchus or lung: Secondary | ICD-10-CM

## 2012-12-08 ENCOUNTER — Telehealth: Payer: Self-pay | Admitting: Internal Medicine

## 2012-12-08 ENCOUNTER — Ambulatory Visit (HOSPITAL_COMMUNITY): Payer: Medicare Other

## 2012-12-08 NOTE — Telephone Encounter (Signed)
called and lm that dr mkm is not here 1/23 and to call 1/22 in the am if she cannot get her here for 1/22 12;15   anne

## 2012-12-09 ENCOUNTER — Ambulatory Visit: Payer: Medicare Other | Admitting: Internal Medicine

## 2012-12-09 ENCOUNTER — Other Ambulatory Visit: Payer: Self-pay | Admitting: Medical Oncology

## 2012-12-09 ENCOUNTER — Other Ambulatory Visit: Payer: Self-pay | Admitting: *Deleted

## 2012-12-09 NOTE — Telephone Encounter (Signed)
s/w  daughter and she is aware of her mother new appt on 2/6-per her req     anne

## 2012-12-10 ENCOUNTER — Ambulatory Visit: Payer: Medicare Other | Admitting: Internal Medicine

## 2012-12-23 ENCOUNTER — Other Ambulatory Visit: Payer: Self-pay | Admitting: *Deleted

## 2012-12-24 ENCOUNTER — Ambulatory Visit: Payer: Medicare Other

## 2012-12-24 ENCOUNTER — Ambulatory Visit: Payer: Medicare Other | Admitting: Lab

## 2012-12-24 ENCOUNTER — Ambulatory Visit (HOSPITAL_BASED_OUTPATIENT_CLINIC_OR_DEPARTMENT_OTHER): Payer: Medicare Other | Admitting: Internal Medicine

## 2012-12-24 ENCOUNTER — Encounter: Payer: Self-pay | Admitting: Internal Medicine

## 2012-12-24 VITALS — BP 124/81 | HR 76 | Temp 97.1°F | Resp 20 | Ht 65.0 in | Wt 179.9 lb

## 2012-12-24 DIAGNOSIS — Z86718 Personal history of other venous thrombosis and embolism: Secondary | ICD-10-CM

## 2012-12-24 DIAGNOSIS — Z86711 Personal history of pulmonary embolism: Secondary | ICD-10-CM

## 2012-12-24 DIAGNOSIS — C3410 Malignant neoplasm of upper lobe, unspecified bronchus or lung: Secondary | ICD-10-CM

## 2012-12-24 DIAGNOSIS — C3490 Malignant neoplasm of unspecified part of unspecified bronchus or lung: Secondary | ICD-10-CM

## 2012-12-24 MED ORDER — SODIUM CHLORIDE 0.9 % IJ SOLN
10.0000 mL | INTRAMUSCULAR | Status: DC | PRN
  Administered 2012-12-24: 10 mL via INTRAVENOUS
  Filled 2012-12-24: qty 10

## 2012-12-24 MED ORDER — HEPARIN SOD (PORK) LOCK FLUSH 100 UNIT/ML IV SOLN
500.0000 [IU] | Freq: Once | INTRAVENOUS | Status: AC
  Administered 2012-12-24: 500 [IU] via INTRAVENOUS
  Filled 2012-12-24: qty 5

## 2012-12-24 NOTE — Progress Notes (Signed)
[facility] Telephone:([phone]) [phone]   Fax:([phone]) [phone]  OFFICE PROGRESS NOTE  [name], MD [address] [facility] [address] [place], [address]st [address] [place] [address]  PRINCIPAL DIAGNOSIS:  1) Metastatic non-small cell lung cancer, adenocarcinoma initially diagnosed as stage IB (T2a, N0, N0) in March 2012.  2) bilateral pulmonary emboli and deep venous thrombosis of the left lower extremity diagnosed on 06/03/2012   PRIOR THERAPY:  1. Status post wedge resection of the right upper lobe under the care of Dr. Laneta Simmers on February 14, 2011.  2. Lovenox 80 mg subcutaneous twice a day. First dose today. 3. Systemic chemotherapy with carboplatin for AUC of 5 and Alimta 500 mg/M2 every 3 weeks, the patient is status post 6 cycles. Current dose of Alimta is 375 mg per meter squared. 4. Arixtra 7.5 mg subcutaneously daily.  CURRENT THERAPY:  1) Maintenance Alimta 500 mg/M2 every 3 weeks, status post 2 cycles, last dose was given 09/03/2012.  2) Coumadin 5 mg by mouth daily except Monday 2.5 mg.  INTERVAL HISTORY: Veronica Duarte 67 y.o. female returns to the clinic today for followup visit accompanied her sister and daughter. The patient is feeling fine today with no specific complaints she is currently a resident of an assisted living facility in [facility]. She is feeling much better today with no specific complaints except for mild shortness of breath with exertion. She denied having any significant chest pain, cough or hemoptysis. She had mild pain in the right hip area but this has been going on for a while and not getting worse. She attributed it to previous arthritis. The patient was last seen in the clinic on 10/08/2012 and has been off her treatment since that time. She had repeat CT scan of the chest, abdomen and pelvis performed recently and she is here for evaluation and discussion of her scan results.   MEDICAL HISTORY: Past Medical History  Diagnosis Date    . Diabetes mellitus     type 2- metformin  . Depression   . RAS (renal artery stenosis)   . AAA (abdominal aortic aneurysm)   . Osteoarthritis   . COPD (chronic obstructive pulmonary disease)   . Hx of colonic polyps   . CAD (coronary artery disease)   . OSA (obstructive sleep apnea)     Uses CPAP pressure is 6 study was done > 5 years  . Hypertension     Sees Dr. Tresa Endo with [facility]  . ADENOCARCINOMA, RIGHT LUNG, UPPER LOBE 02/04/2011    ALLERGIES:   has no known allergies.  MEDICATIONS:  Current Outpatient Prescriptions  Medication Sig Dispense Refill  . albuterol (PROVENTIL HFA;VENTOLIN HFA) 108 (90 BASE) MCG/ACT inhaler Inhale 2 puffs into the lungs every 6 (six) hours as needed. For shortness of breath      . albuterol (PROVENTIL) (5 MG/ML) 0.5% nebulizer solution Take 0.5 mLs (2.5 mg total) by nebulization every 4 (four) hours as needed for wheezing or shortness of breath.  20 mL    . buPROPion (WELLBUTRIN XL) 150 MG 24 hr tablet Take 1 tablet (150 mg total) by mouth daily.  90 tablet  3  . cetirizine (ZYRTEC) 10 MG tablet Take 1 tablet (10 mg total) by mouth daily as needed. For allergies  90 tablet  3  . diltiazem (CARDIZEM CD) 120 MG 24 hr capsule Take 1 capsule (120 mg total) by mouth daily.  90 capsule  3  . DULoxetine (CYMBALTA)  60 MG capsule Take 1 capsule (60 mg total) by mouth daily.  90 capsule  3  . ferrous sulfate 325 (65 FE) MG tablet Take 1 tablet (325 mg total) by mouth daily with breakfast.  90 tablet  3  . folic acid (FOLVITE) 1 MG tablet Take 1 tablet (1 mg total) by mouth daily.  90 tablet  3  . guaiFENesin (MUCINEX) 600 MG 12 hr tablet Take 600 mg by mouth 2 (two) times daily. For congestion      . Memantine HCl ER (NAMENDA XR) 14 MG CP24 Take 1 tablet by mouth daily.      . Multiple Vitamin (MULITIVITAMIN WITH MINERALS) TABS Take 1 tablet by mouth daily.      . Nebivolol HCl (BYSTOLIC) 20 MG TABS Take 1 tablet (20 mg total) by mouth daily.  30 tablet    .  niacin (NIASPAN) 1000 MG CR tablet Take 1,000 mg by mouth at bedtime.       . potassium chloride SA (K-DUR,KLOR-CON) 20 MEQ tablet Take 1 tablet (20 mEq total) by mouth daily.      . rosuvastatin (CRESTOR) 20 MG tablet Take 20 mg by mouth daily.      . SYMBICORT 160-4.5 MCG/ACT inhaler INHALE 2 PUFFS INTO THE LUNGS 2 TIMES   DAILY  10.2 g  5  . tiotropium (SPIRIVA) 18 MCG inhalation capsule Place 1 capsule (18 mcg total) into inhaler and inhale daily.  30 capsule    . vitamin C (ASCORBIC ACID) 500 MG tablet Take 500 mg by mouth 2 (two) times daily.       Marland Kitchen warfarin (COUMADIN) 5 MG tablet Take 5 mg by mouth daily. 5 mg daily except 2.5 mg on monday      . acetaminophen (TYLENOL) 325 MG tablet Take 2 tablets (650 mg total) by mouth every 6 (six) hours as needed (or Fever >/= 101).      Marland Kitchen dexamethasone (DECADRON) 4 MG tablet Take 4 mg by mouth See admin instructions. 1 tab BID day before, during, and after chemo      . ezetimibe (ZETIA) 10 MG tablet Take 1 tablet (10 mg total) by mouth daily.  90 tablet  3  . lidocaine-prilocaine (EMLA) cream Apply 1 application topically as needed. Apply to port 1 hour before chemo appointment      . prochlorperazine (COMPAZINE) 10 MG tablet Take 1 tablet (10 mg total) by mouth every 6 (six) hours as needed. For nausea  30 tablet  11    SURGICAL HISTORY:  Past Surgical History  Procedure Date  . 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 Vesicovaginal fistula closure w/ tah   . Abdominal hysterectomy   . Right video-assisted thoracoscopy with wedge resection of 02/14/11    Dr Evelene Croon  . Cardiac catheterization 08/30/2009    Dr Aleen Campi  . Intraoperative transesophageal echocardiography. 08/30/2009    Dr Kipp Brood  . Mediastinoscopy 04/03/2012    Procedure: MEDIASTINOSCOPY;  Surgeon: Alleen Borne, MD;  Location: [facility] OR;  Service: Thoracic;  Laterality: N/A;  . Portacath placement 04/03/2012     Procedure: INSERTION PORT-A-CATH;  Surgeon: Alleen Borne, MD;  Location: [facility] OR;  Service: Thoracic;  Laterality: Left;  Insertion of 9.6Fr. Pre-attached power port in Left Subclavian    REVIEW OF SYSTEMS:  A comprehensive review of systems was negative except for: Constitutional: positive for fatigue Respiratory: positive for dyspnea on exertion  Musculoskeletal: positive for bone pain   PHYSICAL EXAMINATION: General appearance: alert, cooperative and no distress Head: Normocephalic, without obvious abnormality, atraumatic Neck: no adenopathy Resp: clear to auscultation bilaterally Cardio: regular rate and rhythm, S1, S2 normal, no murmur, click, rub or gallop GI: soft, non-tender; bowel sounds normal; no masses,  no organomegaly Extremities: extremities normal, atraumatic, no cyanosis or edema  ECOG PERFORMANCE STATUS: 2 - Symptomatic, <50% confined to bed  Blood pressure 124/81, pulse 76, temperature 97.1 F (36.2 C), temperature source Oral, resp. rate 20, height 5\' 5"  (1.651 m), weight 179 lb 14.4 oz (81.602 kg).  LABORATORY DATA: Lab Results  Component Value Date   WBC 9.0 10/08/2012   HGB 11.9 10/08/2012   HCT 36.2 10/08/2012   MCV 95.8 10/08/2012   PLT 184 10/08/2012      Chemistry      Component Value Date/Time   NA 140 09/24/2012 1207   NA 137 09/17/2012 0400   NA 145 09/06/2011 0902   K 3.6 09/24/2012 1207   K 3.8 09/17/2012 0400   K 3.9 09/06/2011 0902   CL 111* 09/24/2012 1207   CL 105 09/17/2012 0400   CL 100 09/06/2011 0902   CO2 23 09/24/2012 1207   CO2 22 09/17/2012 0400   CO2 26 09/06/2011 0902   BUN 13.0 09/24/2012 1207   BUN 13 09/17/2012 0400   BUN 12 09/06/2011 0902   CREATININE 0.8 09/24/2012 1207   CREATININE 0.85 09/17/2012 0400   CREATININE 0.5* 09/06/2011 0902      Component Value Date/Time   CALCIUM 9.7 09/24/2012 1207   CALCIUM 9.1 09/17/2012 0400   CALCIUM 9.5 09/06/2011 0902   ALKPHOS 56 09/24/2012 1207   ALKPHOS 51 09/08/2012 1645    ALKPHOS 55 09/06/2011 0902   AST 23 09/24/2012 1207   AST 22 09/08/2012 1645   AST 24 09/06/2011 0902   ALT 24 09/24/2012 1207   ALT 13 09/08/2012 1645   BILITOT 0.44 09/24/2012 1207   BILITOT 0.6 09/08/2012 1645   BILITOT 0.70 09/06/2011 0902       RADIOGRAPHIC STUDIES: Ct Abdomen Pelvis Wo Contrast  12/08/2012  *RADIOLOGY REPORT*  Clinical Data:  Restaging lung cancer  CT CHEST, ABDOMEN AND PELVIS WITHOUT CONTRAST  Technique:  Multidetector CT imaging of the chest, abdomen and pelvis was performed following the standard protocol without IV contrast.  Comparison:  CT chest abdomen pelvis with contrast 08/10/2012 and PET CT 03/06/2012  CT CHEST  Findings:  Lack of intravenous contrast limits the sensitivity for evaluating the vascular structures and hila  Left chest wall Port-A-Cath terminates in the upper SVC.  Normal heart size.  Prior median sternotomy for CABG.  Normal thyroid gland.  Negative for pleural or pericardial effusion.  Suspect prominent right hilar nodal tissue, estimated to be 1.6 cm short axis. A right cardiophrenic angle lymph node is now markedly enlarged, measuring 17 x 21 mm (previously 13 x 10 mm).  Moderate centrilobular emphysema.  Surgical scarring is noted in the posterior right upper lobe.  In the inferior aspect of the scar is an enlarging irregular nodule that measures 18 x 9 mm on image #26 of the lung windows (previously measured 10 x 5 mm).  There are two new nodules along the right major fissure just inferior to the scar that measure 5 mm each.  Right upper lobe pulmonary nodule adjacent to the major fissure has increased in size.  It measures 16 x 12 mm (previously 12 x 9 mm).  Spiculated appearing right middle lobe nodule is 7.6 mm, stable.   Negative for nodules in the left lung.  Negative for airspace disease.  No acute or suspicious bony abnormalities.  IMPRESSION: Progression of disease compared to the CT of 08/10/2012.  Enlarging right upper lobe pulmonary nodules  and two new 5 mm nodules along the right neck major fissure.  Significant enlargement of a right cardiophrenic angle lymph node.  CT ABDOMEN AND PELVIS  Findings:  Ill-defined soft tissue nodule in the right hepatic lobe posteriorly is larger on today's examination, measuring 23 x 17 mm (previously 14 x 11 mm).  Stable circumscribed water density lesion in the right hepatic lobe near the dome, likely a cyst.  Cholelithiasis, stable.  2.6 cm cyst upper pole left kidney is stable.  Several stones within the right renal lower pole collecting system and proximal renal pelvis are stable. There is an extrarenal pelvis on the right, without definite hydronephrosis. Both ureters are normal in caliber.  Normal adrenal glands, spleen, and pancreas.  Oral contrast is seen within the stomach and majority of the small bowel loops at the time imaging.  The distal ileum and colon are not opacified.  Bowel loops are normal in caliber. Uncomplicated sigmoid colon diverticulosis.  Normal appendix.  Normal urinary bladder.  10 mm short axis portacaval 11 mm short axis portacaval lymph node (previously 7 mm).  No additional enlarging or suspicious lymph nodes are identified in the abdomen or pelvis.  Multiple nodular areas of stranding and subcutaneous fat of the lower anterior abdominal wall bilaterally most consistent with injection granulomas appear similar to prior exam.  There is extensive atherosclerotic calcification of the abdominal aorta, with focal ectasia in the proximal abdominal aorta measuring up to 2.6 cm AP diameter.  Grade 1 anterolisthesis of L4-L5 likely related facet joint degenerative change.  Degenerative disc disease at L5-S1.  No acute or suspicious osseous lesion.  IMPRESSION:  1.  Enlarging right hepatic lobe soft tissue nodule is consistent with hepatic metastasis.  Measures 23 x 17 mm on today's exam. 2. 11 mm portacaval lymph node is increased from 7 mm.  Metastatic involvement cannot be excluded. 3.  Cholelithiasis. 4. Right renal calculi   Original Report Authenticated By: Britta Mccreedy, M.D.    Ct Chest Wo Contrast  12/08/2012  *RADIOLOGY REPORT*  Clinical Data:  Restaging lung cancer  CT CHEST, ABDOMEN AND PELVIS WITHOUT CONTRAST  Technique:  Multidetector CT imaging of the chest, abdomen and pelvis was performed following the standard protocol without IV contrast.  Comparison:  CT chest abdomen pelvis with contrast 08/10/2012 and PET CT 03/06/2012  CT CHEST  Findings:  Lack of intravenous contrast limits the sensitivity for evaluating the vascular structures and hila  Left chest wall Port-A-Cath terminates in the upper SVC.  Normal heart size.  Prior median sternotomy for CABG.  Normal thyroid gland.  Negative for pleural or pericardial effusion.  Suspect prominent right hilar nodal tissue, estimated to be 1.6 cm short axis. A right cardiophrenic angle lymph node is now markedly enlarged, measuring 17 x 21 mm (previously 13 x 10 mm).  Moderate centrilobular emphysema.  Surgical scarring is noted in the posterior right upper lobe.  In the inferior aspect of the scar is an enlarging irregular nodule that measures 18 x 9 mm on image #26 of the lung windows (previously measured 10 x 5 mm).  There are two new nodules along the right major fissure just inferior to the scar that measure  5 mm each.  Right upper lobe pulmonary nodule adjacent to the major fissure has increased in size.  It measures 16 x 12 mm (previously 12 x 9 mm).  Spiculated appearing right middle lobe nodule is 7.6 mm, stable.   Negative for nodules in the left lung.  Negative for airspace disease.  No acute or suspicious bony abnormalities.  IMPRESSION: Progression of disease compared to the CT of 08/10/2012.  Enlarging right upper lobe pulmonary nodules and two new 5 mm nodules along the right neck major fissure.  Significant enlargement of a right cardiophrenic angle lymph node.  CT ABDOMEN AND PELVIS  Findings:  Ill-defined soft tissue  nodule in the right hepatic lobe posteriorly is larger on today's examination, measuring 23 x 17 mm (previously 14 x 11 mm).  Stable circumscribed water density lesion in the right hepatic lobe near the dome, likely a cyst.  Cholelithiasis, stable.  2.6 cm cyst upper pole left kidney is stable.  Several stones within the right renal lower pole collecting system and proximal renal pelvis are stable. There is an extrarenal pelvis on the right, without definite hydronephrosis. Both ureters are normal in caliber.  Normal adrenal glands, spleen, and pancreas.  Oral contrast is seen within the stomach and majority of the small bowel loops at the time imaging.  The distal ileum and colon are not opacified.  Bowel loops are normal in caliber. Uncomplicated sigmoid colon diverticulosis.  Normal appendix.  Normal urinary bladder.  10 mm short axis portacaval 11 mm short axis portacaval lymph node (previously 7 mm).  No additional enlarging or suspicious lymph nodes are identified in the abdomen or pelvis.  Multiple nodular areas of stranding and subcutaneous fat of the lower anterior abdominal wall bilaterally most consistent with injection granulomas appear similar to prior exam.  There is extensive atherosclerotic calcification of the abdominal aorta, with focal ectasia in the proximal abdominal aorta measuring up to 2.6 cm AP diameter.  Grade 1 anterolisthesis of L4-L5 likely related facet joint degenerative change.  Degenerative disc disease at L5-S1.  No acute or suspicious osseous lesion.  IMPRESSION:  1.  Enlarging right hepatic lobe soft tissue nodule is consistent with hepatic metastasis.  Measures 23 x 17 mm on today's exam. 2. 11 mm portacaval lymph node is increased from 7 mm.  Metastatic involvement cannot be excluded. 3. Cholelithiasis. 4. Right renal calculi   Original Report Authenticated By: Britta Mccreedy, M.D.     ASSESSMENT: This is a very pleasant 67 years old white female with metastatic non-small cell  lung cancer as well as history of bilateral pulmonary emboli and deep venous thrombosis. The patient is status post systemic chemotherapy with carboplatin and Alimta followed by 2 cycles of maintenance Alimta but she has evidence for disease progression on the recent scan.   PLAN: I discussed the scan results with the patient and her family. I discussed with the patient her family treatment options including continuous observation and palliative care versus consideration of systemic chemotherapy. The patient is feeling fine today and does not want to undergo any further chemotherapy at this point. She preferred to continue on with observation and palliative care at this point. Her family also agreed to this option. She would come back for followup visit on as-needed basis. She was advised to call me immediately if there is any significant change in her condition or if she wants to get the hospice service involved in her care. All questions were answered. The patient knows to  call the clinic with any problems, questions or concerns. We can certainly see the patient much sooner if necessary.

## 2012-12-24 NOTE — Patient Instructions (Signed)
Your recent scan showed evidence for disease progression. We discussed treatment options including systemic chemotherapy versus observation and palliative care.  You preferred to continue on observation for now. Followup on as-needed basis

## 2013-01-12 ENCOUNTER — Telehealth: Payer: Self-pay | Admitting: Internal Medicine

## 2013-01-12 NOTE — Telephone Encounter (Signed)
Faxed pt's office notes to [facility].

## 2013-05-18 ENCOUNTER — Ambulatory Visit: Payer: Self-pay | Admitting: Internal Medicine

## 2013-05-30 ENCOUNTER — Inpatient Hospital Stay: Payer: Self-pay | Admitting: Specialist

## 2013-05-30 LAB — URINALYSIS, COMPLETE
Ketone: NEGATIVE
Ph: 5 (ref 4.5–8.0)
RBC,UR: 127 /HPF (ref 0–5)
Squamous Epithelial: 6
WBC UR: 260 /HPF (ref 0–5)

## 2013-05-30 LAB — CBC
HCT: 40 % (ref 35.0–47.0)
HGB: 13.2 g/dL (ref 12.0–16.0)
MCH: 27.7 pg (ref 26.0–34.0)
MCV: 84 fL (ref 80–100)
RBC: 4.77 10*6/uL (ref 3.80–5.20)
WBC: 8.1 10*3/uL (ref 3.6–11.0)

## 2013-05-30 LAB — COMPREHENSIVE METABOLIC PANEL
Alkaline Phosphatase: 127 U/L (ref 50–136)
BUN: 14 mg/dL (ref 7–18)
Bilirubin,Total: 0.5 mg/dL (ref 0.2–1.0)
Calcium, Total: 9.8 mg/dL (ref 8.5–10.1)
EGFR (Non-African Amer.): 50 — ABNORMAL LOW

## 2013-05-30 LAB — MAGNESIUM: Magnesium: 1.6 mg/dL — ABNORMAL LOW

## 2013-05-30 LAB — PROTIME-INR: INR: 5.2

## 2013-05-31 LAB — CBC WITH DIFFERENTIAL/PLATELET
Basophil #: 0 10*3/uL (ref 0.0–0.1)
Basophil %: 0.2 %
Eosinophil #: 0 10*3/uL (ref 0.0–0.7)
HCT: 36 % (ref 35.0–47.0)
Lymphocyte #: 0.6 10*3/uL — ABNORMAL LOW (ref 1.0–3.6)
MCH: 28.5 pg (ref 26.0–34.0)
MCHC: 34.2 g/dL (ref 32.0–36.0)
MCV: 83 fL (ref 80–100)
Neutrophil #: 5.5 10*3/uL (ref 1.4–6.5)
RBC: 4.33 10*6/uL (ref 3.80–5.20)

## 2013-05-31 LAB — BASIC METABOLIC PANEL
BUN: 15 mg/dL (ref 7–18)
Calcium, Total: 9.6 mg/dL (ref 8.5–10.1)
Creatinine: 1.04 mg/dL (ref 0.60–1.30)
EGFR (African American): >60
EGFR (Non-African Amer.): 55 — ABNORMAL LOW
Osmolality: 283 (ref 275–301)
Potassium: 3.4 mmol/L — ABNORMAL LOW (ref 3.5–5.1)
Sodium: 139 mmol/L (ref 136–145)

## 2013-05-31 LAB — MAGNESIUM: Magnesium: 1.7 mg/dL — ABNORMAL LOW

## 2013-05-31 LAB — PROTIME-INR: INR: 1.2

## 2013-06-17 ENCOUNTER — Ambulatory Visit: Payer: Self-pay | Admitting: Diagnostic Neuroimaging

## 2013-06-18 ENCOUNTER — Ambulatory Visit: Payer: Self-pay | Admitting: Internal Medicine

## 2013-06-18 DEATH — deceased

## 2013-06-28 ENCOUNTER — Ambulatory Visit: Payer: Self-pay | Admitting: Diagnostic Neuroimaging

## 2013-07-21 IMAGING — US US EXTREM LOW VENOUS BILAT
1 series · 14 of 24 positions shown · non-contrast
Comparison: none

REASON FOR EXAM: Warmth, swelling. r/o DVT.  has active Metastatic Lung
Cancer. High risk.
COMMENTS:

PROCEDURE:     US  - US DOPPLER LOW EXTR BILATERAL  - May 31, 2013  [DATE]
RESULT:     Comparison: None

[Series 1: us extrem low venous bilat · 0.09mm/px · 14 of 58 slices shown]
[im 1/58]
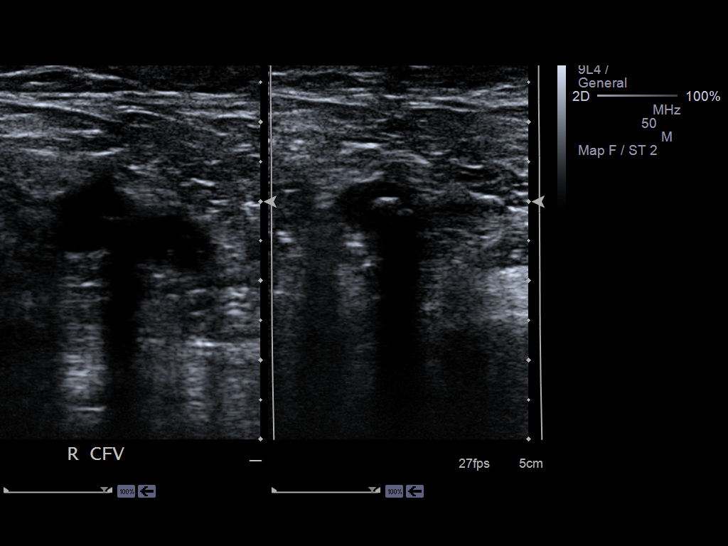
[im 5/58]
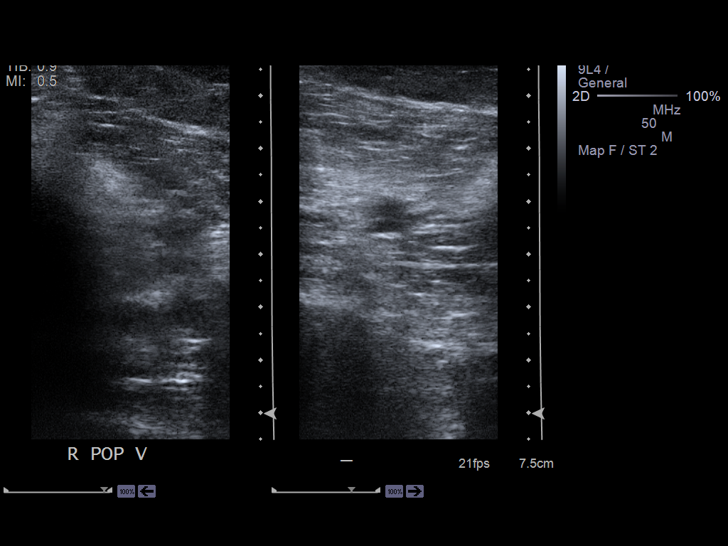
[im 10/58]
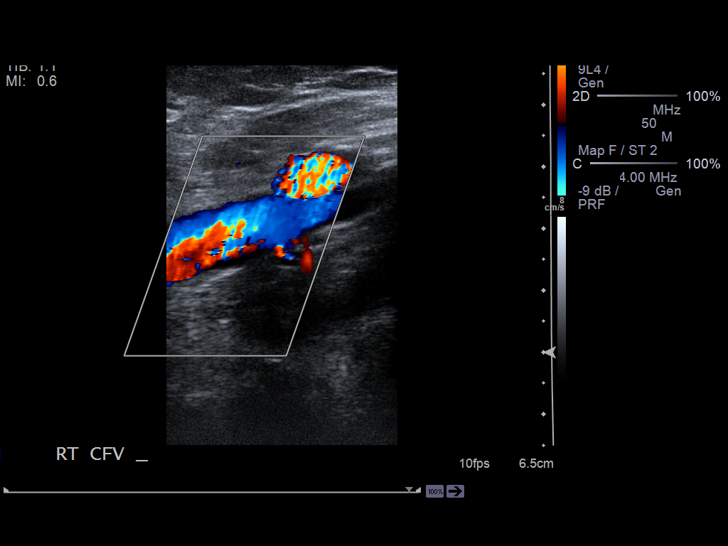
[im 15/58]
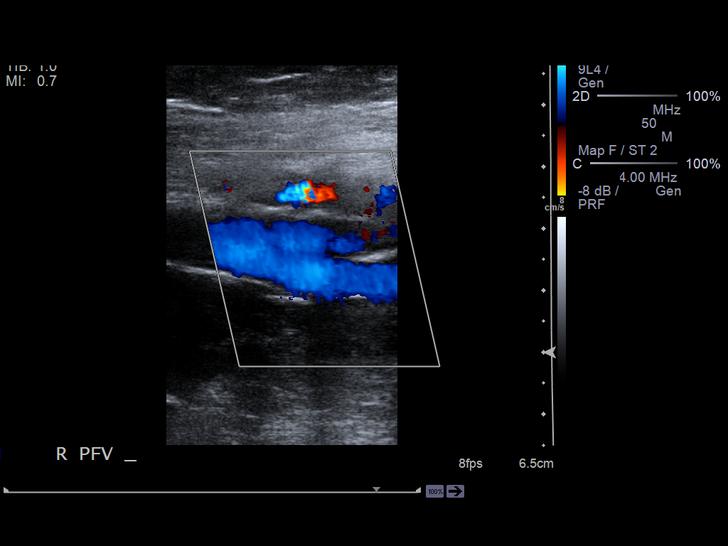
[im 18/58]
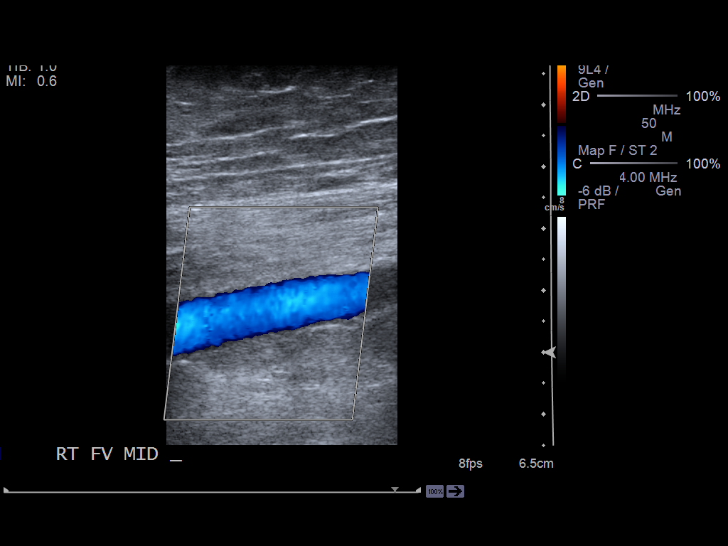
[im 23/58]
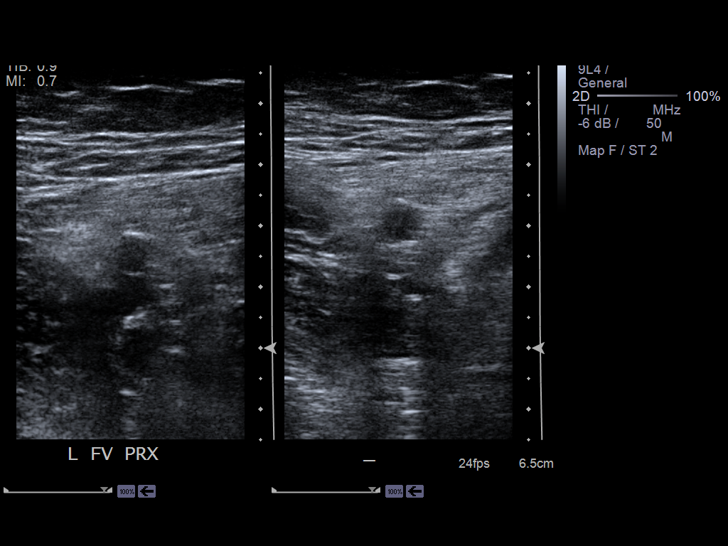
[im 28/58]
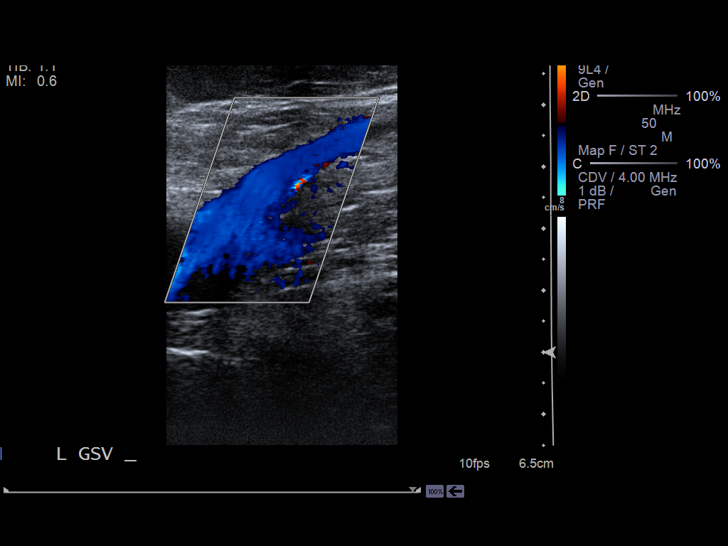
[im 30/58]
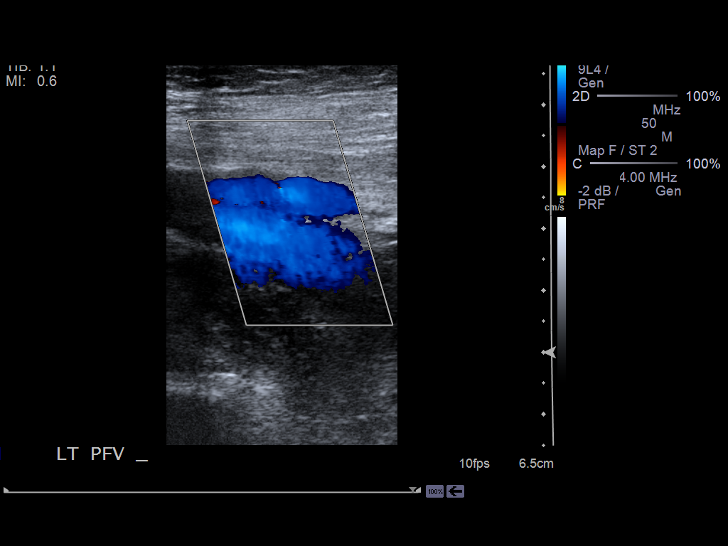
[im 35/58]
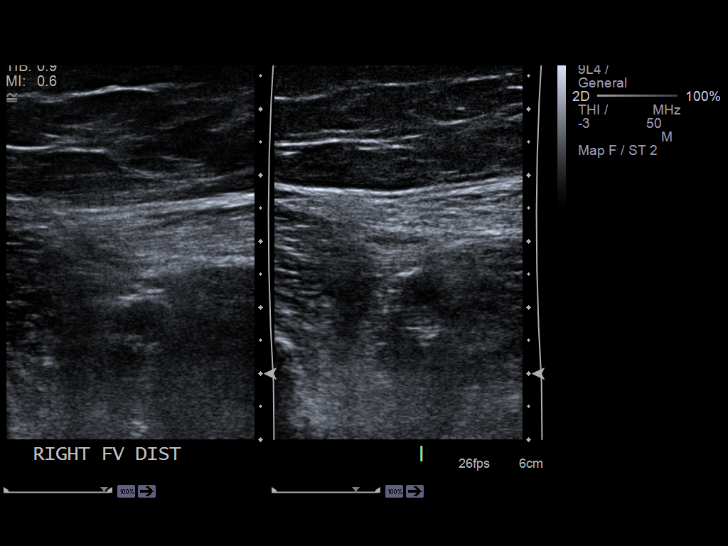
[im 40/58]
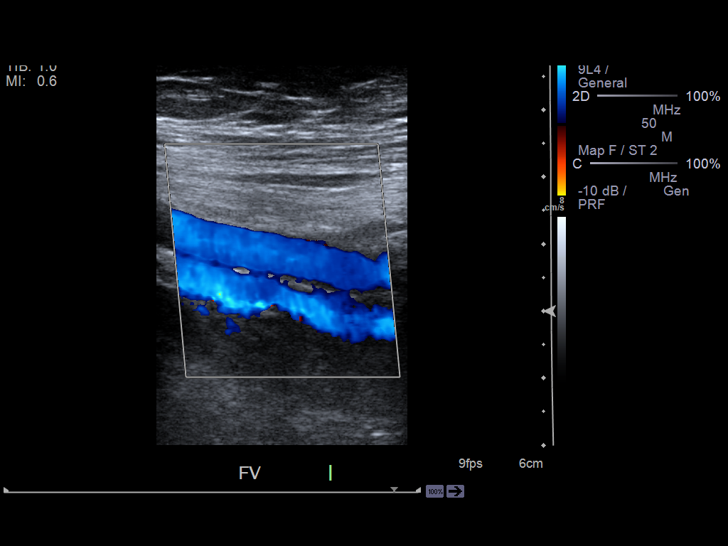
[im 45/58]
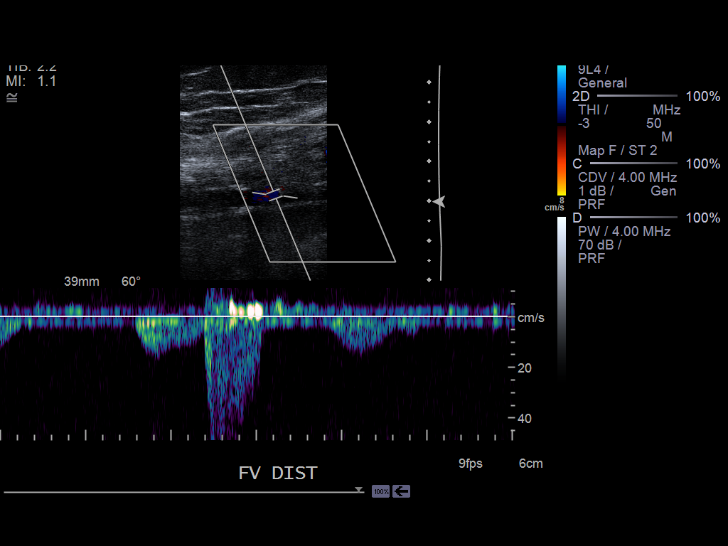
[im 48/58]
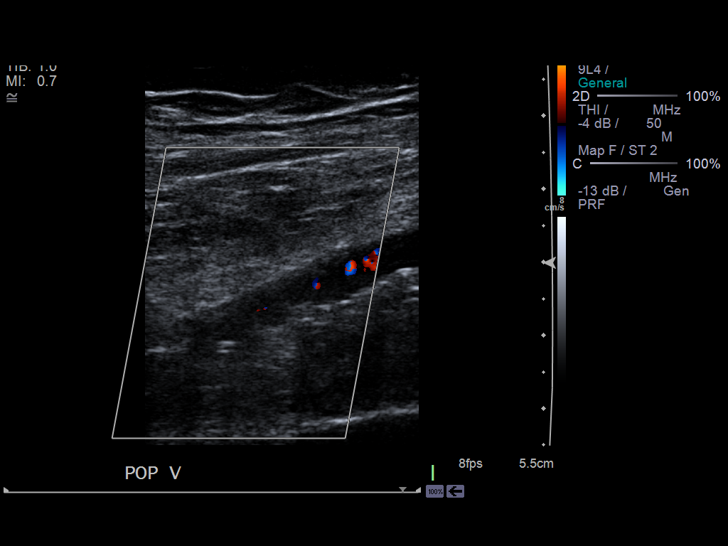
[im 53/58]
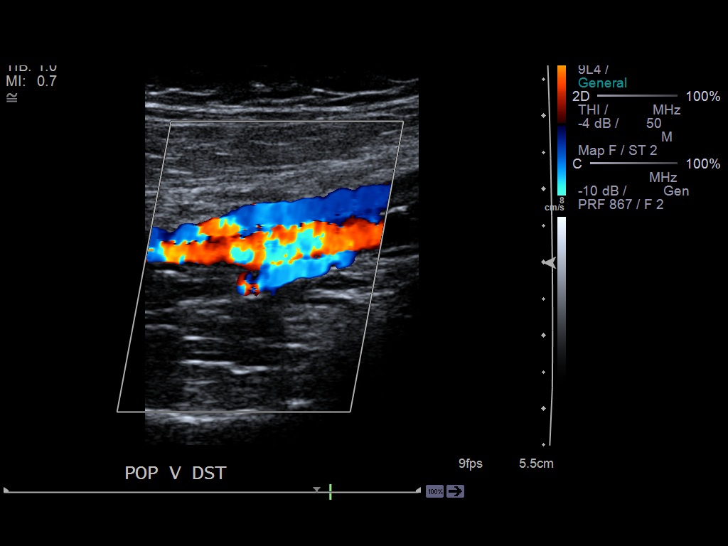
[im 58/58]
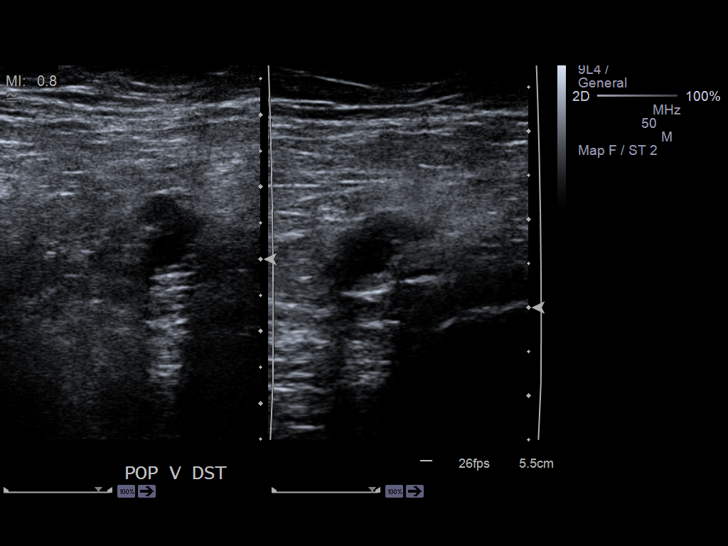

[14 of 24 positions shown; findings below may reference images not displayed]

FINDINGS: Multiple longitudinal and transverse gray-scale as well as color
and spectral Doppler images of the bilateral lower extremity veins were
obtained from the common femoral veins through the popliteal veins.

No evidence of deep venous thrombosis in the right common femoral vein,
femoral vein, or popliteal vein.

There is nonocclusive thrombus in the left popliteal vein. No evidence of
deep venous thrombosis in the left femoral or common femoral veins.
IMPRESSION: 1. Nonocclusive thrombus in the left popliteal vein.
2. No evidence of DVT in the right lower extremity.

The preliminary port was called to the other Sofya by the ultrasound
technologist at 7331 hours 06/01/2011

[REDACTED]

## 2014-07-12 ENCOUNTER — Other Ambulatory Visit: Payer: Self-pay | Admitting: Pharmacist

## 2015-03-10 NOTE — Op Note (Signed)
PATIENT NAME:  Veronica Duarte, Veronica Duarte MR#:  [id number] DATE OF BIRTH:  Nov 21, 1945  DATE OF PROCEDURE:  06/02/2013  PREOPERATIVE DIAGNOSES:  1.  Acute deep venous thrombosis, left lower extremity.  2.  History of deep vein thrombosis with pulmonary embolism.  3.  Stage IV lung carcinoma.  4.  Subarachnoid hemorrhage.   POSTOPERATIVE DIAGNOSES: 1.  Acute deep venous thrombosis, left lower extremity.  2.  History of deep vein thrombosis with pulmonary embolism.  3.  Stage IV lung carcinoma.  4.  Subarachnoid hemorrhage.   PROCEDURE PERFORMED:  1.  Inferior venacavogram.  2.  Placement of infrarenal inferior vena caval filter, Meridian type.   SURGEON: Hortencia Pilar, M.D.   SEDATION: Versed 2 mg IV.   ACCESS: Right common femoral vein.   CONTRAST USED: Isovue 15 mL.   FLUOROSCOPY TIME: 0.5 minutes.   INDICATIONS: [name] is a 69 year old woman, who has multiple medical problems and presented with neurologic changes and was found to have subarachnoid hemorrhage. She was being maintained on Coumadin secondary to deep venous thrombosis and PE. While in the hospital, she was noted to have increased swelling and tenderness of her left leg. Duplex ultrasound demonstrated acute DVT in the popliteal vein and she is therefore undergoing filter placement as she is not an anticoagulation candidate. Risks and benefits were reviewed in great detail with the patient's daughter, who is the power of attorney. All questions were answered. The patient agrees to proceed. Daughter agrees for the patient to proceed as well.   DESCRIPTION OF PROCEDURE: The patient is taken to special procedures and placed in the supine position. After adequate sedation with Versed is obtained, right groin is prepped and draped in a sterile fashion. Ultrasound is placed in a sterile sleeve. The femoral vein is identified. It is echolucent and compressible indicating patency. Images recorded for the permanent record. After 1%  lidocaine infiltrating soft tissues, access to the vein is obtained under direct ultrasound visualization with a Seldinger needle. A J-wire is advanced without difficulty.   A small nick is made at the wire insertion site and the delivery sheath is advanced over the wire without difficulty. It was positioned at the confluence of the iliac veins and contrast injection is utilized to image the inferior vena cava. After review of the images, wire is reinserted, sheath is positioned at the level of L2 and the dilator and wire are removed. Filter is then deployed at the level of L2 without difficulty. The filter is in excellent orientation. The sheath is pulled, pressure held and there are no immediate complications.   INTERPRETATION: Image of the inferior vena cava demonstrates the cava is widely patent. There are no filling defects or suggestion of thrombus within the infrarenal portion. Renal veins are identified at the L1 level. The cava measures approximately 24 mm in diameter. Meridian filter is deployed with an excellent orientation.    ____________________________ Katha Cabal, MD ggs:aw D: 06/02/2013 08:27:34 ET T: 06/02/2013 09:11:48 ET JOB#: [id number]  cc: Katha Cabal, MD, <Dictator> Katha Cabal MD ELECTRONICALLY SIGNED 06/05/2013 11:14

## 2015-03-10 NOTE — Consult Note (Signed)
Brief Consult Note: Diagnosis: DVT PE lung CA SAH.   Patient was seen by consultant.   Recommend further assessment or treatment.   Comments: will evaluate the duplex with Radiology if the clot on todays scan appears chronic then the thrombus has been adequately treated.  Electronic Signatures: Hortencia Pilar (MD)  (Signed 14-Jul-14 18:09)  Authored: Brief Consult Note   Last Updated: 14-Jul-14 18:09 by Hortencia Pilar (MD)

## 2015-03-10 NOTE — Consult Note (Signed)
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 She also has a baseline dementia.  There is a history of PE and DVT on Coumadin since July of last year.  She was found on the floor yesterday morning.   In the ER, they did a CT scan of the head which showed a small amount of subarachnoid hemorrhage. INR was found to be 5.2. Today her left leg was noted to be swollen and mildly tender and an ultrasound was obtained which is reported as DVT in the popliteal vein.  PAST MEDICAL HISTORY: Stage IV lung cancer, non-small cell metastatic to liver followed by Hospice, dementia, PE and DVT, on Coumadin since July of last year, chronic respiratory failure secondary to COPD, history of atrial fibrillation.   PAST SURGICAL HISTORY: CABG after a perforated vessel with a cardiac cath, a wedge resection of the lung.   Home Medications: Medication Instructions Status  Bystolic 20 mg oral tablet 1 tab(s) orally once a day Active  Crestor 20 mg oral tablet 1 tab(s) orally once a day (at bedtime) Active  Namenda XR 14 mg oral capsule, extended release 1 cap(s) orally once a day. *do not crush or chew* Active  folic acid 1 mg oral tablet 1 tab(s) orally once a day Active  Vitamin C 500 mg oral tablet 1 tab(s) orally 2 times a day Active  Wellbutrin SR 150 mg/12 hours oral tablet, extended release 1 tab(s) orally once a day (in the morning) Active  Cerovite Advanced Formula Therapeutic Multiple Vitamins with Minerals oral tablet 1 tab(s) orally once a day (in the morning) with food. Active  cetirizine 10 mg oral tablet 1 tab(s) orally once a day Active  Diltiazem Hydrochloride CD 120 mg/24 hours oral capsule, extended release 1 cap(s) orally once a day Active  DULoxetine 60 mg oral delayed release capsule 1 cap(s) orally once a day Active  Mapap 500 mg oral tablet 1 tab(s) orally 3 times a  day. *max dose of acetaminophen is 3,000mg /24 hrs from all sources* Active  potassium chloride 20 mEq oral tablet, extended release 1 tab(s) orally once a day. *do not crush or chew* Active  Spiriva 18 mcg inhalation capsule 1 cap(s) via handihaler once a day Active  Symbicort 160 mcg-4.5 mcg/inh inhalation aerosol 2 puff(s) inhaled 2 times a day Active  warfarin 2.5 mg oral tablet 1 tab(s) orally once a day Active  Imodium A-D 2 mg oral tablet 1 tab(s) orally with each loose stool as needed for diarrhea. *not to exceed 8 doses in 24 hours* Active  Iophen NR 2 teaspoonsful (10 milliliters) orally every 6 hours, As Needed for cough. *not to exceed 4 doses* Active  Maalox Advanced Maximum Strength 400 mg-400 mg-40 mg/5 mL oral suspension 2 tablespoonsful (30 milliliters) orally up to 4 times a day as needed for Indigestion, Heartburn. *not to exceed 4 doses* Active  Mapap 500 mg oral tablet 1 tab(s) orally every 4 hours as needed for fever up to 101, headache and or minor discomfort. (dose not to exceed 2000mg ) *max dose of acetaminophen is 3000mg /24 hrs from all sourced* Active  Mucinex 600 mg oral tablet, extended release 2 tabs (1200mg ) orally 2 times a day as needed for cough. Active  Milk of Magnesia 8% oral suspension 30 milliliter(s) orally once a day (at bedtime), As Needed - for Constipation Active  prochlorperazine 10 mg  oral tablet 1 tab(s) orally every 6 hours as needed for nausea and vomiting. Active  Ventolin HFA CFC free 90 mcg/inh inhalation aerosol 2 puff(s) inhaled every 6 hours as needed for shortness of breath. Active  oxygen oxygen @ 2 to 4 liter(s) via nasal cannula as needed for shortness of breath Active    No Known Allergies:   Case History:  Family History Non-Contributory   Social History negative tobacco, negative ETOH, negative Illicit drugs   Review of Systems:  ROS Pt not able to provide ROS  sleepy and confused   Physical Exam:  GEN well developed, well  nourished, no acute distress   HEENT hearing intact to voice, dry oral mucosa   NECK supple  trachea midline   RESP normal resp effort  no use of accessory muscles   CARD regular rate  no JVD   ABD soft  nondistneded   EXTR negative cyanosis/clubbing, positive edema, left leg mild redness and mild tenderness   SKIN positive rashes, skin turgor good   NEURO positive tremor, motor/sensory function intact   PSYCH poor insight, lethargic   Nursing/Ancillary Notes: **Vital Signs.:   14-Jul-14 04:44  Vital Signs Type Routine  Temperature Temperature (F) 97.9  Celsius 36.6  Temperature Source oral  Pulse Pulse 89  Respirations Respirations 21  Systolic BP Systolic BP 782  Diastolic BP (mmHg) Diastolic BP (mmHg) 81  Mean BP 101  Pulse Ox % Pulse Ox % 93  Pulse Ox Activity Level  At rest  Oxygen Delivery 2L   Routine Chem:  14-Jul-14 04:04   Magnesium, Serum  1.7 (1.8-2.4 THERAPEUTIC RANGE: 4-7 mg/dL TOXIC: > 10 mg/dL  -----------------------)  Glucose, Serum  190  BUN 15  Creatinine (comp) 1.04  Sodium, Serum 139  Potassium, Serum  3.4  Chloride, Serum 103  CO2, Serum 31  Calcium (Total), Serum 9.6  Anion Gap  5  Osmolality (calc) 283  eGFR (African American) >60  eGFR (Non-African American)  55 (eGFR values <70m/min/1.73 m2 may be an indication of chronic kidney disease (CKD). Calculated eGFR is useful in patients with stable renal function. The eGFR calculation will not be reliable in acutely ill patients when serum creatinine is changing rapidly. It is not useful in  patients on dialysis. The eGFR calculation may not be applicable to patients at the low and high extremes of body sizes, pregnant women, and vegetarians.)  Routine Coag:  14-Jul-14 04:04   Prothrombin  15.2  INR 1.2 (INR reference interval applies to patients on anticoagulant therapy. A single INR therapeutic range for coumarins is not optimal for all indications; however, the suggested range  for most indications is 2.0 - 3.0. Exceptions to the INR Reference Range may include: Prosthetic heart valves, acute myocardial infarction, prevention of myocardial infarction, and combinations of aspirin and anticoagulant. The need for a higher or lower target INR must be assessed individually. Reference: The Pharmacology and Management of the Vitamin K  antagonists: the seventh ACCP Conference on Antithrombotic and Thrombolytic Therapy. CNFAOZ.3086Sept:126 (3suppl): [id number] A HCT value >55% may artifactually increase the PT.  In one study,  the increase was an average of 25%. Reference:  "Effect on Routine and Special Coagulation Testing Values of Citrate Anticoagulant Adjustment in Patients with High HCT Values." American Journal of Clinical Pathology 2006;126:400-405.)  Routine Hem:  14-Jul-14 04:04   WBC (CBC) 6.1  RBC (CBC) 4.33  Hemoglobin (CBC) 12.3  Hematocrit (CBC) 36.0  Platelet Count (CBC) 282  MCV 83  MCH 28.5  MCHC 34.2  RDW 13.8  Neutrophil % 88.9  Lymphocyte % 9.6  Monocyte % 1.2  Eosinophil % 0.1  Basophil % 0.2  Neutrophil # 5.5  Lymphocyte #  0.6  Monocyte #  0.1  Eosinophil # 0.0  Basophil # 0.0 (Result(s) reported on 31 May 2013 at 04:21AM.)   [place]:    14-Jul-14 15:43, [place] Color Flow Doppler Low Extrem Bilat (Legs)  [place] Color Flow Doppler Low Extrem Bilat (Legs)   REASON FOR EXAM:    Warmth, swelling. r/o DVT.  has active Metastatic   Lung Cancer. High risk.  COMMENTS:       PROCEDURE: [place]  - US DOPPLER LOW EXTR BILATERAL  - May 31 2013  3:43PM     RESULT: Comparison: None    Findings: Multiple longitudinal andtransverse gray-scale as well as   color and spectral Doppler images of the bilateral lower extremity veins   were obtained from the common femoral veins through the popliteal veins.    No evidence of deep venous thrombosis in the right common femoral vein,   femoral vein, or popliteal vein.  There is nonocclusive thrombus in the left  popliteal vein. No evidence of   deep venous thrombosis in the left femoral or common femoral veins.    IMPRESSION:   1. Nonocclusive thrombus in the left poplitealvein.  2. No evidence of DVT in the right lower extremity.    The preliminary port was called to the other [place] by the ultrasound   technologist at 1550 hours 06/01/2011    Dictation site: 2        Verified By: Gregor Hams, M.D., MD    Impression 1.  DVT left leg in a patient with SAH therefore no anticoagulation is feasible at this time.  It does seem to be unusual to have acute clot given a year of anticoagulation.  Still she was reversed yesterday and does have a hypercoagulable state with the lung cancer, she had been in bed.  I will plan to review the scan with radiology and if chronic thrombus is noted then no further treatement is needed.  However, if the clot appears acute then a filter would be indicated if the family wishes to proceed. 2.  Subarachnoid hemorrhage after a fall with coagulopathy secondary to Coumadin and high INR:  yesterday the following was ordered a stat 2 units of FFP and stat IV vitamin K. Benefits and risks of blood product explained, all questions answered. The patient is a DNR. I will get a palliative care consultation, repeat CT scan of the head in the a.m. to evaluate for further bleeding. The patient and patient's family wished nonaggressive treatment and no neurosurgical evaluation, but they did wish to reverse the Coumadin and monitor her closely.  3.  Stage IV lung cancer metastatic to liver: May be a candidate for Hospice Home, followed by Hospice already. We will get a palliative care consultation.  4.  History of PE and DVT: I agree with not anticoagulating this at this time secondary to the subarachnoid hemorrhage and coagulopathy. The patient is a high risk for PE with immobility.  5.  Dementia:  Continue Namenda.  6.  Hypomagnesemia:  IV replacement of magnesium, recheck in the a.m.    Plan level 4   Electronic Signatures: Hortencia Pilar (MD)  (Signed 16-Jul-14 10:28)  Authored: General Aspect/Present Illness, Home Medications, Allergies, History and Physical Exam, Vital Signs, Labs, Radiology, Impression/Plan   Last Updated:  16-Jul-14 10:28 by Hortencia Pilar (MD)

## 2015-03-10 NOTE — H&P (Signed)
PATIENT NAME:  Veronica Duarte, Veronica Duarte MR#:  [id number] DATE OF BIRTH:  11/25/1945  DATE OF ADMISSION:  05/30/2013  PRIMARY CARE PHYSICIAN: [facility]   CHIEF COMPLAINT: Brought in with a fall.   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 She also has a little dementia and history of PE and DVT on Coumadin since July of last year, chronic respiratory failure with COPD.  She cannot give me any history about what happened with the fall. She does have a bruise on her left side of the head and right hand. She was found on the floor this morning.  This is her third fall in the past few months. She has had a steady decline over the past month, not getting up out of bed and not eating, and oxygen was added. The patient does feel a little sleepy. She has had some weight loss from 200 down to 162. In the ER, they did a CT scan of the head which showed a small amount of subarachnoid hemorrhage. INR was found to be 5.2. I had a long discussion with family at the bedside. They do not want aggressive management with regards to any surgical intervention. I told them that I do not have neurosurgical services here and most of the time no neurological services here. They understand and would like to be admitted here.   PAST MEDICAL HISTORY: Stage IV lung cancer, non-small cell metastatic to liver followed by Hospice, dementia, PE and DVT, on Coumadin since July of last year, chronic respiratory failure secondary to COPD, history of atrial fibrillation.   PAST SURGICAL HISTORY: CABG after a perforated vessel with a cardiac cath, a wedge resection of the lung.   ALLERGIES: No known drug allergies.   MEDICATIONS: As per Prescription writer include: Bystolic 20 mg daily, Cerovite  multivitamin, Zyrtec 10 mg daily, Crestor 20 mg daily, diltiazem 120 mg extended-release daily,  Cymbalta 60 mg daily, folic acid 1 mg daily, Imodium AD p.r.n., cough syrup p.r.n., Maalox p.r.n., Tylenol 3 times a day 500 mg, milk of magnesia p.r.n., Mucinex p.r.n., Namenda XR 14 mg daily, oxygen 2 to 4 liters, potassium chloride 20 mEq daily, prochlorperazine 10 mg every 6 hours as needed for nausea, vomiting, Spiriva 1 inhalation daily, Symbicort 160/4.5, 2 puffs twice a day, Ventolin HFA CFC 1 puff every 6 hours as needed for shortness of breath, vitamin C 500 mg twice a day, warfarin 2.5 mg daily, Wellbutrin SR 150 mg daily.   SOCIAL HISTORY: Lives at [facility]. Quit smoking 9 to 10 years ago. No alcohol. No drug use. Used to work in [employer].   FAMILY HISTORY: Mother died of an MI at age 71.   REVIEW OF SYSTEMS:  CONSTITUTIONAL: Positive for weight loss from 200 down to 162. No fever, chills or sweats. No weakness one side versus the other, but does feel sleepy.  EYES: She does wear glasses.  EARS, NOSE, MOUTH, AND THROAT: No hearing loss. No sore throat. No difficulty swallowing.  CARDIOVASCULAR: No chest pain. No palpitations.  RESPIRATORY: Positive for shortness of breath. Positive for cough. No sputum. No hemoptysis.  GASTROINTESTINAL: No nausea. No vomiting. No abdominal pain. Positive for diarrhea. No bright red blood per rectum. No melena.  GENITOURINARY: No burning on urination. No hematuria.  MUSCULOSKELETAL: No joint pain or muscle pain.  INTEGUMENT: No rashes or eruptions.  NEUROLOGIC: Positive for fall but no fainting or blackouts.  PSYCHIATRIC: On medication for depression.  ENDOCRINE: No thyroid problems.  HEMATOLOGIC/LYMPHATIC: No anemia.   PHYSICAL EXAMINATION: VITAL SIGNS: Temperature 99.5, pulse 85, respirations 24, blood pressure 123/62.  Pulse ox dropped down to 82% on room air.  GENERAL: Positive respiratory distress with audible wheeze.  HEENT: Eyes: Conjunctivae and lids normal. Pupils equal, round, and reactive to light. Extraocular muscles intact. No  nystagmus. Ears, nose, mouth, and throat: Tympanic membrane bulging, slight erythema. Nasal mucosa: No erythema. Throat: No erythema, no exudate seen. Lips and gums: No lesions.  NECK: No JVD. No bruits. No lymphadenopathy. No thyromegaly. No thyroid nodules palpated.  RESPIRATORY: Positive use of accessory muscles to breathe. Audible wheeze heard even without the stethoscope, wheeze throughout entire lung field.  CARDIOVASCULAR: S1, S2 normal. No gallops, rubs or murmurs heard. Carotid upstroke 2+ bilaterally. No bruits.  EXTREMITIES: Dorsalis pedis pulses 2+ bilaterally. Trace edema of the lower extremity.  ABDOMEN: Soft, nontender. No organomegaly/splenomegaly. Normoactive bowel sounds. No masses felt.  LYMPHATIC: No lymph nodes in the neck.  MUSCULOSKELETAL: No clubbing. Trace edema. No cyanosis.  SKIN: No rashes or ulcers seen.  NEUROLOGIC: Cranial nerves II through XII grossly intact. Deep tendon reflexes 2+ bilateral lower extremity. Babinski positive bilaterally. Power 5 out of 5 upper and lower extremities.  PSYCHIATRIC: The patient is alert, oriented to person and place, sometimes a little confused with some answers, does feel very sleepy.   LABORATORY AND RADIOLOGICAL DATA:  PTT 96.5. Glucose 99, BUN 14, creatinine 1.12, sodium 137, potassium 4.0, chloride 103, CO2  30, calcium 9.8. Liver function tests: Albumin low at 2.9. Other liver function tests normal. White blood cell count 8.1, H and H 13.2 and 40.0, platelet count of 329, magnesium 1.6, INR 5.2.  Chest x-ray: Increased density lower half of the right hemithorax. Elevation of the right hemidiaphragm, underlying infiltrate, atelectasis versus mass.  CT scan of the cervical spine: No acute bony abnormality.  CT scan of the head showed a small amount of subarachnoid hemorrhage, atrophy with chronic microvascular ischemic disease, old right thalamic lacunar infarct.   ASSESSMENT AND PLAN: 1.  Subarachnoid hemorrhage after a fall  with coagulopathy secondary to Coumadin and high INR:  I ordered a stat 2 units of FFP and stat IV vitamin K. Benefits and risks of blood product explained, all questions answered. The patient is a DNR. I will get a palliative care consultation, repeat CT scan of the head in the a.m. to evaluate for further bleeding. The patient and patient's family wished nonaggressive treatment and no neurosurgical evaluation, but they did wish to reverse the Coumadin and monitor her closely. They are not sure if she will be able to go back to the [facility], and I will get a palliative care consultation to see if she is a candidate for Hospice Home or a facility with hospice.  2.  Acute on chronic respiratory failure with chronic obstructive pulmonary disease exacerbation:  We will give stat IV Solu-Medrol, ceftriaxone and Zithromax  and nebulizer treatments, 1 dose of Lasix and continue her inhalers.  3.  Stage IV lung cancer metastatic to liver: May be a candidate for Hospice Home, followed by Hospice already. We will get a palliative care consultation.  4.  History of PE: I am not going to treat this at this time secondary to the subarachnoid hemorrhage and coagulopathy. The patient is a high risk for PE with immobility.  5.  Dementia:  Continue Namenda.  6.  Hypomagnesemia:  IV replacement of magnesium, recheck in the a.m.  7.  History of atrial fibrillation:  On Bystolic and Cardizem.   TIME SPENT ON ADMISSION:  One hour critical care time.   ____________________________ Tana Conch. Leslye Peer, MD [id number] D: 05/30/2013 16:27:45 ET T: 05/30/2013 17:23:29 ET JOB#: [id number]  cc: Tana Conch. Leslye Peer, MD, <Dictator> [facility] [place] MD ELECTRONICALLY SIGNED 06/01/2013 19:16

## 2015-03-10 NOTE — Discharge Summary (Signed)
PATIENT NAME:  ARLYN, BUMPUS MR#:  [id number] DATE OF BIRTH:  [birth year]  DATE OF ADMISSION:  05/30/2013 DATE OF DISCHARGE:  06/02/2013   For a detailed note, please take a look at the history and physical on the admission by Dr. Loletha Grayer.   DIAGNOSES AT DISCHARGE: As follows: 1. Subarachnoid bleed, status post mechanical fall.  2. Subacute left lower extremity deep vein thrombosis.  3. Chronic obstructive pulmonary disease exacerbation.  4. Stage IV lung cancer.  5. Dementia/depression.   DIET: The patient is being discharged on a low sodium, low fat diet.   ACTIVITY: As tolerated.   FOLLOWUP: [facility].   DISPOSITION: The patient is being discharged to assisted living facility at [facility] with hospice services.   DISCHARGE MEDICATIONS:  1. Bystolic 20 mg daily.  2. Crestor 20 mg daily. 3. Namenda XR 14 mg daily. 4. Folic acid 1 mg daily.  5. Vitamin C 500 mg b.i.d. 6. Wellbutrin SR 150 mg daily.  7. Cetirizine 10 mg daily. 8. Cardizem CD 120 mg daily. 9. Cymbalta 60 mg daily.  10. Potassium 20 mEq daily. 11. Spiriva 1 puff daily. 12. Symbicort 160/4.5 two puffs b.i.d.  13. Imodium AD as needed. 14. Maalox 30 mL q.4 hours as needed for indigestion.  15. Tylenol 500 mg q.4 hours as needed for fever or pain.  16. Milk of Magnesia 30 mL at bedtime as needed for constipation. 17. Prochlorperazine 10 mg q.6 hours as needed for nausea and vomiting.  18. Albuterol inhaler 2 puffs q.6 hours as needed.  19. Oxygen 2 to 4 liters nasal cannula continuously.  20. Prednisone taper starting at 60 mg, down to 10 mg over the next 6 days. 21. Roxanol 0.25 mL to 0.5 mL 1 to 2 hours as needed for pain or dyspnea.   CONSULTANTS DURING HOSPITAL COURSE: Dr. Hortencia Pilar from vascular surgery, Dr. Izora Gala Phifer of palliative care.   PERTINENT STUDIES DONE DURING THE HOSPITAL COURSE: A CT scan of the head done without contrast on admission showing a small amount  of subarachnoid hemorrhage, atrophy and chronic microvascular ischemic disease. CT of the cervical spine showing no evidence of any acute bony abnormality. The patient had a repeat CT scan of the head done without contrast on July 14th which showed the subarachnoid bleed has become better defined in the quadrigeminal plate cisterns as well as blood within the posterior horns of the lateral ventricles. A small amount of parenchymal and subarachnoid blood in the right frontal region is also better defined. There is no evidence of increased intracranial pressure. No evolving infarction. No acute skull fracture. The patient had an ultrasound of the lower extremities showing nonocclusive DVT of the left popliteal vein.   HOSPITAL COURSE: This is a 69 year old female who presented to the hospital from assisted after a fall and CT scan showing a subarachnoid bleed.   1. Subarachnoid bleed. The patient was admitted to the hospital and observed. The patient has underlying stage IV lung cancer. Therefore, family did not want to pursue aggressive intervention for the subarachnoid bleed. She had a repeat CT of the head done the day after admission which showed no worsening of the subarachnoid bleed. The patient actually came in on Coumadin with INR of 5.7. She received some vitamin K and FFP, and INR is now normalized. She did not have any significant change in her mental status, and she is currently at baseline now.  2. History of COPD with stage IV  lung cancer. The patient had a mild COPD exacerbation and therefore was started on IV steroids, continued her Symbicort, Spiriva and around-the-clock nebulizer treatments. She was seen by palliative care, and Roxanol was also added for air hunger. Presently, her shortness of breath and COPD exacerbation have improved. She currently is being discharged on a prednisone taper along with her DuoNebs and maintenance of her inhalers, including Symbicort and Spiriva, and Roxanol has  also been added for air hunger.  3. History of DVT and PE. The patient presented to the hospital on Coumadin, but it was stopped due to subarachnoid bleed. She has a history of a DVT and PE in the past, and her left lower extremity was somewhat warm during the hospital course. She underwent an ultrasound of the lower extremities, which showed a nonocclusive DVT of the popliteal on the left. A vascular surgery consult was obtained, and after they evaluated the patient, the patient was thought to have a subacute DVT and not an acute DVT. The patient is a high risk for anticoagulation given her subarachnoid bleed and frequent falls and therefore underwent an IVC filter placement done by vascular on July 16th. She tolerated the procedure well, and she currently is not on any anticoagulants presently.  4. Chronic atrial fibrillation. The patient remained rate controlled on her Bystolic and Cardizem, which she will continue. She was on Coumadin, but it has been discontinued due to high fall risk and her subarachnoid bleed, as stated.  5. Dementia. The patient was maintained on her Namenda. She will resume that.  6. Depression. The patient was maintained on her Cymbalta, and she will also resume that upon discharge.   CODE STATUS: The patient is a DNI/DNR.   DISPOSITION: She is being discharged back to her assisted living with hospice services. The family is in agreement with this plan.   TIME SPENT: 40 minutes.   ____________________________ Belia Heman. Verdell Carmine, MD [id number] D: 06/02/2013 11:55:01 ET T: 06/02/2013 12:24:15 ET JOB#: [id number]  cc: Belia Heman. Verdell Carmine, MD, <Dictator> Henreitta Leber MD ELECTRONICALLY SIGNED 06/07/2013 14:45
